# Patient Record
Sex: Female | Born: 1940 | Race: White | Hispanic: No | Marital: Single | Smoking: Never smoker
Health system: Southern US, Community
[De-identification: ages and names within clinical notes are randomized; demographics above are authoritative.]

## PROBLEM LIST (undated history)

## (undated) DIAGNOSIS — R001 Bradycardia, unspecified: Secondary | ICD-10-CM

## (undated) DIAGNOSIS — I10 Essential (primary) hypertension: Secondary | ICD-10-CM

## (undated) DIAGNOSIS — R011 Cardiac murmur, unspecified: Secondary | ICD-10-CM

## (undated) DIAGNOSIS — I739 Peripheral vascular disease, unspecified: Secondary | ICD-10-CM

## (undated) DIAGNOSIS — I2510 Atherosclerotic heart disease of native coronary artery without angina pectoris: Secondary | ICD-10-CM

## (undated) DIAGNOSIS — K649 Unspecified hemorrhoids: Secondary | ICD-10-CM

## (undated) DIAGNOSIS — C801 Malignant (primary) neoplasm, unspecified: Secondary | ICD-10-CM

## (undated) DIAGNOSIS — K5792 Diverticulitis of intestine, part unspecified, without perforation or abscess without bleeding: Secondary | ICD-10-CM

## (undated) DIAGNOSIS — K219 Gastro-esophageal reflux disease without esophagitis: Secondary | ICD-10-CM

## (undated) DIAGNOSIS — G459 Transient cerebral ischemic attack, unspecified: Secondary | ICD-10-CM

## (undated) DIAGNOSIS — E039 Hypothyroidism, unspecified: Secondary | ICD-10-CM

## (undated) DIAGNOSIS — M1990 Unspecified osteoarthritis, unspecified site: Secondary | ICD-10-CM

## (undated) HISTORY — PX: WRIST SURGERY: SHX841

## (undated) HISTORY — PX: COLON SURGERY: SHX602

## (undated) HISTORY — PX: ABDOMINAL HYSTERECTOMY: SHX81

## (undated) HISTORY — PX: REPLACEMENT TOTAL KNEE: SUR1224

## (undated) HISTORY — PX: BACK SURGERY: SHX140

## (undated) HISTORY — PX: EYE SURGERY: SHX253

## (undated) HISTORY — PX: CHOLECYSTECTOMY: SHX55

## (undated) HISTORY — PX: OTHER SURGICAL HISTORY: SHX169

## (undated) HISTORY — PX: RECTOCELE REPAIR: SHX761

---

## 2008-05-20 DIAGNOSIS — I10 Essential (primary) hypertension: Secondary | ICD-10-CM | POA: Insufficient documentation

## 2009-09-16 DIAGNOSIS — J329 Chronic sinusitis, unspecified: Secondary | ICD-10-CM | POA: Insufficient documentation

## 2014-02-15 DIAGNOSIS — K219 Gastro-esophageal reflux disease without esophagitis: Secondary | ICD-10-CM | POA: Insufficient documentation

## 2014-03-01 DIAGNOSIS — M205X2 Other deformities of toe(s) (acquired), left foot: Secondary | ICD-10-CM | POA: Insufficient documentation

## 2014-07-10 DIAGNOSIS — Z96652 Presence of left artificial knee joint: Secondary | ICD-10-CM | POA: Insufficient documentation

## 2014-09-05 DIAGNOSIS — G8929 Other chronic pain: Secondary | ICD-10-CM | POA: Insufficient documentation

## 2015-03-01 DIAGNOSIS — Z6825 Body mass index (BMI) 25.0-25.9, adult: Secondary | ICD-10-CM | POA: Insufficient documentation

## 2015-09-14 DIAGNOSIS — Z96651 Presence of right artificial knee joint: Secondary | ICD-10-CM | POA: Insufficient documentation

## 2016-05-07 DIAGNOSIS — K5732 Diverticulitis of large intestine without perforation or abscess without bleeding: Secondary | ICD-10-CM | POA: Insufficient documentation

## 2017-05-28 DIAGNOSIS — K5900 Constipation, unspecified: Secondary | ICD-10-CM | POA: Insufficient documentation

## 2017-07-05 DIAGNOSIS — R7309 Other abnormal glucose: Secondary | ICD-10-CM | POA: Insufficient documentation

## 2017-07-05 DIAGNOSIS — J019 Acute sinusitis, unspecified: Secondary | ICD-10-CM | POA: Insufficient documentation

## 2017-07-05 DIAGNOSIS — K921 Melena: Secondary | ICD-10-CM | POA: Insufficient documentation

## 2017-07-05 DIAGNOSIS — K5792 Diverticulitis of intestine, part unspecified, without perforation or abscess without bleeding: Secondary | ICD-10-CM | POA: Insufficient documentation

## 2017-07-05 DIAGNOSIS — B372 Candidiasis of skin and nail: Secondary | ICD-10-CM | POA: Insufficient documentation

## 2017-07-06 DIAGNOSIS — J209 Acute bronchitis, unspecified: Secondary | ICD-10-CM | POA: Insufficient documentation

## 2017-07-06 DIAGNOSIS — R140 Abdominal distension (gaseous): Secondary | ICD-10-CM | POA: Insufficient documentation

## 2017-10-08 DIAGNOSIS — L309 Dermatitis, unspecified: Secondary | ICD-10-CM | POA: Insufficient documentation

## 2017-12-15 DIAGNOSIS — K5909 Other constipation: Secondary | ICD-10-CM | POA: Insufficient documentation

## 2018-03-09 IMAGING — US VENOUS DOPPLER ULTRASOUND OF LEFT LOWER EXTREMITY
1 series · 13 of 24 positions shown · non-contrast
Comparison: None.

CLINICAL DATA: Left calf pain for the past 10 days. History of
melanoma. History of prior DVT. Evaluate for acute or chronic DVT.



[Series 1: venous doppler ultrasound of left lower extremity · 0.08mm/px · 13 of 40 slices shown]
[im 1/40]
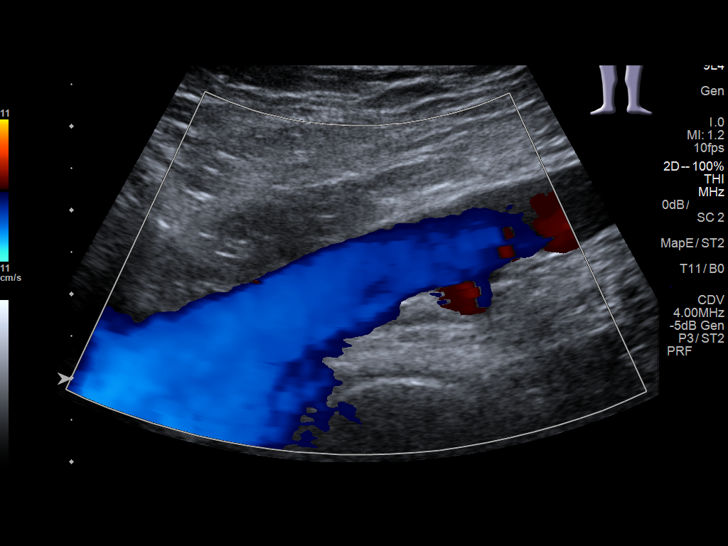
[im 4/40]
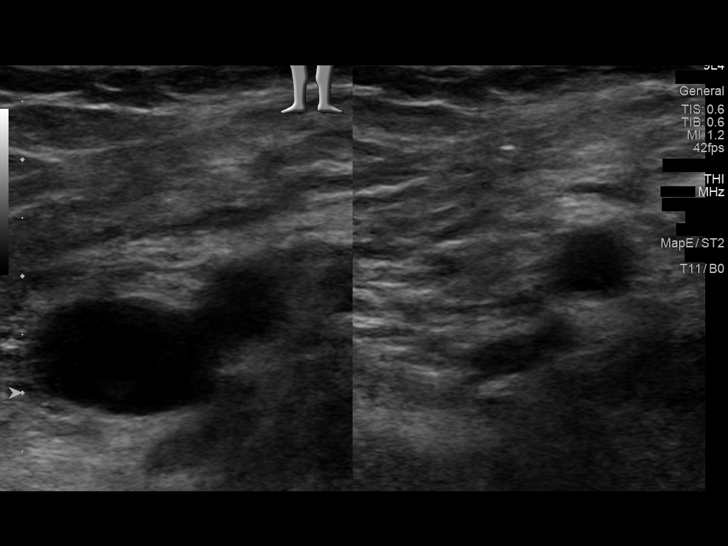
[im 7/40]
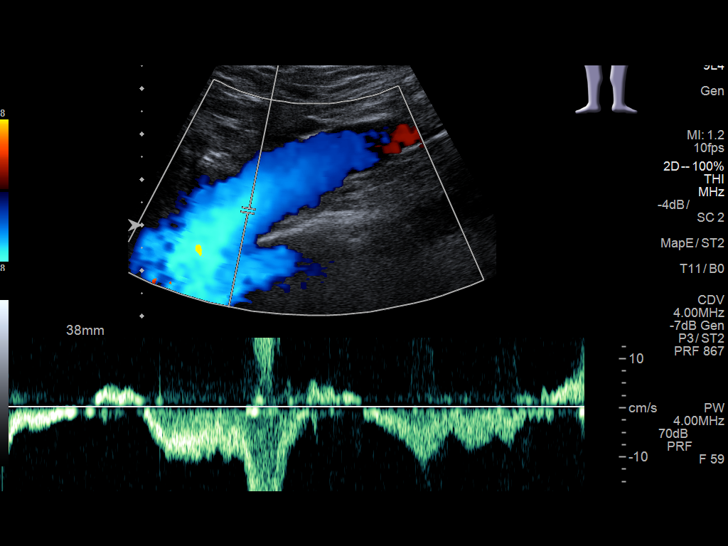
[im 11/40]
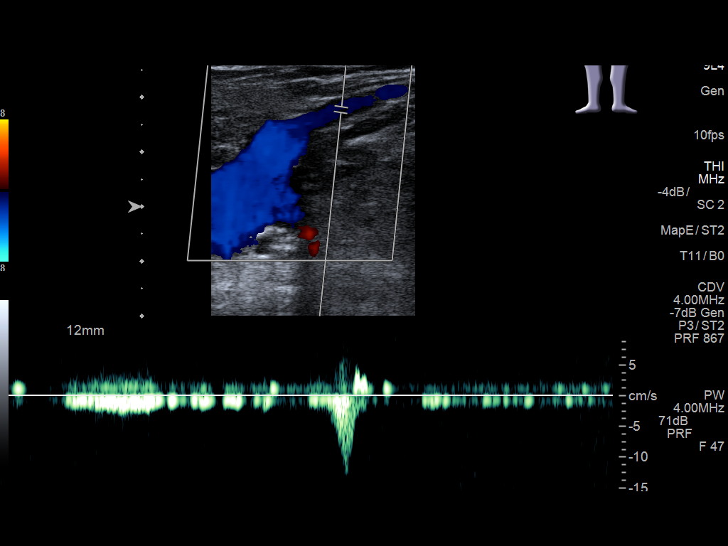
[im 14/40]
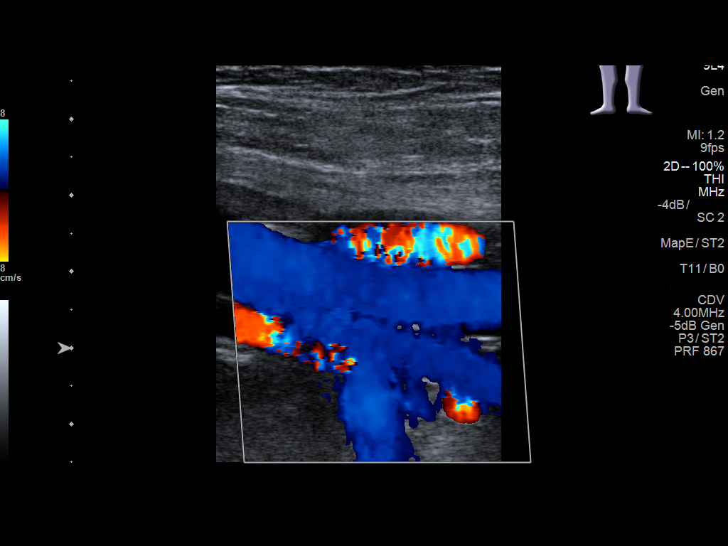
[im 17/40]
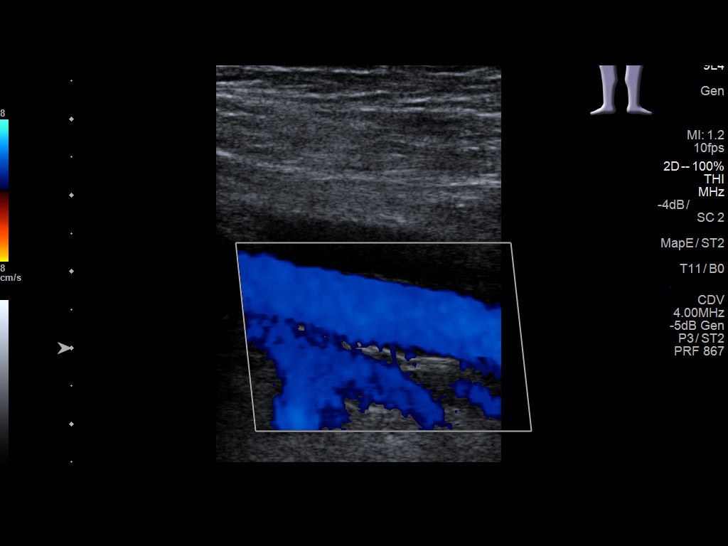
[im 21/40]
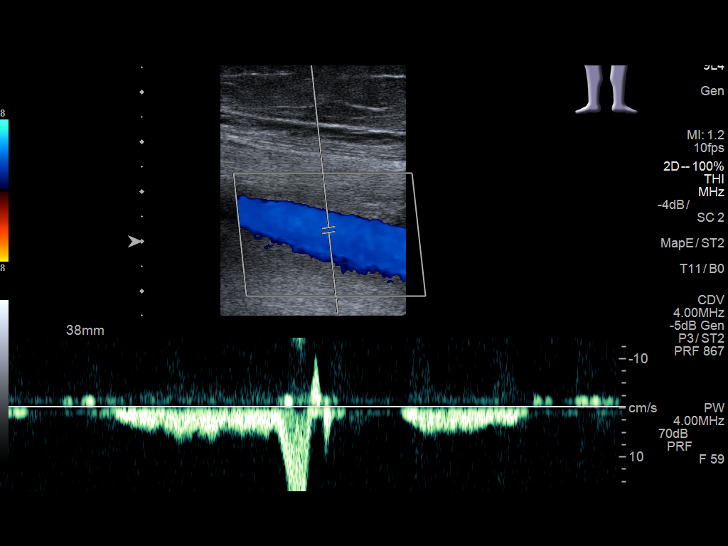
[im 23/40]
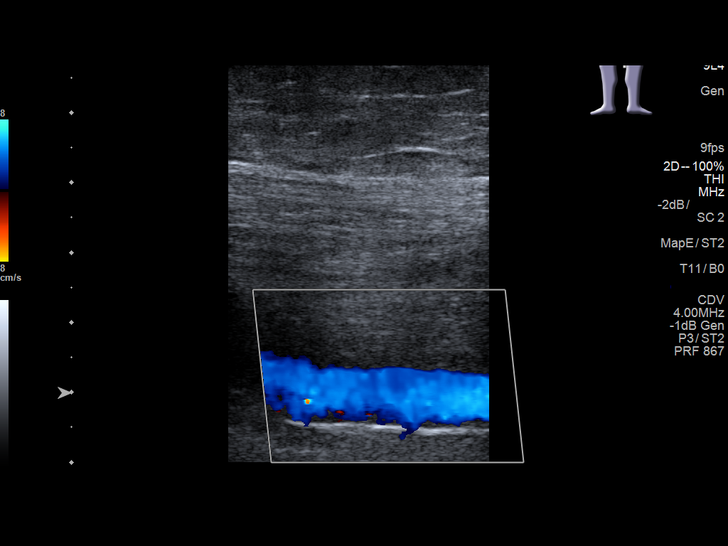
[im 26/40]
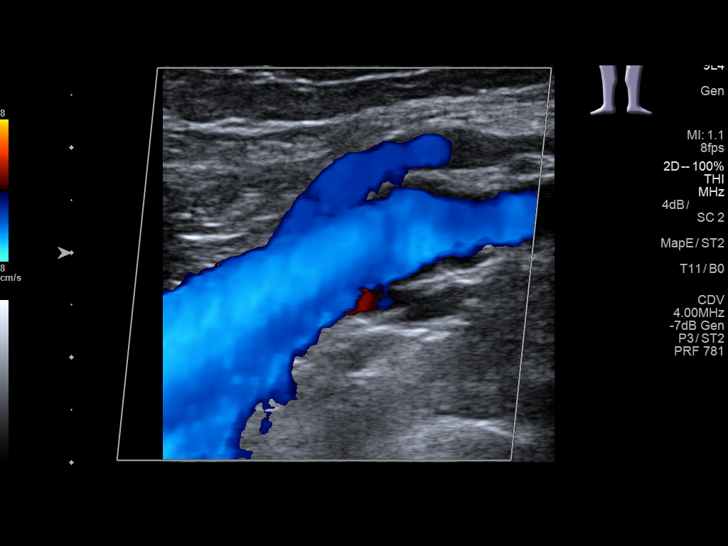
[im 29/40]
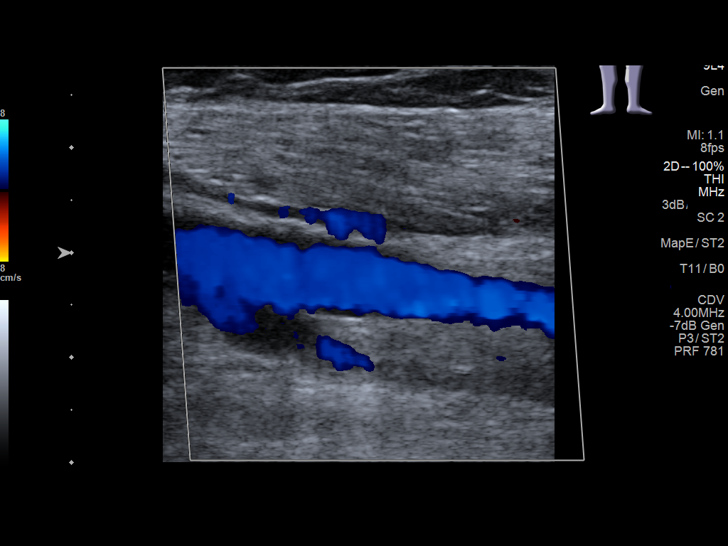
[im 33/40]
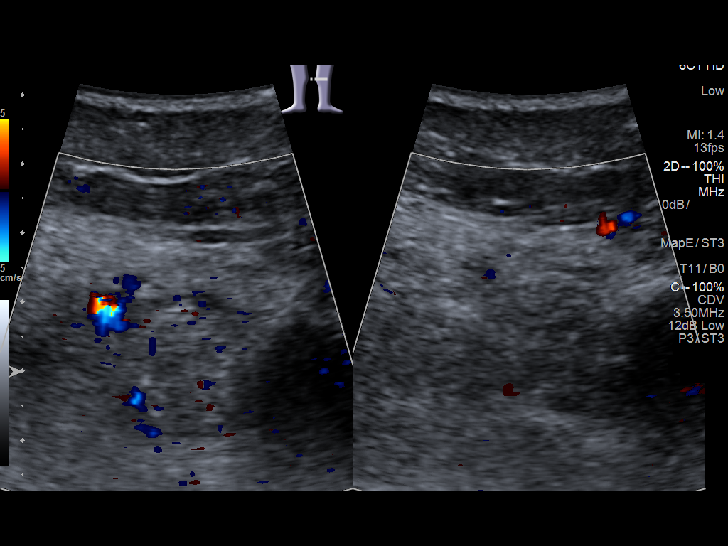
[im 36/40]
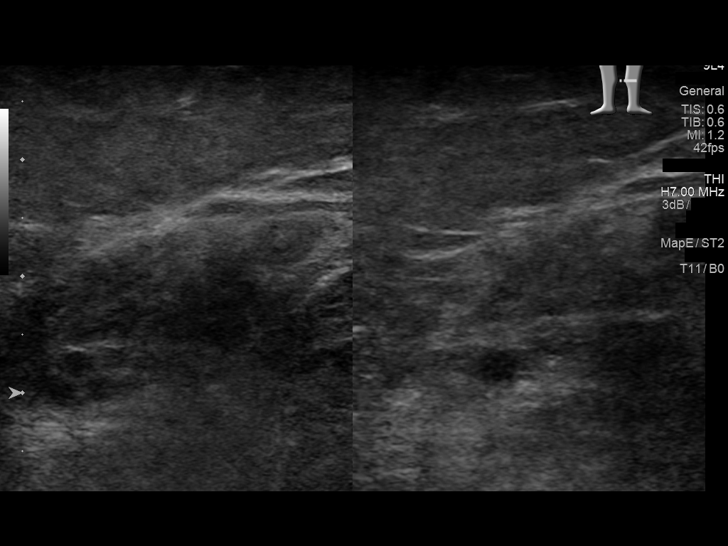
[im 40/40]
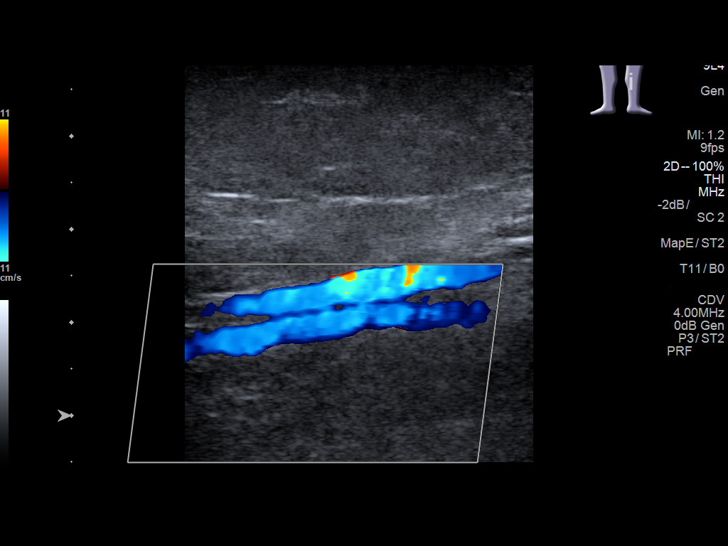

[13 of 24 positions shown; findings below may reference images not displayed]

FINDINGS: Contralateral Common Femoral Vein: Respiratory phasicity is normal
and symmetric with the symptomatic side. No evidence of thrombus.
Normal compressibility.

Common Femoral Vein: No evidence of thrombus. Normal
compressibility, respiratory phasicity and response to augmentation.

Saphenofemoral Junction: No evidence of thrombus. Normal
compressibility and flow on color Doppler imaging.

Profunda Femoral Vein: No evidence of thrombus. Normal
compressibility and flow on color Doppler imaging.

Femoral Vein: No evidence of thrombus. Normal compressibility,
respiratory phasicity and response to augmentation.

Popliteal Vein: No evidence of thrombus. Normal compressibility,
respiratory phasicity and response to augmentation.

Calf Veins: No evidence of thrombus. Normal compressibility and flow
on color Doppler imaging.

Superficial Great Saphenous Vein: No evidence of thrombus. Normal
compressibility.

Venous Reflux:  None.

Other Findings:  None.
IMPRESSION: No evidence of acute or chronic DVT within the left lower extremity.

## 2018-04-20 ENCOUNTER — Emergency Department
Admission: EM | Admit: 2018-04-20 | Discharge: 2018-04-21 | Disposition: A | Discharge status: Home / Self Care | Payer: Medicare Other | Attending: Emergency Medicine | Admitting: Emergency Medicine

## 2018-04-20 ENCOUNTER — Emergency Department: Payer: Medicare Other

## 2018-04-20 ENCOUNTER — Encounter: Payer: Self-pay | Admitting: Emergency Medicine

## 2018-04-20 ENCOUNTER — Other Ambulatory Visit: Payer: Self-pay

## 2018-04-20 DIAGNOSIS — R079 Chest pain, unspecified: Secondary | ICD-10-CM | POA: Insufficient documentation

## 2018-04-20 DIAGNOSIS — I10 Essential (primary) hypertension: Secondary | ICD-10-CM | POA: Insufficient documentation

## 2018-04-20 DIAGNOSIS — K5730 Diverticulosis of large intestine without perforation or abscess without bleeding: Secondary | ICD-10-CM | POA: Insufficient documentation

## 2018-04-20 DIAGNOSIS — R110 Nausea: Secondary | ICD-10-CM | POA: Insufficient documentation

## 2018-04-20 HISTORY — DX: Essential (primary) hypertension: I10

## 2018-04-20 HISTORY — DX: Malignant (primary) neoplasm, unspecified: C80.1

## 2018-04-20 HISTORY — DX: Unspecified osteoarthritis, unspecified site: M19.90

## 2018-04-20 HISTORY — DX: Diverticulitis of intestine, part unspecified, without perforation or abscess without bleeding: K57.92

## 2018-04-20 HISTORY — DX: Bradycardia, unspecified: R00.1

## 2018-04-20 LAB — COMPREHENSIVE METABOLIC PANEL
ALK PHOS: 57 U/L (ref 38–126)
ALT: 12 U/L (ref 0–44)
AST: 22 U/L (ref 15–41)
Albumin: 4.2 g/dL (ref 3.5–5.0)
Anion gap: 7 (ref 5–15)
BUN: 20 mg/dL (ref 8–23)
CO2: 27 mmol/L (ref 22–32)
Calcium: 10.6 mg/dL — ABNORMAL HIGH (ref 8.9–10.3)
Chloride: 106 mmol/L (ref 98–111)
Creatinine, Ser: 0.86 mg/dL (ref 0.44–1.00)
GFR calc non Af Amer: >60 mL/min (ref >60)
Glucose, Bld: 111 mg/dL — ABNORMAL HIGH (ref 70–99)
Potassium: 4.1 mmol/L (ref 3.5–5.1)
SODIUM: 140 mmol/L (ref 135–145)
Total Bilirubin: 0.5 mg/dL (ref 0.3–1.2)
Total Protein: 7.5 g/dL (ref 6.5–8.1)

## 2018-04-20 LAB — CBC WITH DIFFERENTIAL/PLATELET
Abs Immature Granulocytes: 0.01 10*3/uL (ref 0.00–0.07)
Basophils Absolute: 0 10*3/uL (ref 0.0–0.1)
Basophils Relative: 1 %
Eosinophils Absolute: 0.1 10*3/uL (ref 0.0–0.5)
Eosinophils Relative: 1 %
HCT: 41.9 % (ref 36.0–46.0)
Hemoglobin: 13.8 g/dL (ref 12.0–15.0)
Immature Granulocytes: 0 %
Lymphocytes Relative: 32 %
Lymphs Abs: 2 10*3/uL (ref 0.7–4.0)
MCH: 29.9 pg (ref 26.0–34.0)
MCHC: 32.9 g/dL (ref 30.0–36.0)
MCV: 90.7 fL (ref 80.0–100.0)
Monocytes Absolute: 0.6 10*3/uL (ref 0.1–1.0)
Monocytes Relative: 10 %
Neutro Abs: 3.5 10*3/uL (ref 1.7–7.7)
Neutrophils Relative %: 56 %
Platelets: 284 10*3/uL (ref 150–400)
RBC: 4.62 MIL/uL (ref 3.87–5.11)
RDW: 13.3 % (ref 11.5–15.5)
WBC: 6.2 10*3/uL (ref 4.0–10.5)
nRBC: 0 % (ref 0.0–0.2)

## 2018-04-20 LAB — TROPONIN I: Troponin I: <0.03 ng/mL (ref <0.03)

## 2018-04-20 LAB — LIPASE, BLOOD: Lipase: 68 U/L — ABNORMAL HIGH (ref 11–51)

## 2018-04-20 IMAGING — CR DG CHEST 2V
2 series · 2 of 2 positions shown · non-contrast
Comparison: None.

CLINICAL DATA: Mid upper chest pain radiating to the right.

EXAM:
CHEST - 2 VIEW

[chest pa]
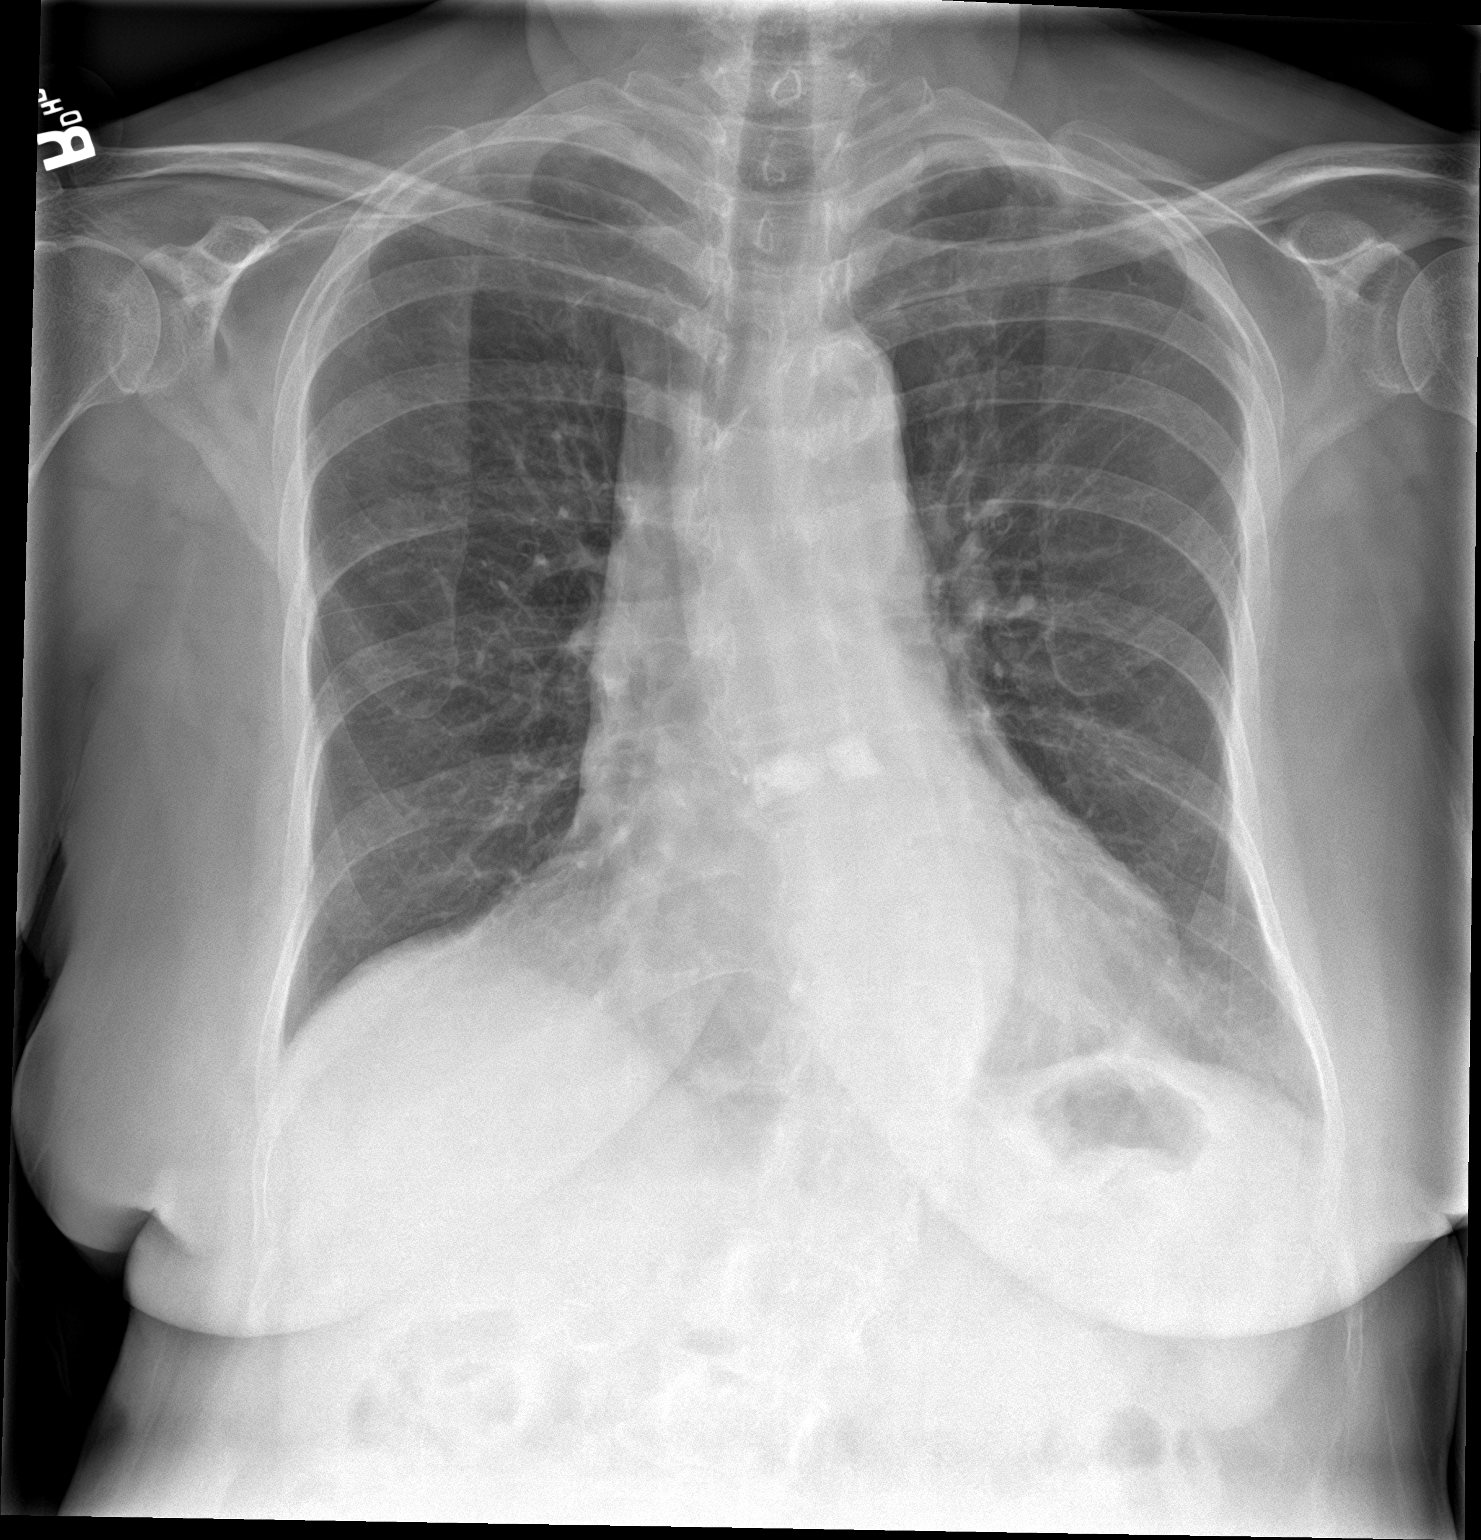

[chest lat]
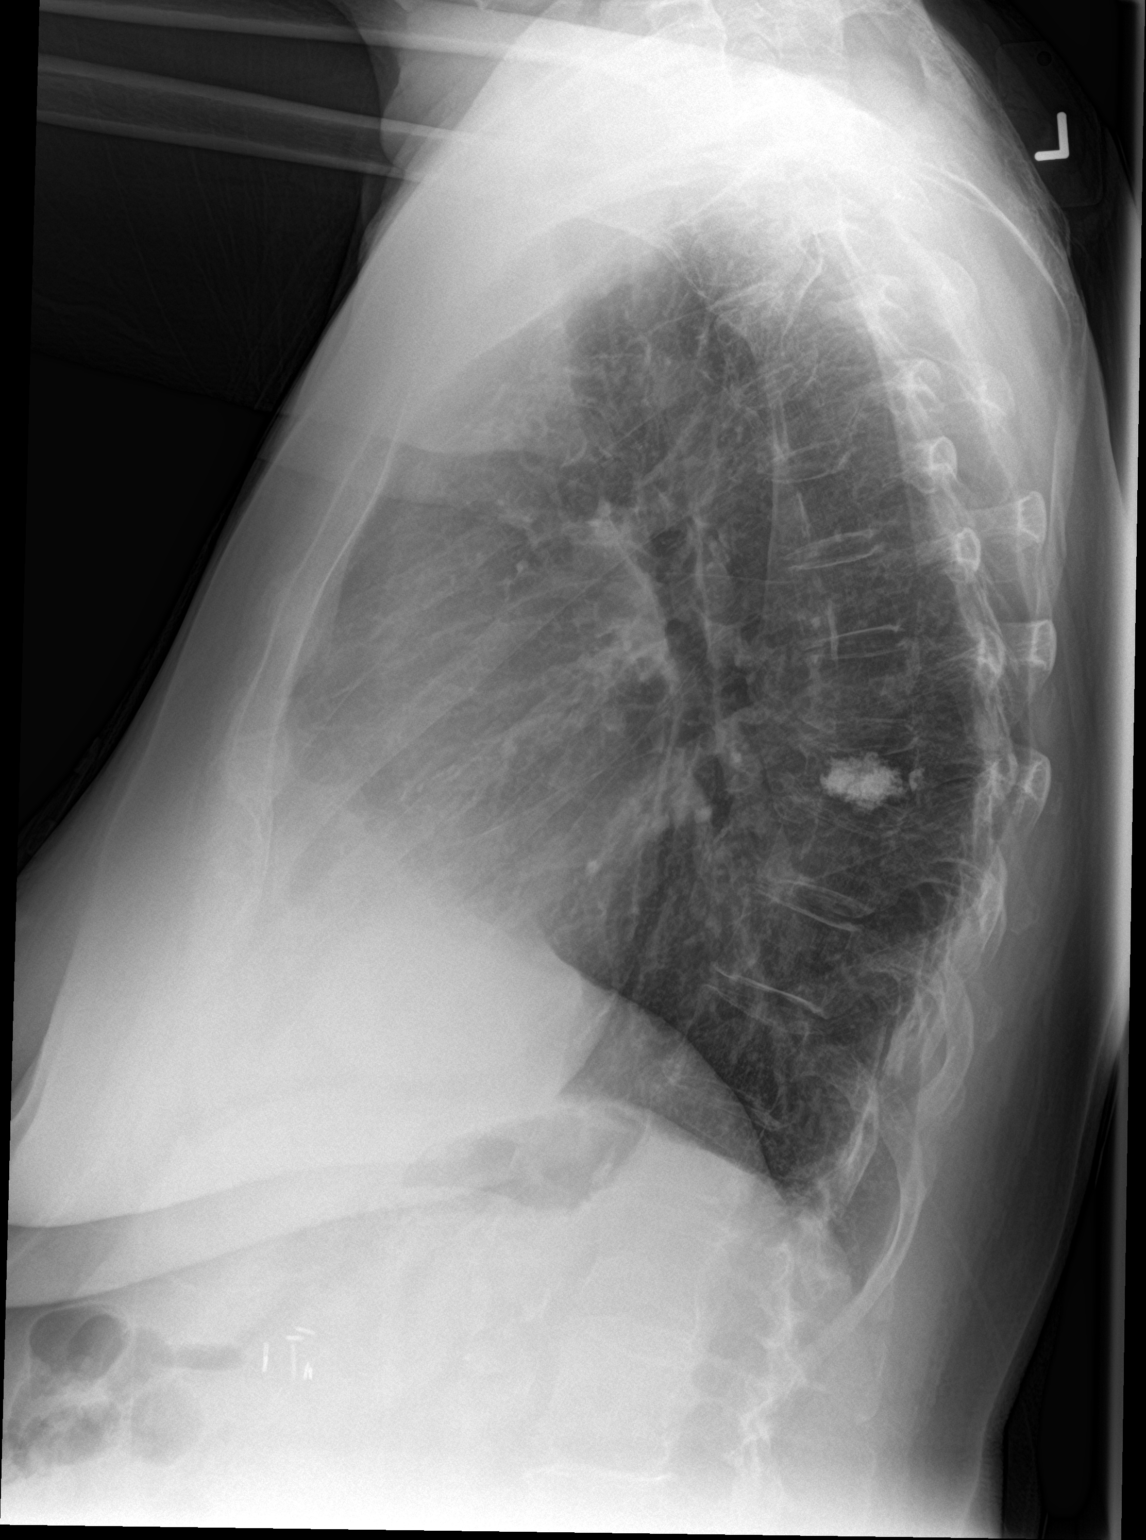

[2 of 2 positions shown; findings below may reference images not displayed]

FINDINGS: Top-normal heart size with tortuous atherosclerotic aorta.
Levoconvex curvature involving the lower thoracic spine with lower
thoracic single level kyphoplasty noted. Lungs are clear.
Cholecystectomy clips are present in the upper abdomen. There is no
free air beneath the diaphragm.
IMPRESSION: 1. No active cardiopulmonary disease. Atherosclerotic nonaneurysmal
thoracic aorta.
2. Levoconvex curvature of the lower thoracic spine with single
lower thoracic kyphoplasty.

## 2018-04-20 IMAGING — CT CT ABD-PELV W/ CM
3 of 11 series · 11 of 46 positions shown, 17 images · IV contrast (APPLIED)
Comparison: Same day CXR

CLINICAL DATA: Mid upper chest pain radiating to the right and into
the back accompanied by nausea and recent diarrhea.

EXAM:
CT ANGIOGRAPHY CHEST
CT ABDOMEN AND PELVIS WITH CONTRAST
TECHNIQUE: Multidetector CT imaging of the chest was performed using the
standard protocol during bolus administration of intravenous
contrast. Multiplanar CT image reconstructions and MIPs were
obtained to evaluate the vascular anatomy. Multidetector CT imaging
of the abdomen and pelvis was performed using the standard protocol
during bolus administration of intravenous contrast.
CONTRAST:  100mL OMNIPAQUE IOHEXOL 350 MG/ML SOLN

[Series 6: thins · axial · 0.66mm/px · z∈[-805,-625]mm · 6 of 290 slices shown]
[im 19/290  soft-tissue]
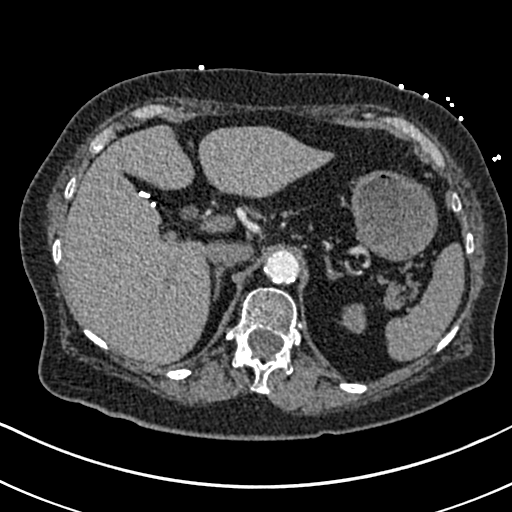
[im 55/290  soft-tissue]
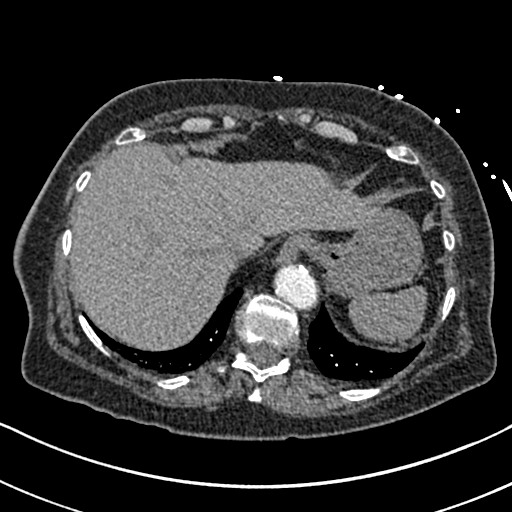
[im 91/290  soft-tissue]
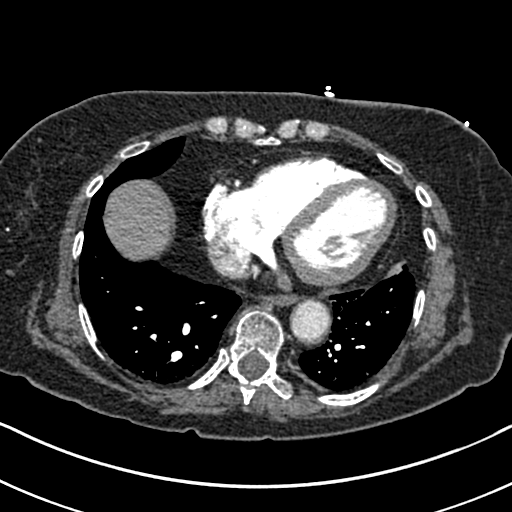
[im 127/290  soft-tissue]
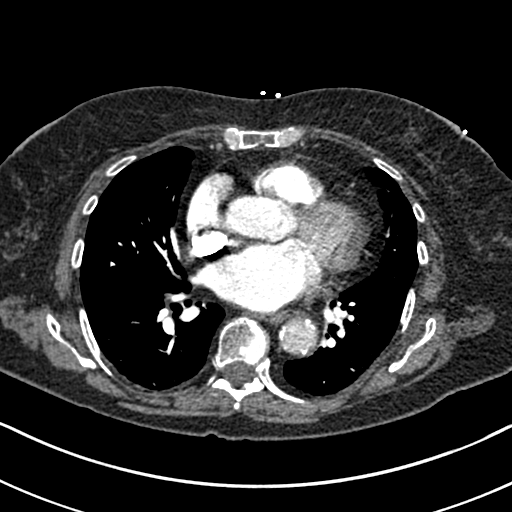
[im 163/290  soft-tissue]
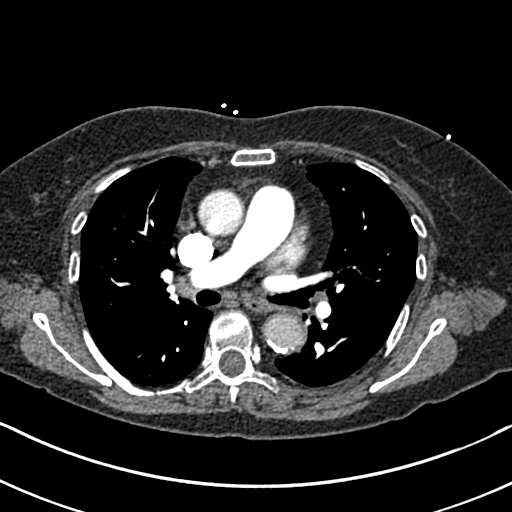
[im 199/290  soft-tissue]
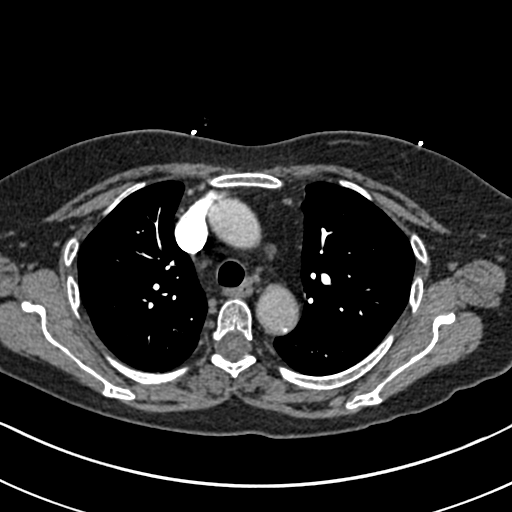

[Series 8: coronal mpr · coronal · 0.65mm/px · 1 of 110 slices shown, 2 images]
[im 55/110  soft-tissue]
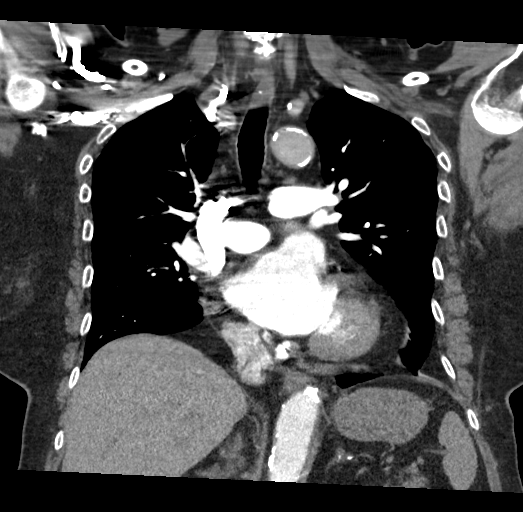
[im 55/110  bone]
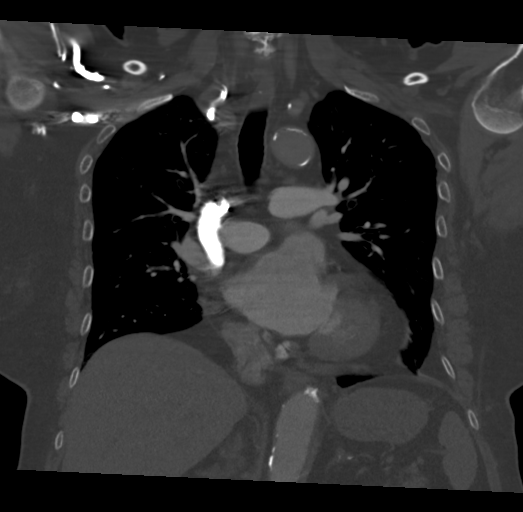

[Series 12: axial st · axial · 0.78mm/px · z∈[-1070,-790]mm · 4 of 94 slices shown, 9 images]
[im 19/94  soft-tissue]
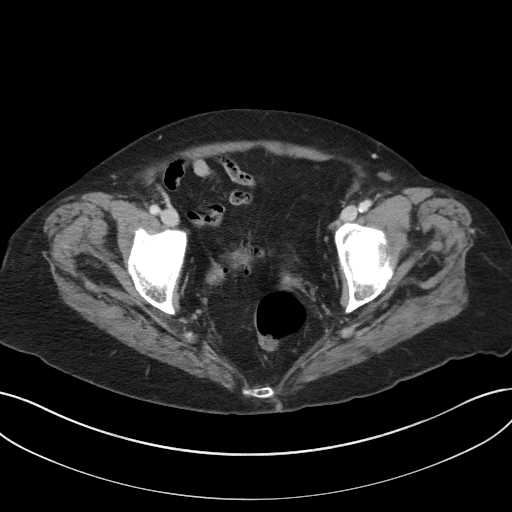
[im 19/94  lung]
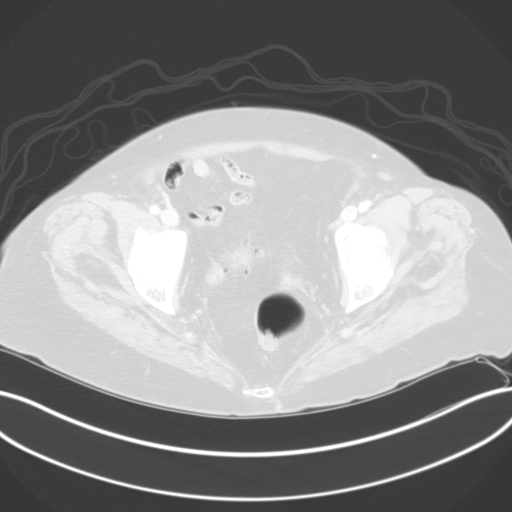
[im 19/94  bone]
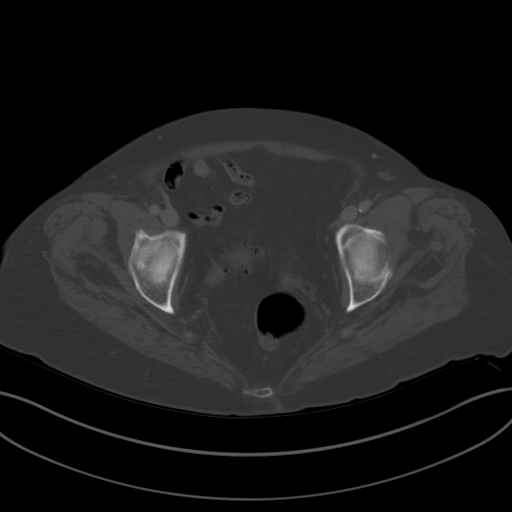
[im 38/94  soft-tissue]
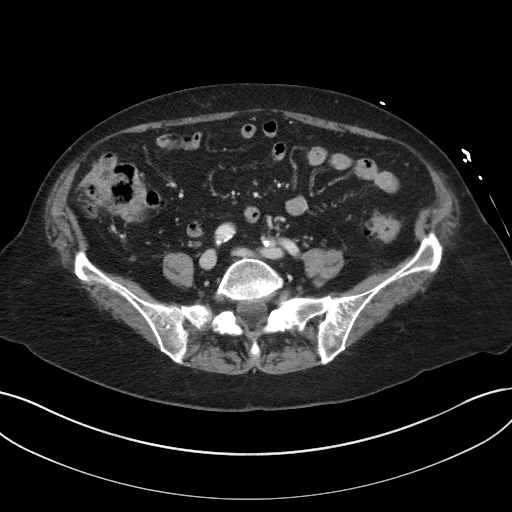
[im 38/94  lung]
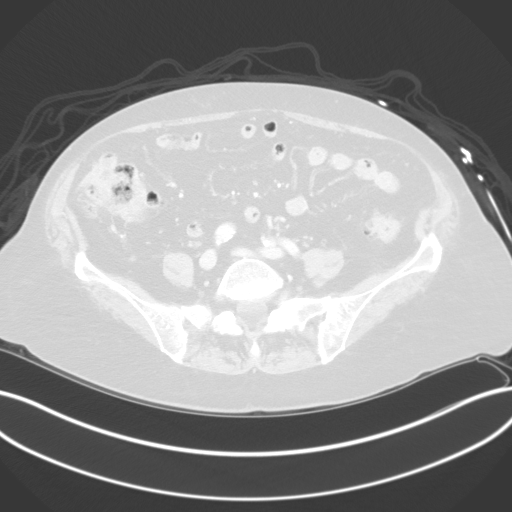
[im 56/94  soft-tissue]
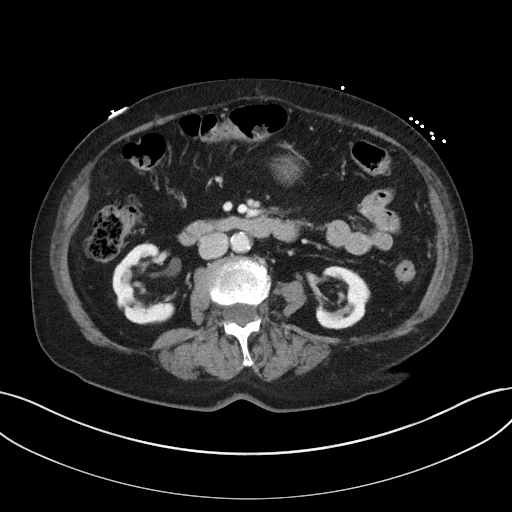
[im 56/94  lung]
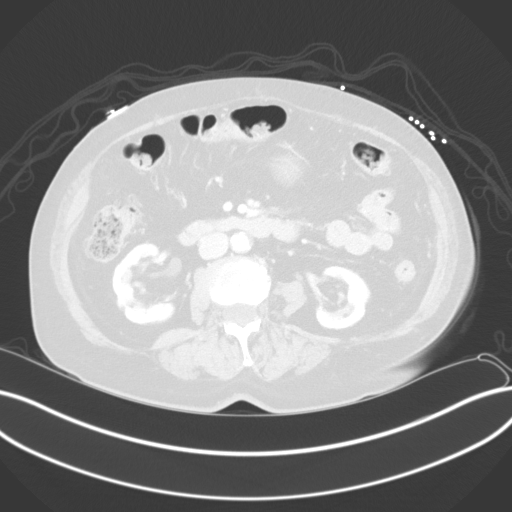
[im 75/94  soft-tissue]
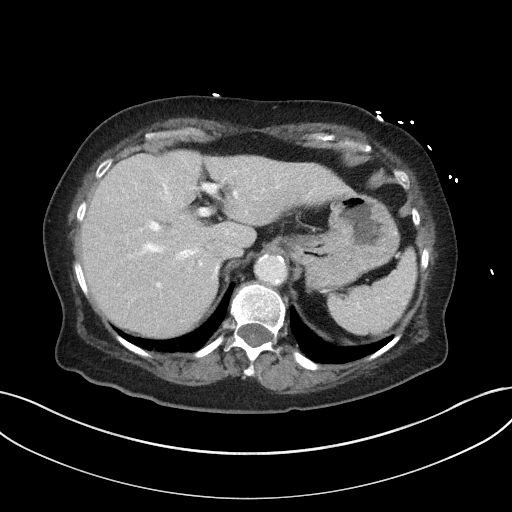
[im 75/94  lung]
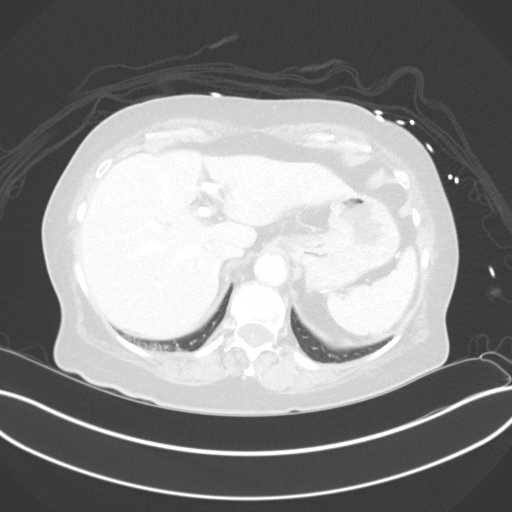

[11 of 46 positions shown; findings below may reference images not displayed]

FINDINGS: CTA CHEST FINDINGS

Cardiovascular: Cardiomegaly without pericardial effusion.
Atherosclerosis of the thoracic aorta without aneurysm or
dissection. Satisfactory opacification of the pulmonary arteries to
the segmental level without acute pulmonary embolus. Left main and
three-vessel coronary arteriosclerosis is noted. Atherosclerosis of
the great vessels.

Mediastinum/Nodes: No enlarged mediastinal, hilar, or axillary lymph
nodes. Thyroid gland, trachea, and esophagus demonstrate no
significant findings.

Lungs/Pleura: Dependent atelectasis at the lung bases. No acute
pulmonary consolidation, effusion or pneumothorax. Apical
pleuroparenchymal scarring bilaterally.

Musculoskeletal: T8 kyphoplasty. Osteopenic appearance of the
thoracic spine with mild levoconvex curvature of the lower thoracic
spine.

Review of the MIP images confirms the above findings.

CT ABDOMEN and PELVIS FINDINGS

Hepatobiliary: No focal liver abnormality is seen. Status post
cholecystectomy. No biliary dilatation.

Pancreas: Atrophic without ductal dilatation, mass or inflammation.

Spleen: Normal in size without focal abnormality.

Adrenals/Urinary Tract: Normal bilateral adrenal glands. Small cysts
of both kidneys without obstructive uropathy or suspicious enhancing
lesions. The urinary bladder is unremarkable for the degree of
distention.

Stomach/Bowel: Descending and sigmoid colonic diverticulosis without
acute diverticulitis. No bowel obstruction or inflammation.
Physiologic distention of the stomach with normal small bowel
rotation is noted. Normal appearing appendix is visualized.

Vascular/Lymphatic: Aortoiliac atherosclerosis. No aneurysm or
adenopathy.

Reproductive: Hysterectomy.  No adnexal mass.

Other: No free air nor free fluid.  No abdominal wall hernia.

Musculoskeletal: Dextroconvex curvature of the lumbar spine. Acute
nor aggressive osseous lesions. Osteoarthritic joint space narrowing
of both hips.

Review of the MIP images confirms the above findings.
IMPRESSION: Chest CT:

1. Aortic atherosclerosis without aneurysm or dissection.
2. No acute pulmonary embolus.
3. Cardiomegaly with coronary arteriosclerosis.
4. T8 kyphoplasty. Osteopenic appearance of the thoracic spine
without acute osseous abnormality. Levoscoliosis of the lower
thoracic spine.

CT AP:

1. Descending and sigmoid diverticulosis without acute
diverticulitis.
2. Aortoiliac atherosclerosis without aneurysm.
3. Small cysts of both kidneys.  No obstructive uropathy.

## 2018-04-20 MED ORDER — IOHEXOL 350 MG/ML SOLN
100.0000 mL | Freq: Once | INTRAVENOUS | Status: AC | PRN
  Administered 2018-04-20: 100 mL via INTRAVENOUS

## 2018-04-20 NOTE — ED Provider Notes (Signed)
[facility] Emergency Department Provider Note  ____________________________________________  Time seen: Approximately 9:37 PM  I have reviewed the triage vital signs and the nursing notes.   HISTORY  Chief Complaint Chest Pain   HPI Laura Cole is a 77 y.o. female with a history of hypertension, bradycardia, diverticulitis, and arthritis who presents for evaluation of chest pain.  Patient reports sudden onset of sharp pain located in the center of her chest and radiating to the back.  The pain was severe and lasted several minutes.  The pain then resolved but recurred 2 more times this evening which prompted her visit to the emergency room.  She reports feeling mild nausea with it but no vomiting, no diaphoresis, no shortness of breath or dizziness.  Patient reports having 2 episodes of diarrhea over the last 2 days but that has resolved.  No melena.  Patient has strong family history of heart disease but no personal history of such.  No history of smoking.  No drinking.  Patient has had several prior abdominal surgeries including cholecystectomy and a hysterectomy.  At this time she has no pain.  She has had prior DVTs in the past but is currently not on any anticoagulation.  She denies leg pain or swelling, hemoptysis, recent travel immobilization.   Past Medical History:  Diagnosis Date  . Bradycardia   . Cancer ([place])    skin cancer  . Diverticulitis   . Hypertension   . Osteoarthritis     There are no active problems to display for this patient.   Past Surgical History:  Procedure Laterality Date  . ABDOMINAL HYSTERECTOMY    . BACK SURGERY    . CHOLECYSTECTOMY    . RECTOCELE REPAIR    . REPLACEMENT TOTAL KNEE Bilateral   . WRIST SURGERY      Prior to Admission medications   Not on File    Allergies Dilaudid [hydromorphone hcl]; Phenergan [promethazine hcl]; and Propofol  No family history on file.  Social History Social History    Tobacco Use  . Smoking status: Never Smoker  . Smokeless tobacco: Never Used  Substance Use Topics  . Alcohol use: Not on file  . Drug use: Not on file    Review of Systems  Constitutional: Negative for fever. Eyes: Negative for visual changes. ENT: Negative for sore throat. Neck: No neck pain  Cardiovascular: + chest pain. Respiratory: Negative for shortness of breath. Gastrointestinal: Negative for abdominal pain, vomiting. + nausea, diarrhea Genitourinary: Negative for dysuria. Musculoskeletal: Negative for back pain. Skin: Negative for rash. Neurological: Negative for headaches, weakness or numbness. Psych: No SI or HI  ____________________________________________   PHYSICAL EXAM:  VITAL SIGNS: ED Triage Vitals  Enc Vitals Group     BP 04/20/18 2017 (!) 159/73     Pulse Rate 04/20/18 2017 (!) 59     Resp 04/20/18 2017 18     Temp --      Temp Source 04/20/18 2017 Oral     SpO2 04/20/18 2017 100 %     Weight 04/20/18 2017 168 lb (76.2 kg)     Height 04/20/18 2017 5\' 6"  (1.676 m)     Head Circumference --      Peak Flow --      Pain Score 04/20/18 2016 3     Pain Loc --      Pain Edu? --      Excl. in [place]? --     Constitutional: Alert and oriented.  Well appearing and in no apparent distress. HEENT:      Head: Normocephalic and atraumatic.         Eyes: Conjunctivae are normal. Sclera is non-icteric.       Mouth/Throat: Mucous membranes are moist.       Neck: Supple with no signs of meningismus. Cardiovascular: Regular rate and rhythm. No murmurs, gallops, or rubs. 2+ symmetrical distal pulses are present in all extremities. No JVD. Respiratory: Normal respiratory effort. Lungs are clear to auscultation bilaterally. No wheezes, crackles, or rhonchi.  Gastrointestinal: Soft, non tender, and non distended with positive bowel sounds. No rebound or guarding. Genitourinary: No CVA tenderness. Musculoskeletal: Nontender with normal range of motion in all  extremities. No edema, cyanosis, or erythema of extremities. Neurologic: Normal speech and language. Face is symmetric. Moving all extremities. No gross focal neurologic deficits are appreciated. Skin: Skin is warm, dry and intact. No rash noted. Psychiatric: Mood and affect are normal. Speech and behavior are normal.  ____________________________________________   LABS (all labs ordered are listed, but only abnormal results are displayed)  Labs Reviewed  COMPREHENSIVE METABOLIC PANEL - Abnormal; Notable for the following components:      Result Value   Glucose, Bld 111 (*)    Calcium 10.6 (*)    All other components within normal limits  LIPASE, BLOOD - Abnormal; Notable for the following components:   Lipase 68 (*)    All other components within normal limits  CBC WITH DIFFERENTIAL/PLATELET  TROPONIN I  TROPONIN I   ____________________________________________  EKG  ED ECG REPORT I, Rudene Re, the attending physician, personally viewed and interpreted this ECG.  Sinus bradycardia, rate of 56, normal intervals, normal axis, no ST elevations or depressions.  Unchanged from prior.     ____________________________________________  RADIOLOGY  I have personally reviewed the images performed during this visit and I agree with the Radiologist's read.   Interpretation by Radiologist:  Dg Chest 2 View  Result Date: 04/20/2018 CLINICAL DATA:  Mid upper chest pain radiating to the right. EXAM: CHEST - 2 VIEW COMPARISON:  None. FINDINGS: Top-normal heart size with tortuous atherosclerotic aorta. Levoconvex curvature involving the lower thoracic spine with lower thoracic single level kyphoplasty noted. Lungs are clear. Cholecystectomy clips are present in the upper abdomen. There is no free air beneath the diaphragm. IMPRESSION: 1. No active cardiopulmonary disease. Atherosclerotic nonaneurysmal thoracic aorta. 2. 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 Electronically Signed   By: Ashley Royalty M.D.   On: 04/20/2018 20:45   Ct Angio Chest Pe W And/or Wo Contrast  Result Date: 04/20/2018 CLINICAL DATA:  Mid upper chest pain radiating to the right and into the back accompanied by nausea and recent diarrhea. EXAM: CT ANGIOGRAPHY CHEST CT ABDOMEN AND PELVIS WITH CONTRAST TECHNIQUE: Multidetector CT imaging of the chest was performed using the standard protocol during bolus administration of intravenous contrast. Multiplanar CT image reconstructions and MIPs were obtained to evaluate the vascular anatomy. Multidetector CT imaging of the abdomen and pelvis was performed using the standard protocol during bolus administration of intravenous contrast. CONTRAST:  143mL OMNIPAQUE IOHEXOL 350 MG/ML SOLN COMPARISON:  Same day CXR FINDINGS: CTA CHEST FINDINGS Cardiovascular: Cardiomegaly without pericardial effusion. Atherosclerosis of the thoracic aorta without aneurysm or dissection. Satisfactory opacification of the pulmonary arteries to the segmental level without acute pulmonary embolus. Left main and three-vessel coronary arteriosclerosis is noted. Atherosclerosis of the great vessels. Mediastinum/Nodes: No enlarged mediastinal, hilar, or axillary  lymph nodes. Thyroid gland, trachea, and esophagus demonstrate no significant findings. Lungs/Pleura: Dependent atelectasis at the lung bases. No acute pulmonary consolidation, effusion or pneumothorax. Apical pleuroparenchymal scarring bilaterally. Musculoskeletal: T8 kyphoplasty. Osteopenic appearance of the thoracic spine with mild levoconvex curvature of the lower thoracic spine. Review of the MIP images confirms the above findings. CT ABDOMEN and PELVIS FINDINGS Hepatobiliary: No focal liver abnormality is seen. Status post cholecystectomy. No biliary dilatation. Pancreas: Atrophic without ductal dilatation, mass or inflammation. Spleen: Normal in size without focal abnormality.  Adrenals/Urinary Tract: Normal bilateral adrenal glands. Small cysts of both kidneys without obstructive uropathy or suspicious enhancing lesions. The urinary bladder is unremarkable for the degree of distention. Stomach/Bowel: Descending and sigmoid colonic diverticulosis without acute diverticulitis. No bowel obstruction or inflammation. Physiologic distention of the stomach with normal small bowel rotation is noted. Normal appearing appendix is visualized. Vascular/Lymphatic: Aortoiliac atherosclerosis. No aneurysm or adenopathy. Reproductive: Hysterectomy.  No adnexal mass. Other: No free air nor free fluid.  No abdominal wall hernia. Musculoskeletal: Dextroconvex curvature of the lumbar spine. Acute nor aggressive osseous lesions. Osteoarthritic joint space narrowing of both hips. Review of the MIP images confirms the above findings. IMPRESSION: Chest CT: 1. Aortic atherosclerosis without aneurysm or dissection. 2. No acute pulmonary embolus. 3. Cardiomegaly with coronary arteriosclerosis. 4. T8 kyphoplasty. Osteopenic appearance of the thoracic spine without acute osseous abnormality. Levoscoliosis of the lower thoracic spine. CT AP: 1. Descending and sigmoid diverticulosis without acute diverticulitis. 2. Aortoiliac atherosclerosis without aneurysm. 3. Small cysts of both kidneys.  No obstructive uropathy. Electronically Signed   By: Ashley Royalty M.D.   On: 04/20/2018 23:11   Ct Abdomen Pelvis W Contrast  Result Date: 04/20/2018 CLINICAL DATA:  Mid upper chest pain radiating to the right and into the back accompanied by nausea and recent diarrhea. EXAM: CT ANGIOGRAPHY CHEST CT ABDOMEN AND PELVIS WITH CONTRAST TECHNIQUE: Multidetector CT imaging of the chest was performed using the standard protocol during bolus administration of intravenous contrast. Multiplanar CT image reconstructions and MIPs were obtained to evaluate the vascular anatomy. Multidetector CT imaging of the abdomen and pelvis was performed  using the standard protocol during bolus administration of intravenous contrast. CONTRAST:  18mL OMNIPAQUE IOHEXOL 350 MG/ML SOLN COMPARISON:  Same day CXR FINDINGS: CTA CHEST FINDINGS Cardiovascular: Cardiomegaly without pericardial effusion. Atherosclerosis of the thoracic aorta without aneurysm or dissection. Satisfactory opacification of the pulmonary arteries to the segmental level without acute pulmonary embolus. Left main and three-vessel coronary arteriosclerosis is noted. Atherosclerosis of the great vessels. Mediastinum/Nodes: No enlarged mediastinal, hilar, or axillary lymph nodes. Thyroid gland, trachea, and esophagus demonstrate no significant findings. Lungs/Pleura: Dependent atelectasis at the lung bases. No acute pulmonary consolidation, effusion or pneumothorax. Apical pleuroparenchymal scarring bilaterally. Musculoskeletal: T8 kyphoplasty. Osteopenic appearance of the thoracic spine with mild levoconvex curvature of the lower thoracic spine. Review of the MIP images confirms the above findings. CT ABDOMEN and PELVIS FINDINGS Hepatobiliary: No focal liver abnormality is seen. Status post cholecystectomy. No biliary dilatation. Pancreas: Atrophic without ductal dilatation, mass or inflammation. Spleen: Normal in size without focal abnormality. Adrenals/Urinary Tract: Normal bilateral adrenal glands. Small cysts of both kidneys without obstructive uropathy or suspicious enhancing lesions. The urinary bladder is unremarkable for the degree of distention. Stomach/Bowel: Descending and sigmoid colonic diverticulosis without acute diverticulitis. No bowel obstruction or inflammation. Physiologic distention of the stomach with normal small bowel rotation is noted. Normal appearing appendix is visualized. Vascular/Lymphatic: Aortoiliac atherosclerosis. No aneurysm or adenopathy. Reproductive: Hysterectomy.  No adnexal  mass. Other: No free air nor free fluid.  No abdominal wall hernia. Musculoskeletal:  Dextroconvex curvature of the lumbar spine. Acute nor aggressive osseous lesions. Osteoarthritic joint space narrowing of both hips. Review of the MIP images confirms the above findings. IMPRESSION: Chest CT: 1. Aortic atherosclerosis without aneurysm or dissection. 2. No acute pulmonary embolus. 3. Cardiomegaly with coronary arteriosclerosis. 4. T8 kyphoplasty. Osteopenic appearance of the thoracic spine without acute osseous abnormality. Levoscoliosis of the lower thoracic spine. CT AP: 1. Descending and sigmoid diverticulosis without acute diverticulitis. 2. Aortoiliac atherosclerosis without aneurysm. 3. Small cysts of both kidneys.  No obstructive uropathy. Electronically Signed   By: Ashley Royalty M.D.   On: 04/20/2018 23:11     ____________________________________________   PROCEDURES  Procedure(s) performed: None Procedures Critical Care performed:  None ____________________________________________   INITIAL IMPRESSION / ASSESSMENT AND PLAN / ED COURSE   77 y.o. female with a history of hypertension, bradycardia, diverticulitis, and arthritis who presents for evaluation of sudden onset severe sharp central chest pain radiating to the back associated with nausea intermittent since 3PM.  Currently no pain.  Patient is neurologically intact, normal vital signs, strong pulses in all 4 extremities, no palpable pulsatile mass, abdomen is soft with no tenderness.  EKG showing no ischemic changes.  Differential diagnosis is broad and includes GERD versus pancreatitis versus retained stone in the common bile duct versus SBO versus ACS versus PE versus dissection.  Patient is neurologically intact with normal blood pressure making dissection less likely.  Labs show normal white count, normal hemoglobin, normal CMP.  Calcium is elevated at 10.6.  Lipase elevated at 68.  We will send patient for CT chest abdomen pelvis to evaluate for malignancy versus PE versus pancreatitis versus SBO.  Will get second  troponin.  Clinical Course as of Apr 20 2353  Tue Apr 20, 2018  2353 CT showing no acute findings. 2nd troponin is pending.  Patient remains with no further episodes of chest pain.  Plan to discharge home if second troponin is negative.  Care transferred to Dr. Mable Paris.   [CV]    Clinical Course User Index [CV] Alfred Levins [place], MD     As part of my medical decision making, I reviewed the following data within the [facility] notes reviewed and incorporated, Labs reviewed , EKG interpreted , Old EKG reviewed, Old chart reviewed, Radiograph reviewed , Notes from prior ED visits and [place] Controlled Substance Database    Pertinent labs & imaging results that were available during my care of the patient were reviewed by me and considered in my medical decision making (see chart for details).    ____________________________________________   FINAL CLINICAL IMPRESSION(S) / ED DIAGNOSES  Final diagnoses:  Chest pain, unspecified type      NEW MEDICATIONS STARTED DURING THIS VISIT:  ED Discharge Orders    None       Note:  This document was prepared using Dragon voice recognition software and may include unintentional dictation errors.    Alfred Levins, [place], MD 04/21/18 0000

## 2018-04-20 NOTE — Discharge Instructions (Addendum)
You were seen for chest pain. Your workup today was reassuring. As I explained to you that does not mean that you do not have heart disease. You may need further evaluation to ensure you do not have a serious heart problem. Therefore it is imperative that you follow up with your doctor in 1-2 days for further evaluation.   When should you call for help?  Call 911 if: You passed out (lost consciousness) or if you feel dizzy. You have difficulty breathing. You have symptoms of a heart attack. These may include: Chest pain or pressure, or a strange feeling in your chest. Indigestion. Sweating. Shortness of breath. Nausea or vomiting. Pain, pressure, or a strange feeling in your back, neck, jaw, or upper belly or in one or both shoulders or arms. Lightheadedness or sudden weakness. A fast or irregular heartbeat. After you call 911, the operator may tell you to chew 1 adult-strength or 2 to 4 low-dose aspirin. Wait for an ambulance. Do not try to drive yourself.   Call your doctor today if: You have any trouble breathing. Your chest pain gets worse. You are dizzy or lightheaded, or you feel like you may faint. You are not getting better as expected. You are having new or different chest pain  How can you care for yourself at home? Rest until you feel better. Take your medicine exactly as prescribed. Call your doctor if you think you are having a problem with your medicine. Do not drive after taking a prescription pain medicine.  Results for orders placed or performed during the hospital encounter of 04/20/18  CBC with Differential  Result Value Ref Range   WBC 6.2 4.0 - 10.5 K/uL   RBC 4.62 3.87 - 5.11 MIL/uL   Hemoglobin 13.8 12.0 - 15.0 g/dL   HCT 41.9 36.0 - 46.0 %   MCV 90.7 80.0 - 100.0 fL   MCH 29.9 26.0 - 34.0 pg   MCHC 32.9 30.0 - 36.0 g/dL   RDW 13.3 11.5 - 15.5 %   Platelets 284 150 - 400 K/uL   nRBC 0.0 0.0 - 0.2 %   Neutrophils Relative % 56 %   Neutro Abs 3.5 1.7  - 7.7 K/uL   Lymphocytes Relative 32 %   Lymphs Abs 2.0 0.7 - 4.0 K/uL   Monocytes Relative 10 %   Monocytes Absolute 0.6 0.1 - 1.0 K/uL   Eosinophils Relative 1 %   Eosinophils Absolute 0.1 0.0 - 0.5 K/uL   Basophils Relative 1 %   Basophils Absolute 0.0 0.0 - 0.1 K/uL   Immature Granulocytes 0 %   Abs Immature Granulocytes 0.01 0.00 - 0.07 K/uL  Comprehensive metabolic panel  Result Value Ref Range   Sodium 140 135 - 145 mmol/L   Potassium 4.1 3.5 - 5.1 mmol/L   Chloride 106 98 - 111 mmol/L   CO2 27 22 - 32 mmol/L   Glucose, Bld 111 (H) 70 - 99 mg/dL   BUN 20 8 - 23 mg/dL   Creatinine, Ser 0.86 0.44 - 1.00 mg/dL   Calcium 10.6 (H) 8.9 - 10.3 mg/dL   Total Protein 7.5 6.5 - 8.1 g/dL   Albumin 4.2 3.5 - 5.0 g/dL   AST 22 15 - 41 U/L   ALT 12 0 - 44 U/L   Alkaline Phosphatase 57 38 - 126 U/L   Total Bilirubin 0.5 0.3 - 1.2 mg/dL   GFR calc non Af Amer >60 >60 mL/min   GFR calc Af Amer >  60 >60 mL/min   Anion gap 7 5 - 15  Lipase, blood  Result Value Ref Range   Lipase 68 (H) 11 - 51 U/L  Troponin I - ONCE - STAT  Result Value Ref Range   Troponin I <0.03 <0.03 ng/mL  Troponin I - Once-Timed  Result Value Ref Range   Troponin I <0.03 <0.03 ng/mL   Dg Chest 2 View  Result Date: 04/20/2018 CLINICAL DATA:  Mid upper chest pain radiating to the right. EXAM: CHEST - 2 VIEW COMPARISON:  None. FINDINGS: Top-normal heart size with tortuous atherosclerotic aorta. Levoconvex curvature involving the lower thoracic spine with lower thoracic single level kyphoplasty noted. Lungs are clear. Cholecystectomy clips are present in the upper abdomen. There is no free air beneath the diaphragm. IMPRESSION: 1. No active cardiopulmonary disease. Atherosclerotic nonaneurysmal thoracic aorta. 2. 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 Electronically Signed   By: Ashley Royalty M.D.   On: 04/20/2018 20:45   Ct Angio Chest Pe W And/or Wo Contrast  Result  Date: 04/20/2018 CLINICAL DATA:  Mid upper chest pain radiating to the right and into the back accompanied by nausea and recent diarrhea. EXAM: CT ANGIOGRAPHY CHEST CT ABDOMEN AND PELVIS WITH CONTRAST TECHNIQUE: Multidetector CT imaging of the chest was performed using the standard protocol during bolus administration of intravenous contrast. Multiplanar CT image reconstructions and MIPs were obtained to evaluate the vascular anatomy. Multidetector CT imaging of the abdomen and pelvis was performed using the standard protocol during bolus administration of intravenous contrast. CONTRAST:  161mL OMNIPAQUE IOHEXOL 350 MG/ML SOLN COMPARISON:  Same day CXR FINDINGS: CTA CHEST FINDINGS Cardiovascular: Cardiomegaly without pericardial effusion. Atherosclerosis of the thoracic aorta without aneurysm or dissection. Satisfactory opacification of the pulmonary arteries to the segmental level without acute pulmonary embolus. Left main and three-vessel coronary arteriosclerosis is noted. Atherosclerosis of the great vessels. Mediastinum/Nodes: No enlarged mediastinal, hilar, or axillary lymph nodes. Thyroid gland, trachea, and esophagus demonstrate no significant findings. Lungs/Pleura: Dependent atelectasis at the lung bases. No acute pulmonary consolidation, effusion or pneumothorax. Apical pleuroparenchymal scarring bilaterally. Musculoskeletal: T8 kyphoplasty. Osteopenic appearance of the thoracic spine with mild levoconvex curvature of the lower thoracic spine. Review of the MIP images confirms the above findings. CT ABDOMEN and PELVIS FINDINGS Hepatobiliary: No focal liver abnormality is seen. Status post cholecystectomy. No biliary dilatation. Pancreas: Atrophic without ductal dilatation, mass or inflammation. Spleen: Normal in size without focal abnormality. Adrenals/Urinary Tract: Normal bilateral adrenal glands. Small cysts of both kidneys without obstructive uropathy or suspicious enhancing lesions. The urinary  bladder is unremarkable for the degree of distention. Stomach/Bowel: Descending and sigmoid colonic diverticulosis without acute diverticulitis. No bowel obstruction or inflammation. Physiologic distention of the stomach with normal small bowel rotation is noted. Normal appearing appendix is visualized. Vascular/Lymphatic: Aortoiliac atherosclerosis. No aneurysm or adenopathy. Reproductive: Hysterectomy.  No adnexal mass. Other: No free air nor free fluid.  No abdominal wall hernia. Musculoskeletal: Dextroconvex curvature of the lumbar spine. Acute nor aggressive osseous lesions. Osteoarthritic joint space narrowing of both hips. Review of the MIP images confirms the above findings. IMPRESSION: Chest CT: 1. Aortic atherosclerosis without aneurysm or dissection. 2. No acute pulmonary embolus. 3. Cardiomegaly with coronary arteriosclerosis. 4. T8 kyphoplasty. Osteopenic appearance of the thoracic spine without acute osseous abnormality. Levoscoliosis of the lower thoracic spine. CT AP: 1. Descending and sigmoid diverticulosis without acute diverticulitis. 2. Aortoiliac atherosclerosis without aneurysm. 3. Small cysts of both kidneys.  No obstructive uropathy.  Electronically Signed   By: Ashley Royalty M.D.   On: 04/20/2018 23:11   Ct Abdomen Pelvis W Contrast  Result Date: 04/20/2018 CLINICAL DATA:  Mid upper chest pain radiating to the right and into the back accompanied by nausea and recent diarrhea. EXAM: CT ANGIOGRAPHY CHEST CT ABDOMEN AND PELVIS WITH CONTRAST TECHNIQUE: Multidetector CT imaging of the chest was performed using the standard protocol during bolus administration of intravenous contrast. Multiplanar CT image reconstructions and MIPs were obtained to evaluate the vascular anatomy. Multidetector CT imaging of the abdomen and pelvis was performed using the standard protocol during bolus administration of intravenous contrast. CONTRAST:  144mL OMNIPAQUE IOHEXOL 350 MG/ML SOLN COMPARISON:  Same day CXR  FINDINGS: CTA CHEST FINDINGS Cardiovascular: Cardiomegaly without pericardial effusion. Atherosclerosis of the thoracic aorta without aneurysm or dissection. Satisfactory opacification of the pulmonary arteries to the segmental level without acute pulmonary embolus. Left main and three-vessel coronary arteriosclerosis is noted. Atherosclerosis of the great vessels. Mediastinum/Nodes: No enlarged mediastinal, hilar, or axillary lymph nodes. Thyroid gland, trachea, and esophagus demonstrate no significant findings. Lungs/Pleura: Dependent atelectasis at the lung bases. No acute pulmonary consolidation, effusion or pneumothorax. Apical pleuroparenchymal scarring bilaterally. Musculoskeletal: T8 kyphoplasty. Osteopenic appearance of the thoracic spine with mild levoconvex curvature of the lower thoracic spine. Review of the MIP images confirms the above findings. CT ABDOMEN and PELVIS FINDINGS Hepatobiliary: No focal liver abnormality is seen. Status post cholecystectomy. No biliary dilatation. Pancreas: Atrophic without ductal dilatation, mass or inflammation. Spleen: Normal in size without focal abnormality. Adrenals/Urinary Tract: Normal bilateral adrenal glands. Small cysts of both kidneys without obstructive uropathy or suspicious enhancing lesions. The urinary bladder is unremarkable for the degree of distention. Stomach/Bowel: Descending and sigmoid colonic diverticulosis without acute diverticulitis. No bowel obstruction or inflammation. Physiologic distention of the stomach with normal small bowel rotation is noted. Normal appearing appendix is visualized. Vascular/Lymphatic: Aortoiliac atherosclerosis. No aneurysm or adenopathy. Reproductive: Hysterectomy.  No adnexal mass. Other: No free air nor free fluid.  No abdominal wall hernia. Musculoskeletal: Dextroconvex curvature of the lumbar spine. Acute nor aggressive osseous lesions. Osteoarthritic joint space narrowing of both hips. Review of the MIP images  confirms the above findings. IMPRESSION: Chest CT: 1. Aortic atherosclerosis without aneurysm or dissection. 2. No acute pulmonary embolus. 3. Cardiomegaly with coronary arteriosclerosis. 4. T8 kyphoplasty. Osteopenic appearance of the thoracic spine without acute osseous abnormality. Levoscoliosis of the lower thoracic spine. CT AP: 1. Descending and sigmoid diverticulosis without acute diverticulitis. 2. Aortoiliac atherosclerosis without aneurysm. 3. Small cysts of both kidneys.  No obstructive uropathy. Electronically Signed   By: Ashley Royalty M.D.   On: 04/20/2018 23:11

## 2018-04-20 NOTE — ED Notes (Signed)
Patient transported to CT 

## 2018-04-20 NOTE — ED Triage Notes (Signed)
Pt to triage via w/c with no distress noted; reports mid upper CP radiating over to right side and into back accomp by nausea and recent diarrhea

## 2018-04-21 LAB — TROPONIN I

## 2018-06-16 ENCOUNTER — Other Ambulatory Visit: Payer: Self-pay | Admitting: Internal Medicine

## 2018-06-16 DIAGNOSIS — Z1231 Encounter for screening mammogram for malignant neoplasm of breast: Secondary | ICD-10-CM

## 2018-07-12 DIAGNOSIS — I25118 Atherosclerotic heart disease of native coronary artery with other forms of angina pectoris: Secondary | ICD-10-CM | POA: Insufficient documentation

## 2018-07-13 ENCOUNTER — Ambulatory Visit: Payer: Self-pay | Admitting: Urology

## 2018-07-21 ENCOUNTER — Ambulatory Visit
Admission: RE | Admit: 2018-07-21 | Discharge: 2018-07-21 | Disposition: A | Discharge status: Home / Self Care | Payer: Medicare Other | Source: Ambulatory Visit | Attending: Internal Medicine | Admitting: Internal Medicine

## 2018-07-21 DIAGNOSIS — Z1231 Encounter for screening mammogram for malignant neoplasm of breast: Secondary | ICD-10-CM | POA: Insufficient documentation

## 2018-07-21 IMAGING — MG DIGITAL SCREENING BILATERAL MAMMOGRAM WITH TOMO AND CAD
8 series · 8 of 24 positions shown · non-contrast
Comparison: Previous exam(s).

CLINICAL DATA: Screening.

EXAM:
DIGITAL SCREENING BILATERAL MAMMOGRAM WITH TOMO AND CAD

[L MLO synth-2D]
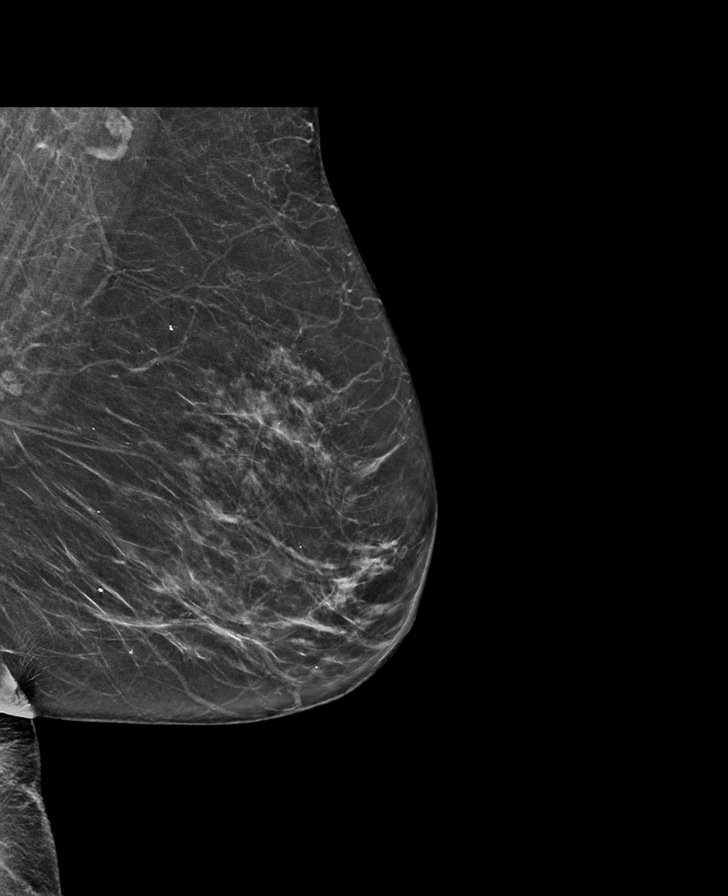

[R MLO synth-2D]
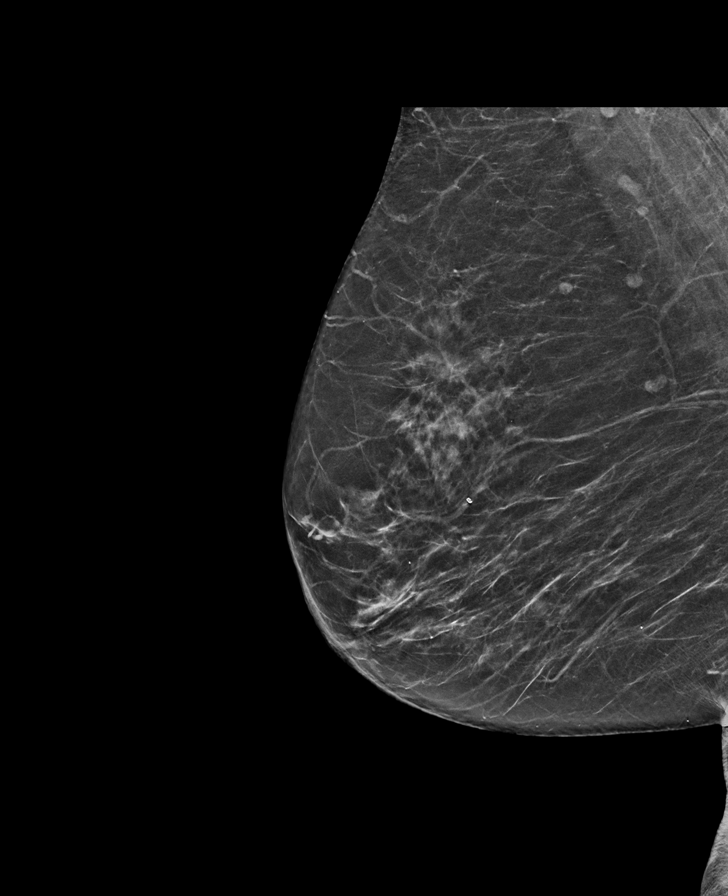

[L CC synth-2D]
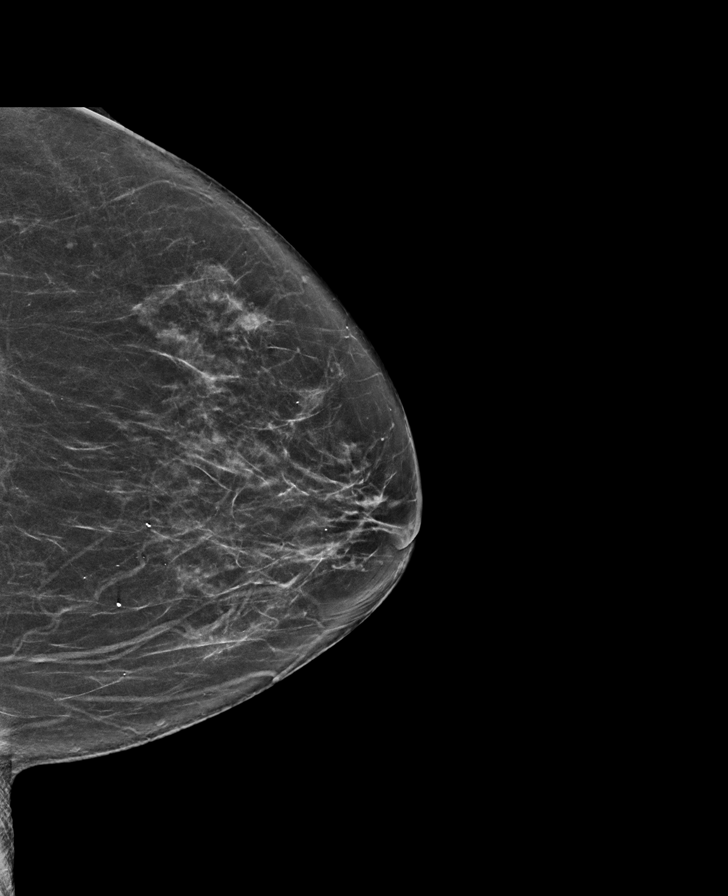

[R CC synth-2D]
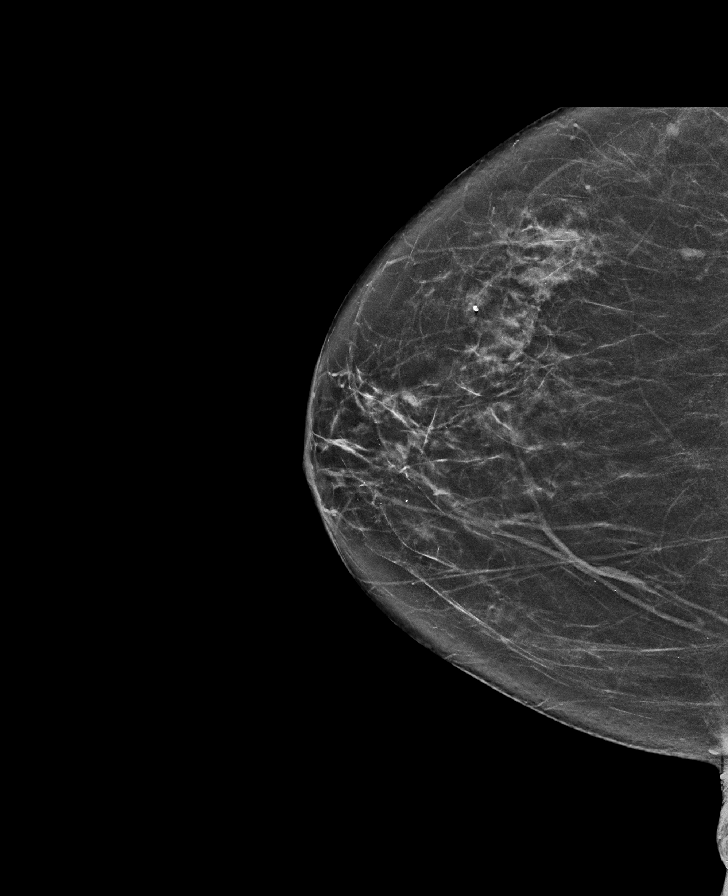

[L CC tomo · tomo slice 33/64.0]
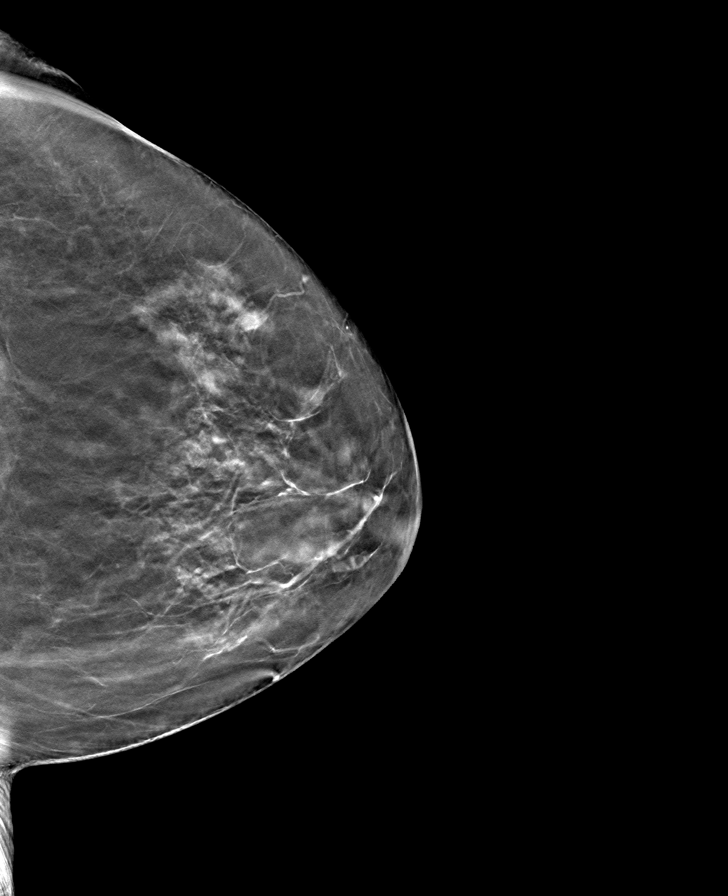

[R MLO tomo · tomo slice 33/64.0]
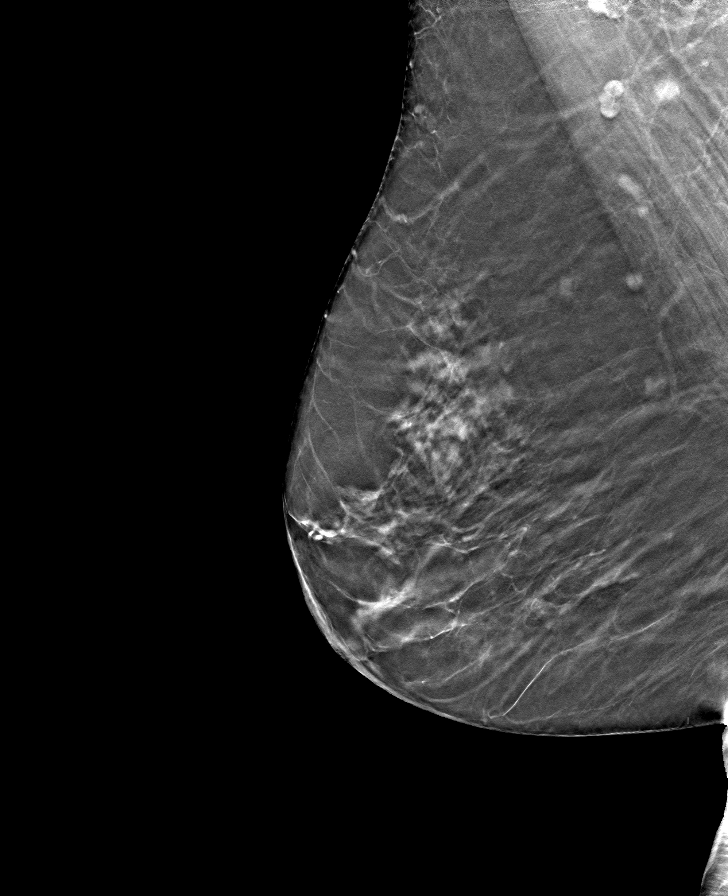

[R CC tomo · tomo slice 32/63.0]
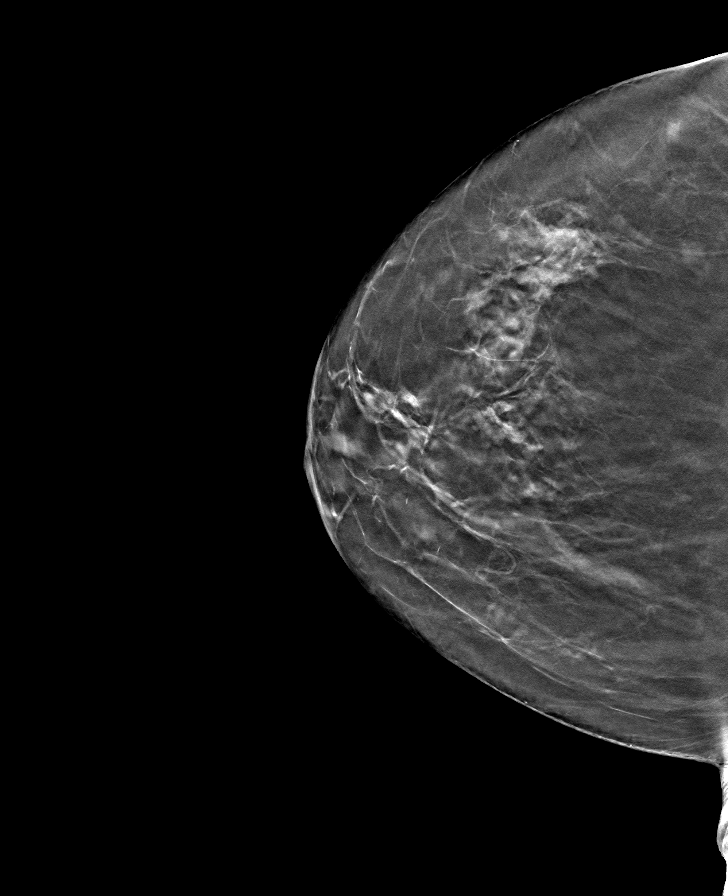

[L MLO tomo · tomo slice 31/62.0]
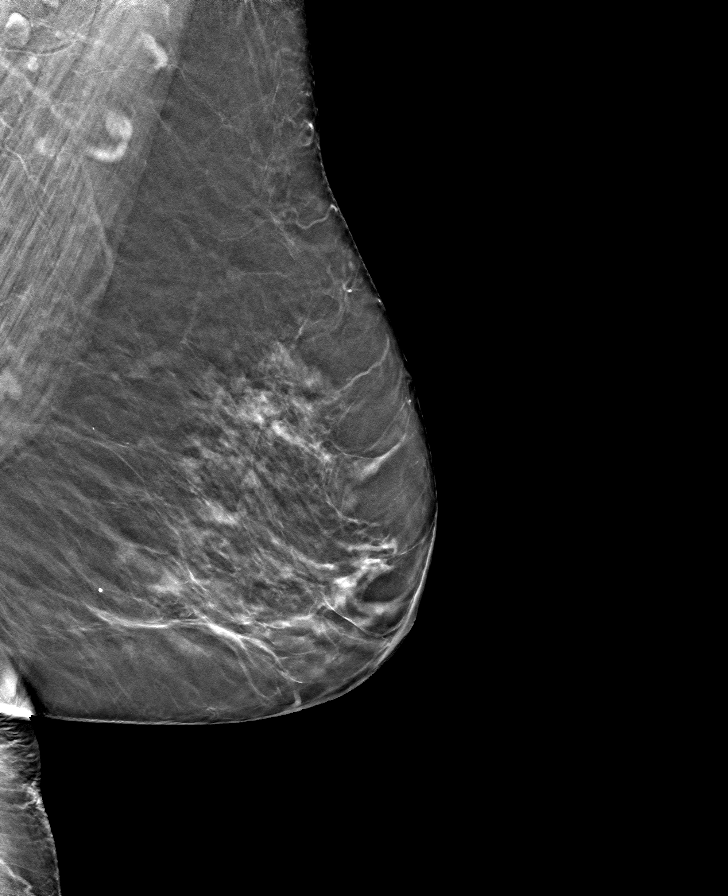

[8 of 24 positions shown; findings below may reference images not displayed]

ACR Breast Density Category b: There are scattered areas of
fibroglandular density.
FINDINGS: There are no findings suspicious for malignancy. Images were
processed with CAD.
IMPRESSION: No mammographic evidence of malignancy. A result letter of this
screening mammogram will be mailed directly to the patient.

RECOMMENDATION:
Screening mammogram in one year. (Code:[TQ])

BI-RADS CATEGORY  1: Negative.

## 2018-08-11 ENCOUNTER — Ambulatory Visit: Payer: Self-pay | Admitting: Urology

## 2018-10-01 ENCOUNTER — Ambulatory Visit
Admission: RE | Admit: 2018-10-01 | Discharge: 2018-10-01 | Disposition: A | Discharge status: Home / Self Care | Payer: Medicare Other | Source: Ambulatory Visit | Attending: Internal Medicine | Admitting: Internal Medicine

## 2018-10-01 ENCOUNTER — Other Ambulatory Visit: Payer: Self-pay

## 2018-10-01 ENCOUNTER — Other Ambulatory Visit: Payer: Self-pay | Admitting: Internal Medicine

## 2018-10-01 DIAGNOSIS — M79662 Pain in left lower leg: Secondary | ICD-10-CM | POA: Diagnosis not present

## 2018-10-14 ENCOUNTER — Ambulatory Visit: Payer: Self-pay | Admitting: Urology

## 2018-11-30 ENCOUNTER — Ambulatory Visit: Payer: Medicare Other | Admitting: Urology

## 2019-01-11 ENCOUNTER — Encounter: Payer: Self-pay | Admitting: Urology

## 2019-01-11 ENCOUNTER — Ambulatory Visit (INDEPENDENT_AMBULATORY_CARE_PROVIDER_SITE_OTHER): Payer: Medicare Other | Admitting: Urology

## 2019-01-11 ENCOUNTER — Other Ambulatory Visit: Payer: Self-pay

## 2019-01-11 VITALS — BP 152/71 | HR 67 | Ht 66.0 in | Wt 172.4 lb

## 2019-01-11 DIAGNOSIS — N281 Cyst of kidney, acquired: Secondary | ICD-10-CM

## 2019-01-11 DIAGNOSIS — Z87440 Personal history of urinary (tract) infections: Secondary | ICD-10-CM

## 2019-01-11 DIAGNOSIS — R3914 Feeling of incomplete bladder emptying: Secondary | ICD-10-CM

## 2019-01-11 DIAGNOSIS — R351 Nocturia: Secondary | ICD-10-CM

## 2019-01-11 DIAGNOSIS — R339 Retention of urine, unspecified: Secondary | ICD-10-CM

## 2019-01-11 LAB — MICROSCOPIC EXAMINATION
Bacteria, UA: NONE SEEN
RBC, Urine: NONE SEEN /hpf (ref 0–2)

## 2019-01-11 LAB — URINALYSIS, COMPLETE
Bilirubin, UA: NEGATIVE
Glucose, UA: NEGATIVE
Ketones, UA: NEGATIVE
Nitrite, UA: NEGATIVE
Protein,UA: NEGATIVE
RBC, UA: NEGATIVE
Specific Gravity, UA: 1.025 (ref 1.005–1.030)
Urobilinogen, Ur: 0.2 mg/dL (ref 0.2–1.0)
pH, UA: 5.5 (ref 5.0–7.5)

## 2019-01-11 LAB — BLADDER SCAN AMB NON-IMAGING

## 2019-01-11 NOTE — Progress Notes (Signed)
01/11/2019 8:40 AM   Laura Cole [birth year] [id number]  Referring provider: Baxter Hire, MD [place],  [place] [address]  Chief Complaint  Patient presents with  . Establish Care  . Nocturia    HPI: 78 year old female referred for further evaluation of recurrent urinary tract infections.  Referral was placed by her new primary care physician back in January 2020 but is been delayed due to patient illness and COVID outbreak.  She reports that she is a remote history of frequent recurrent urinary tract infections.  She previously lived and received care in [facility].  Since January, she has no documented urinary tract infections.  She has been doing well.  She does report that over the past 2 days, she had a twinge in her urethra but not severe.  She does report that she is been told that she does not empty her bladder completely.  Occasionally, she has to strain to void, will press on her lower abdomen and lean forward to completely empty.  She also believes that her bladder stop but is not interested in surgical intervention for this.  PVR today is minimal.       PMH: Past Medical History:  Diagnosis Date  . Bradycardia   . Cancer (Gross)    skin cancer  . Diverticulitis   . Hypertension   . Osteoarthritis     Surgical History: Past Surgical History:  Procedure Laterality Date  . ABDOMINAL HYSTERECTOMY    . BACK SURGERY    . CHOLECYSTECTOMY    . RECTOCELE REPAIR    . REPLACEMENT TOTAL KNEE Bilateral   . WRIST SURGERY      Home Medications:  Allergies as of 01/11/2019      Reactions   Mushroom Extract Complex Nausea Only, Nausea And Vomiting   Other reaction(s): Vomiting   Promethazine    Other reaction(s): Unknown   Alprazolam    Other reaction(s): Other (See Comments)   Cefdinir Nausea Only   Ciprofloxacin    Other reaction(s): Other (See Comments)   Dilaudid [hydromorphone Hcl]    Phenergan [promethazine Hcl]     Pravastatin    Other reaction(s): Muscle Pain   Propofol       Medication List       Accurate as of January 11, 2019 11:59 PM. If you have any questions, ask your nurse or doctor.        amLODipine 5 MG tablet Commonly known as: NORVASC   aspirin 81 MG chewable tablet Chew by mouth.   clobetasol 0.05 % external solution Commonly known as: TEMOVATE Mix 18mL into 1# jar of CeraVe and apply daily as directed   metoprolol tartrate 25 MG tablet Commonly known as: LOPRESSOR Take by mouth.   Vitamin D-1000 Max St 25 MCG (1000 UT) tablet Generic drug: Cholecalciferol Take by mouth.       Allergies:  Allergies  Allergen Reactions  . Mushroom Extract Complex Nausea Only and Nausea And Vomiting    Other reaction(s): Vomiting  . Promethazine     Other reaction(s): Unknown  . Alprazolam     Other reaction(s): Other (See Comments)  . Cefdinir Nausea Only  . Ciprofloxacin     Other reaction(s): Other (See Comments)  . Dilaudid [Hydromorphone Hcl]   . Phenergan [Promethazine Hcl]   . Pravastatin     Other reaction(s): Muscle Pain  . Propofol     Family History: Family History  Problem Relation Age of Onset  .  Breast cancer Sister     Social History:  reports that she has never smoked. She has never used smokeless tobacco. She reports previous alcohol use. She reports that she does not use drugs.  ROS: UROLOGY Frequent Urination?: Yes Hard to postpone urination?: Yes Burning/pain with urination?: No Get up at night to urinate?: Yes Leakage of urine?: Yes Urine stream starts and stops?: No Trouble starting stream?: No Do you have to strain to urinate?: Yes Blood in urine?: No Urinary tract infection?: Yes Sexually transmitted disease?: No Injury to kidneys or bladder?: No Painful intercourse?: No Weak stream?: No Currently pregnant?: No Vaginal bleeding?: No Last menstrual period?: Postmenopausal  Gastrointestinal Nausea?: No Vomiting?:  No Indigestion/heartburn?: Yes Diarrhea?: No Constipation?: Yes  Constitutional Fever: No Night sweats?: No Weight loss?: No Fatigue?: Yes  Skin Skin rash/lesions?: No Itching?: Yes  Eyes Blurred vision?: Yes Double vision?: No  Ears/Nose/Throat Sore throat?: No Sinus problems?: Yes  Hematologic/Lymphatic Swollen glands?: No Easy bruising?: Yes  Cardiovascular Leg swelling?: Yes Chest pain?: No  Respiratory Cough?: Yes Shortness of breath?: No  Endocrine Excessive thirst?: No  Musculoskeletal Back pain?: Yes Joint pain?: No  Neurological Headaches?: No Dizziness?: No  Psychologic Depression?: No Anxiety?: No  Physical Exam: BP (!) 152/71 (BP Location: Left Arm, Patient Position: Sitting, Cuff Size: Normal)   Pulse 67   Ht 5\' 6"  (1.676 m)   Wt 172 lb 6.4 oz (78.2 kg)   BMI 27.83 kg/m   Constitutional:  Alert and oriented, No acute distress. HEENT: [place] AT, moist mucus membranes.  Trachea midline, no masses. Cardiovascular: No clubbing, cyanosis, or edema. Respiratory: Normal respiratory effort, no increased work of breathing. Skin: No rashes, bruises or suspicious lesions. Neurologic: Grossly intact, no focal deficits, moving all 4 extremities. Psychiatric: Normal mood and affect.  Laboratory Data: Lab Results  Component Value Date   WBC 6.2 04/20/2018   HGB 13.8 04/20/2018   HCT 41.9 04/20/2018   MCV 90.7 04/20/2018   PLT 284 04/20/2018    Lab Results  Component Value Date   CREATININE 0.86 04/20/2018     Pertinent Imaging: Results for orders placed or performed in visit on 01/11/19  CULTURE, URINE COMPREHENSIVE   Specimen: Urine   UR  Result Value Ref Range   Urine Culture, Comprehensive Final report (A)    Organism ID, Bacteria Escherichia coli (A)    ANTIMICROBIAL SUSCEPTIBILITY Comment   Microscopic Examination   URINE  Result Value Ref Range   WBC, UA 6-10 (A) 0 - 5 /hpf   RBC None seen 0 - 2 /hpf   Epithelial Cells  (non renal) 0-10 0 - 10 /hpf   Bacteria, UA None seen None seen/Few  Urinalysis, Complete  Result Value Ref Range   Specific Gravity, UA 1.025 1.005 - 1.030   pH, UA 5.5 5.0 - 7.5   Color, UA Yellow Yellow   Appearance Ur Hazy (A) Clear   Leukocytes,UA Trace (A) Negative   Protein,UA Negative Negative/Trace   Glucose, UA Negative Negative   Ketones, UA Negative Negative   RBC, UA Negative Negative   Bilirubin, UA Negative Negative   Urobilinogen, Ur 0.2 0.2 - 1.0 mg/dL   Nitrite, UA Negative Negative   Microscopic Examination See below:   Bladder Scan (Post Void Residual) in office  Result Value Ref Range   Scan Result 5mL     Assessment & Plan:    1. Nocturia Behavioral modification discussed as primary intervention  Return if fails to improve -  Urinalysis, Complete - Bladder Scan (Post Void Residual) in office - CULTURE, URINE COMPREHENSIVE   2. History of recurrent UTI (urinary tract infection) No recent infections, will hold off on further intervention for the time being, return if she develops recurrent issues  3. Feeling of incomplete bladder emptying Adequate emptying today, patient reassured   Hollice Espy, MD  [facility] [address], [place], [place] [address] [phone]

## 2019-01-13 LAB — CULTURE, URINE COMPREHENSIVE

## 2019-01-14 ENCOUNTER — Telehealth: Payer: Self-pay | Admitting: *Deleted

## 2019-01-14 MED ORDER — NITROFURANTOIN MONOHYD MACRO 100 MG PO CAPS: 100.0000 mg | ORAL_CAPSULE | Freq: Two times a day (BID) | ORAL | 0 refills | Status: AC

## 2019-01-14 NOTE — Telephone Encounter (Signed)
Notified patient as instructed, patient pleased. Discussed follow-up appointments, patient agrees Rx sent .

## 2019-01-19 ENCOUNTER — Telehealth: Payer: Self-pay | Admitting: Urology

## 2019-01-19 NOTE — Telephone Encounter (Signed)
Agree.  Not the med.    See pcp for bleeding.  Sounds like possible hemorrhoid but pcp needs to know.  May need colonoscopy.  Finish abx.    Hollice Espy

## 2019-01-19 NOTE — Telephone Encounter (Signed)
Pt called and states that she has been on macrobid and is now bleeding from her rectum. Please advise.

## 2019-01-19 NOTE — Telephone Encounter (Signed)
Pt calls and states that she is very concerned that the macrobid she's taking is causing rectal bleeding. Pt began Macrobid on 01/11/2019. She denies constipation or hard stools, denies any fever or chills. She states that she has 4 more days of the medication left and does not want to complete the course. Bleeding was bright and only seen on the paper. One episode. I advised pt that it is unlikely that the bleeding is related to to medication as she has been taking it for a week. She states she would like the opinion of the physician. Please advise. Thanks

## 2019-01-20 NOTE — Telephone Encounter (Signed)
Called pt informed her of the information below per Dr. Erlene Quan, pt gave verbal understanding.

## 2019-06-14 ENCOUNTER — Other Ambulatory Visit: Payer: Self-pay | Admitting: Internal Medicine

## 2019-06-14 ENCOUNTER — Other Ambulatory Visit: Payer: Self-pay

## 2019-06-14 ENCOUNTER — Ambulatory Visit
Admission: RE | Admit: 2019-06-14 | Discharge: 2019-06-14 | Disposition: A | Discharge status: Home / Self Care | Payer: Medicare Other | Source: Ambulatory Visit | Attending: Internal Medicine | Admitting: Internal Medicine

## 2019-06-14 DIAGNOSIS — R600 Localized edema: Secondary | ICD-10-CM

## 2019-06-14 IMAGING — US US EXTREM LOW VENOUS*L*
1 series · 14 of 24 positions shown · non-contrast
Comparison: [DATE]

CLINICAL DATA: Edema, pain x5 days

EXAM:
LEFT LOWER EXTREMITY VENOUS DOPPLER ULTRASOUND
TECHNIQUE: Gray-scale sonography with compression, as well as color and duplex
ultrasound, were performed to evaluate the deep venous system(s)
from the level of the common femoral vein through the popliteal and
proximal calf veins.

[Series 1: us extrem low venous*left* · 0.07mm/px · 14 of 34 slices shown]
[im 1/34]
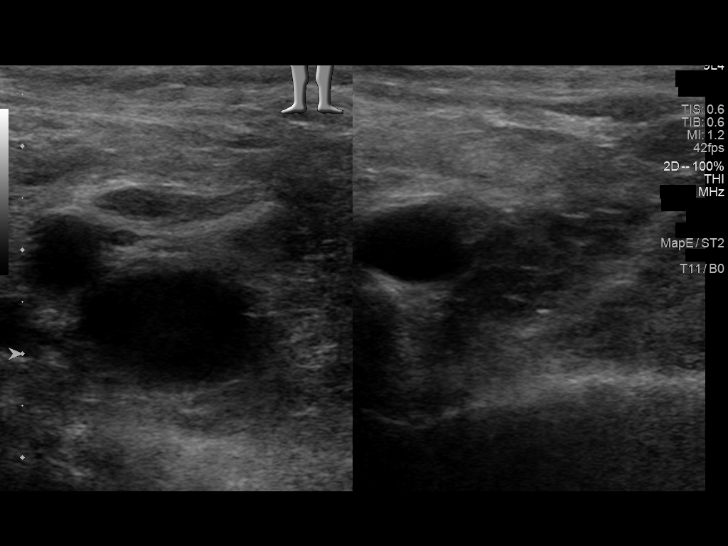
[im 3/34]
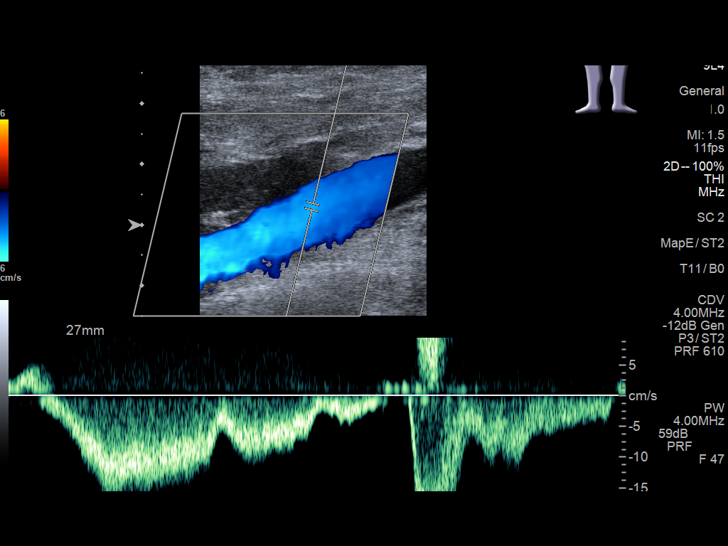
[im 6/34]
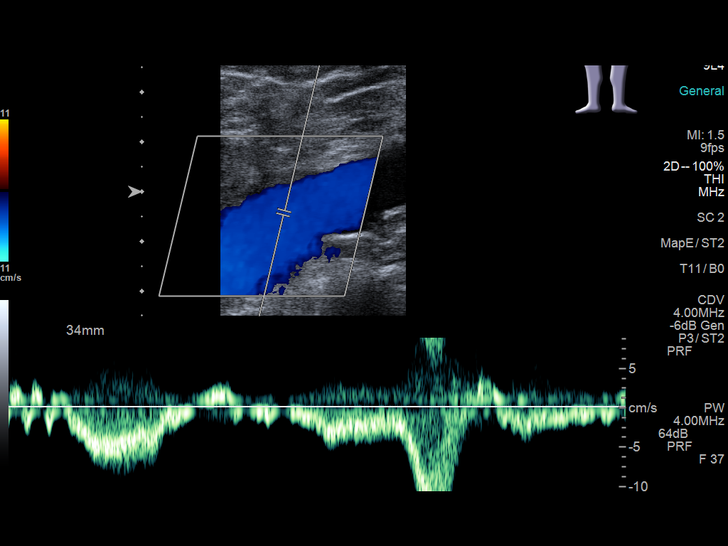
[im 9/34]
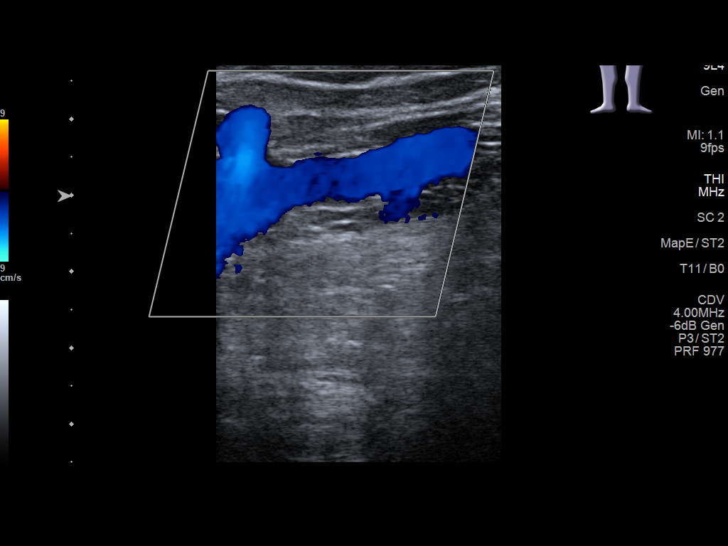
[im 11/34]
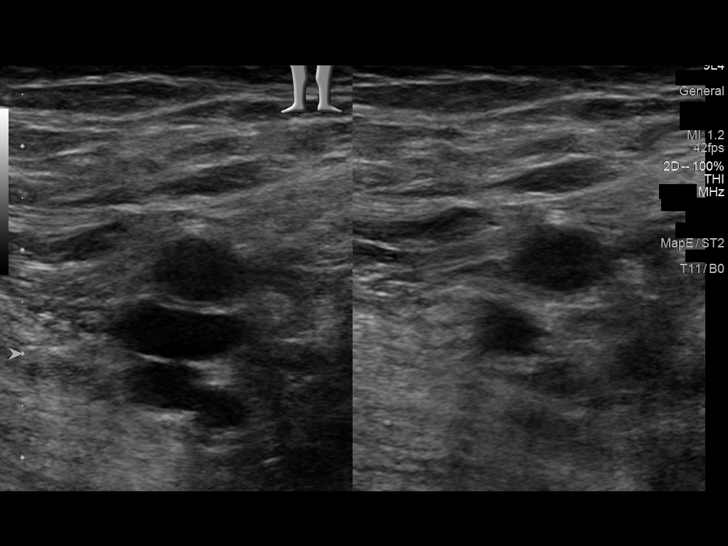
[im 13/34]
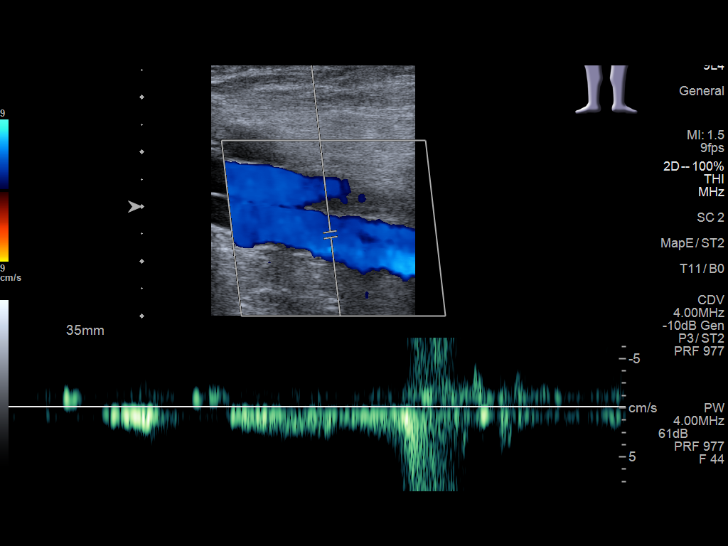
[im 16/34]
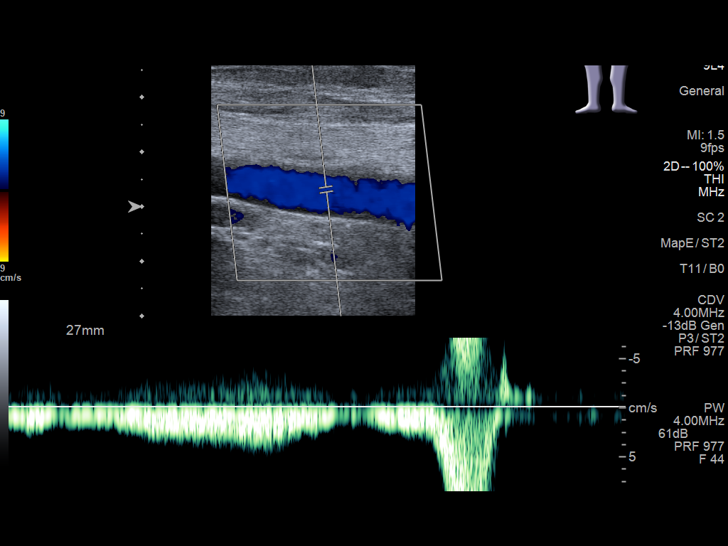
[im 18/34]
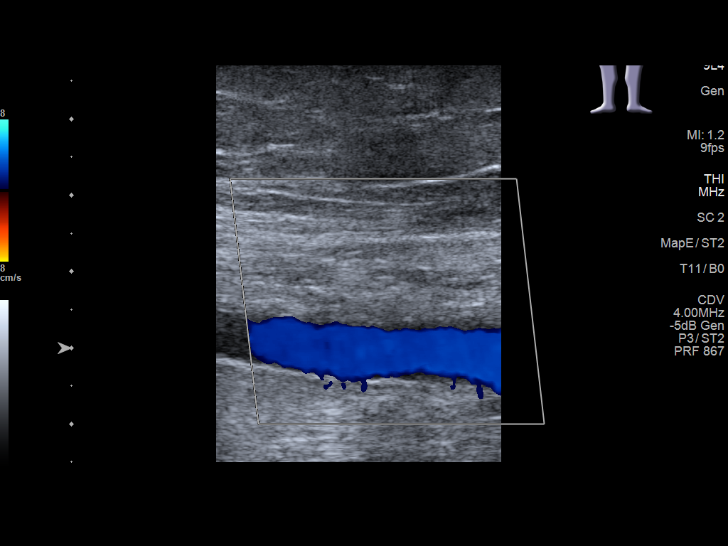
[im 21/34]
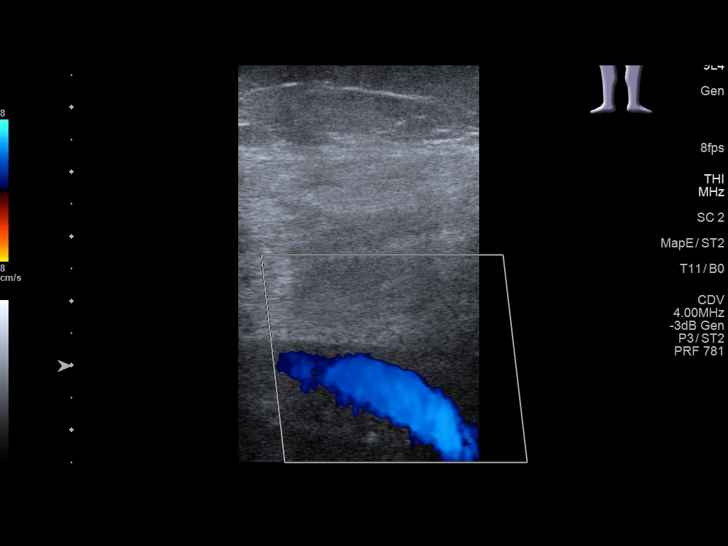
[im 23/34]
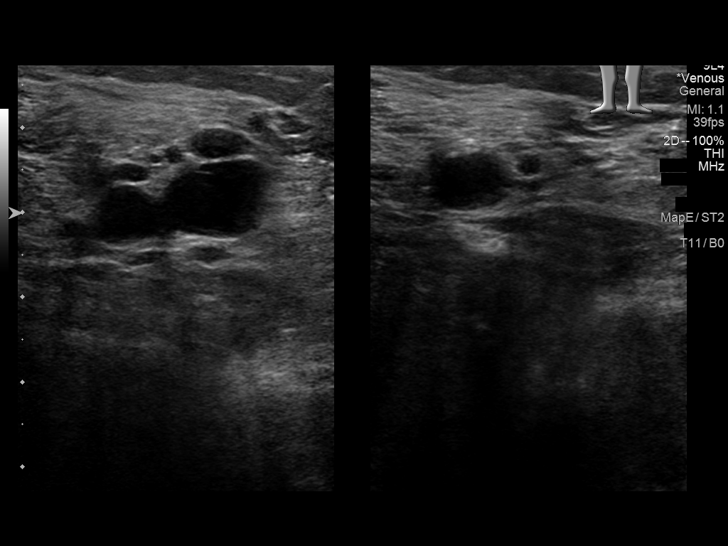
[im 26/34]
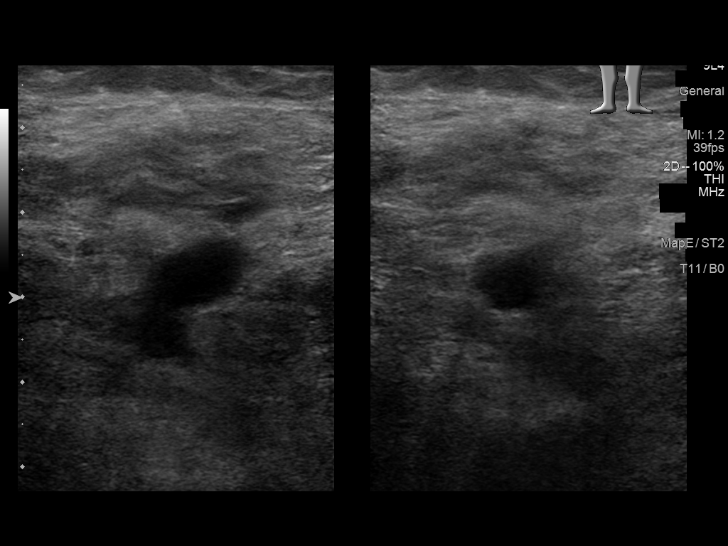
[im 28/34]
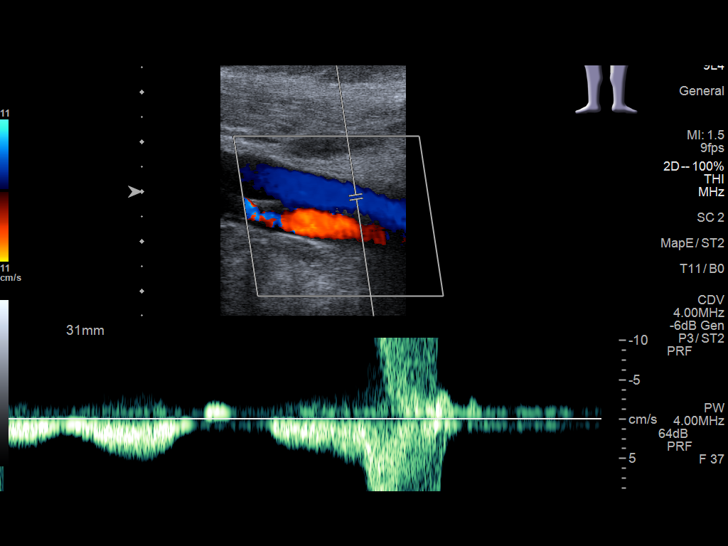
[im 31/34]
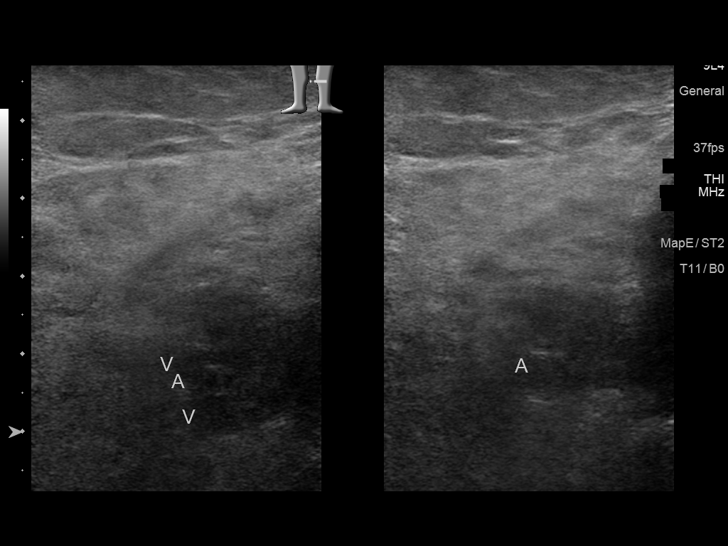
[im 34/34]
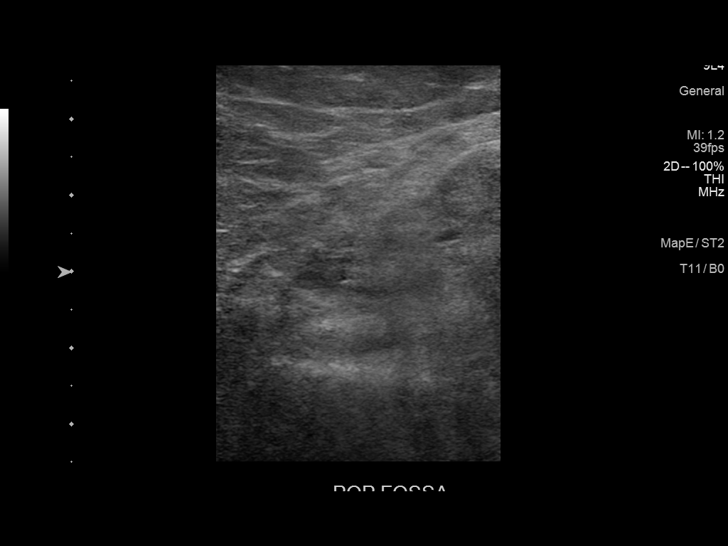

[14 of 24 positions shown; findings below may reference images not displayed]

FINDINGS: VENOUS

Normal compressibility of the common femoral, superficial femoral,
and popliteal veins, as well as the visualized calf veins.
Visualized portions of profunda femoral vein and great saphenous
vein unremarkable. No filling defects to suggest DVT on grayscale or
color Doppler imaging. Doppler waveforms show normal direction of
venous flow, normal respiratory phasicity and response to
augmentation.

Limited views of the contralateral common femoral vein are
unremarkable.

OTHER

None.

Limitations: none
IMPRESSION: No femoropopliteal DVT nor evidence of DVT within the visualized
calf veins.
If clinical symptoms are inconsistent or if there are persistent or
worsening symptoms, further imaging (possibly involving the iliac
veins) may be warranted.

## 2019-07-27 ENCOUNTER — Other Ambulatory Visit: Payer: Self-pay | Admitting: Internal Medicine

## 2019-07-27 DIAGNOSIS — Z1231 Encounter for screening mammogram for malignant neoplasm of breast: Secondary | ICD-10-CM

## 2019-08-29 ENCOUNTER — Ambulatory Visit
Admission: RE | Admit: 2019-08-29 | Discharge: 2019-08-29 | Disposition: A | Discharge status: Home / Self Care | Payer: Medicare Other | Source: Ambulatory Visit | Attending: Internal Medicine | Admitting: Internal Medicine

## 2019-08-29 DIAGNOSIS — Z1231 Encounter for screening mammogram for malignant neoplasm of breast: Secondary | ICD-10-CM

## 2019-08-29 IMAGING — MG DIGITAL SCREENING BILAT W/ TOMO W/ CAD
8 series · 8 of 24 positions shown · non-contrast
Comparison: Previous exam(s).

CLINICAL DATA: Screening.

EXAM:
DIGITAL SCREENING BILATERAL MAMMOGRAM WITH TOMO AND CAD

[L MLO synth-2D]
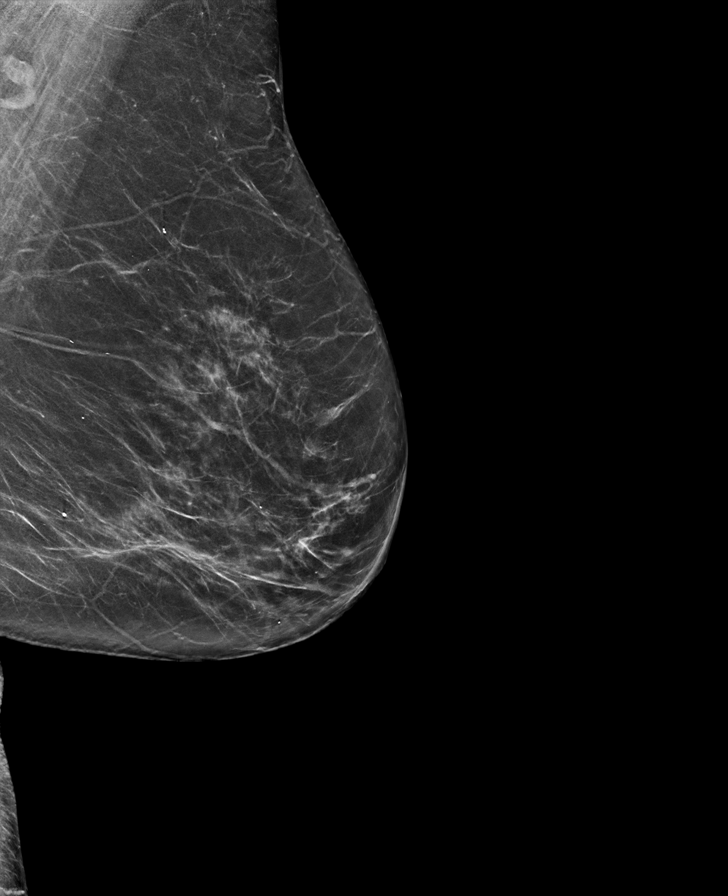

[R MLO synth-2D]
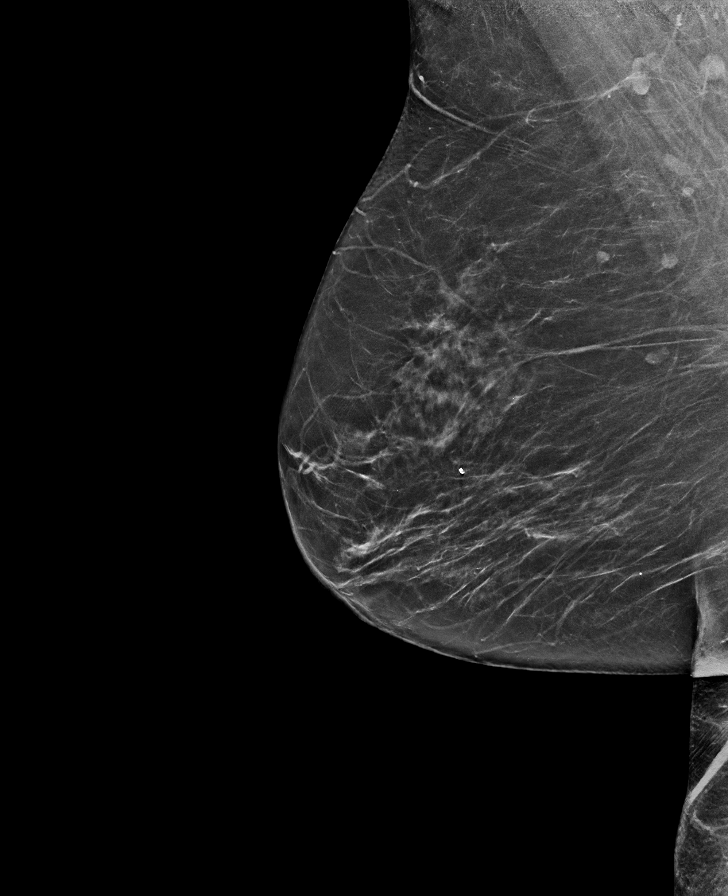

[R CC synth-2D]
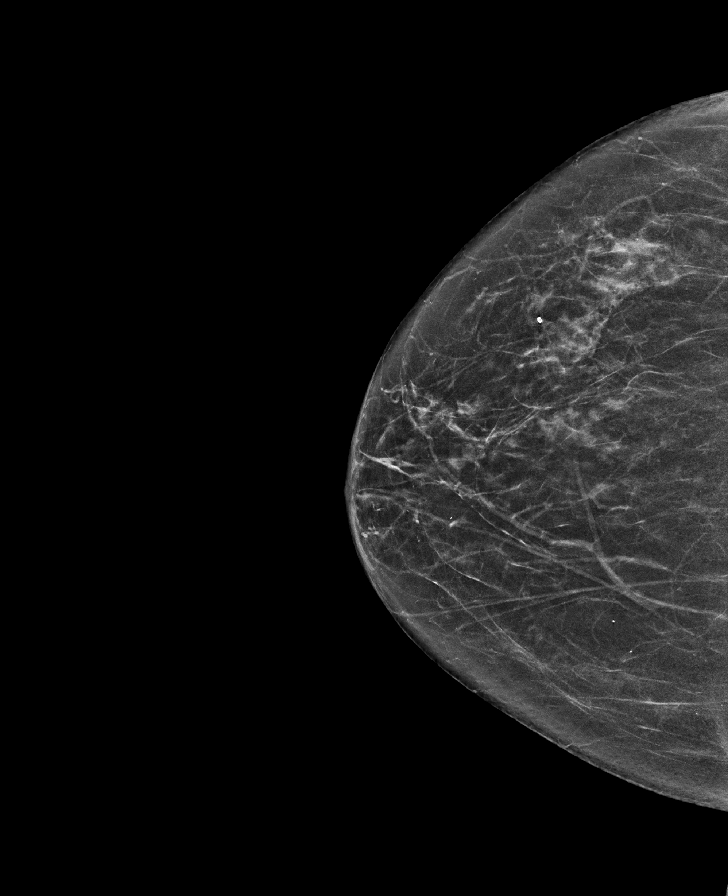

[L CC synth-2D]
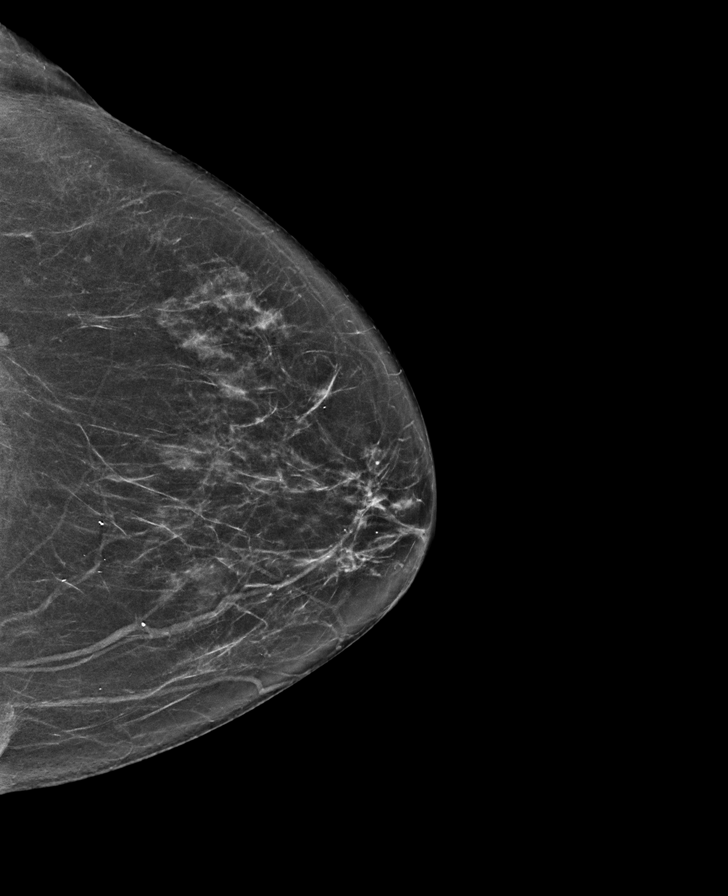

[L MLO tomo · tomo slice 35/69.0]
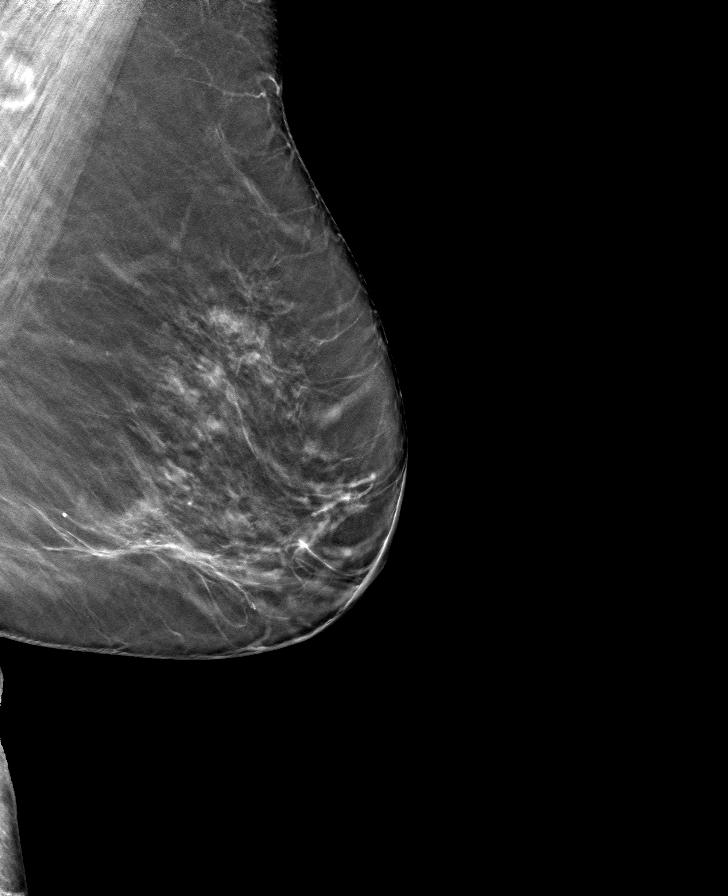

[L CC tomo · tomo slice 32/63.0]
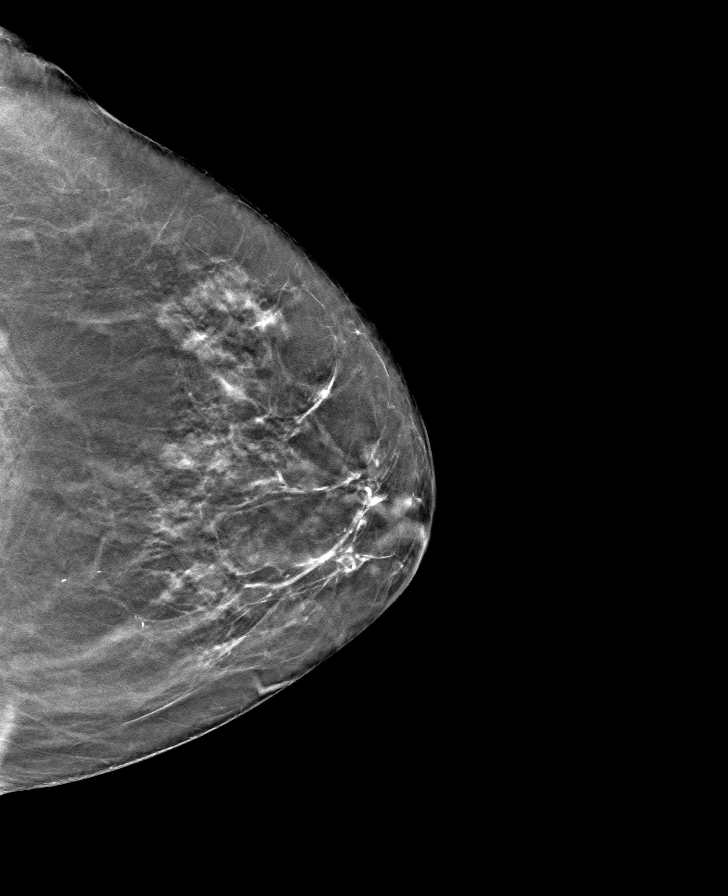

[R MLO tomo · tomo slice 35/69.0]
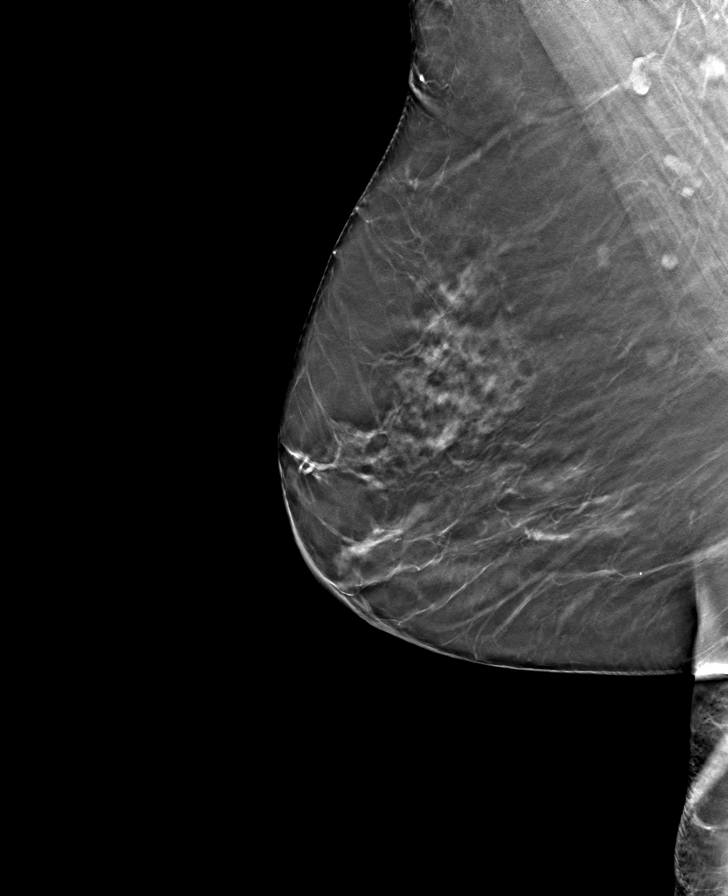

[R CC tomo · tomo slice 31/60.0]
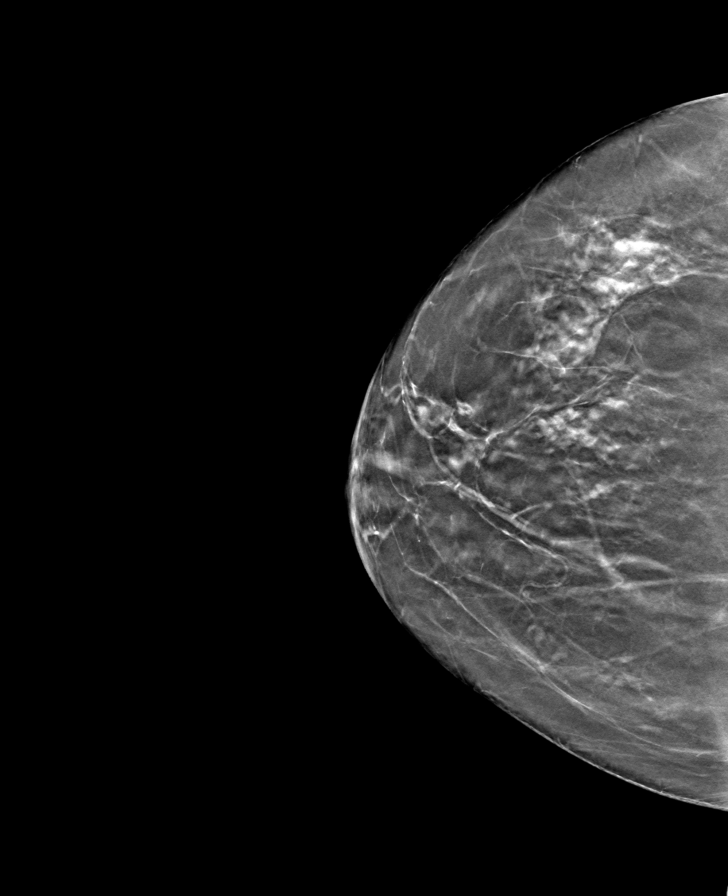

[8 of 24 positions shown; findings below may reference images not displayed]

ACR Breast Density Category b: There are scattered areas of
fibroglandular density.
FINDINGS: There are no findings suspicious for malignancy. Images were
processed with CAD.
IMPRESSION: No mammographic evidence of malignancy. A result letter of this
screening mammogram will be mailed directly to the patient.

RECOMMENDATION:
Screening mammogram in one year. (Code:[TQ])

BI-RADS CATEGORY  1: Negative.

## 2019-11-23 ENCOUNTER — Ambulatory Visit
Admission: RE | Admit: 2019-11-23 | Discharge: 2019-11-23 | Disposition: A | Discharge status: Home / Self Care | Payer: Medicare Other | Source: Ambulatory Visit | Attending: Family Medicine | Admitting: Family Medicine

## 2019-11-23 ENCOUNTER — Other Ambulatory Visit: Payer: Self-pay | Admitting: Family Medicine

## 2019-11-23 ENCOUNTER — Other Ambulatory Visit: Payer: Self-pay

## 2019-11-23 DIAGNOSIS — R140 Abdominal distension (gaseous): Secondary | ICD-10-CM

## 2019-11-23 DIAGNOSIS — R112 Nausea with vomiting, unspecified: Secondary | ICD-10-CM | POA: Diagnosis present

## 2019-11-23 DIAGNOSIS — K5732 Diverticulitis of large intestine without perforation or abscess without bleeding: Secondary | ICD-10-CM

## 2019-11-23 LAB — POCT I-STAT CREATININE: Creatinine, Ser: 0.8 mg/dL (ref 0.44–1.00)

## 2019-11-23 IMAGING — CT CT ABD-PELV W/ CM
2 of 5 series · 16 of 46 positions shown, 18 images · IV contrast (APPLIED)
Comparison: CT [DATE]

CLINICAL DATA: Right upper and left lower quadrant pain with
bloating and distension

EXAM:
CT ABDOMEN AND PELVIS WITH CONTRAST
TECHNIQUE: Multidetector CT imaging of the abdomen and pelvis was performed
using the standard protocol following bolus administration of
intravenous contrast.
CONTRAST:  100mL OMNIPAQUE IOHEXOL 300 MG/ML  SOLN

[Series 2: routine abd/pel with · axial · 0.82mm/px · z∈[-462,-38]mm · 13 of 96 slices shown, 15 images]
[im 6/96  soft-tissue]
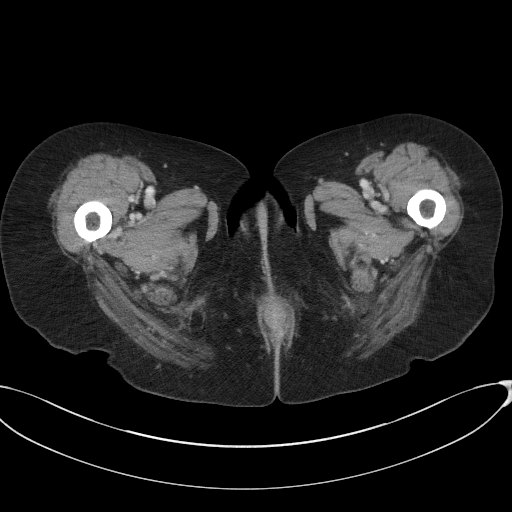
[im 6/96  bone]
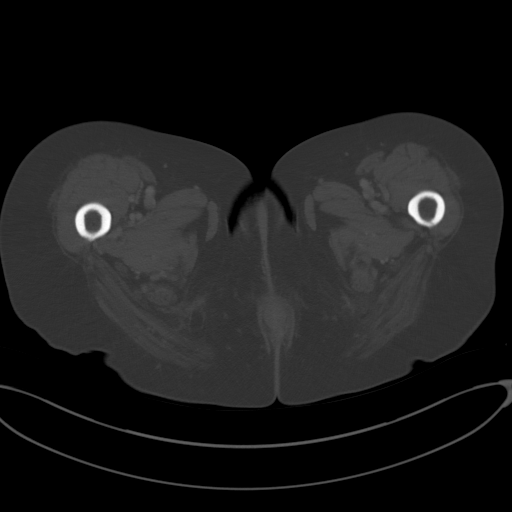
[im 16/96  soft-tissue]
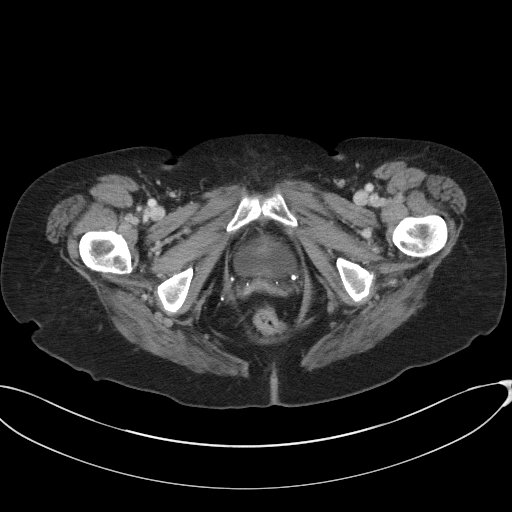
[im 21/96  soft-tissue]
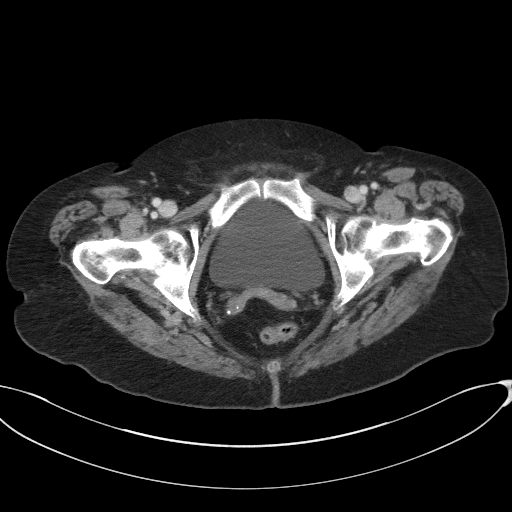
[im 26/96  soft-tissue]
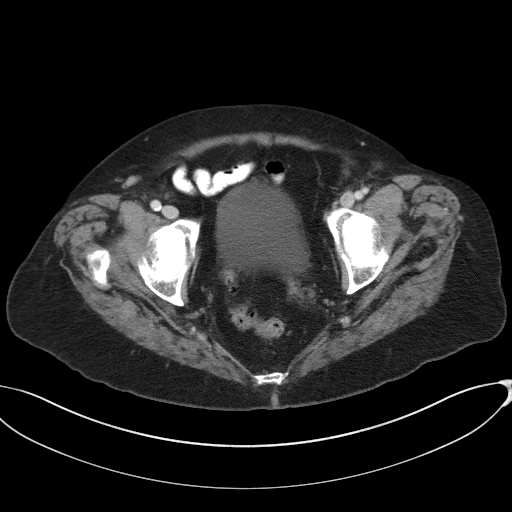
[im 36/96  soft-tissue]
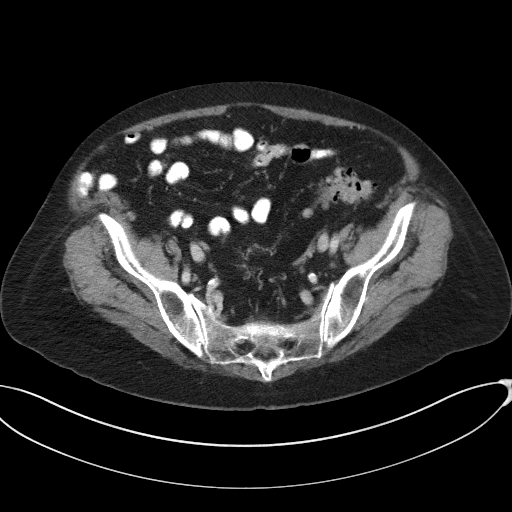
[im 41/96  soft-tissue]
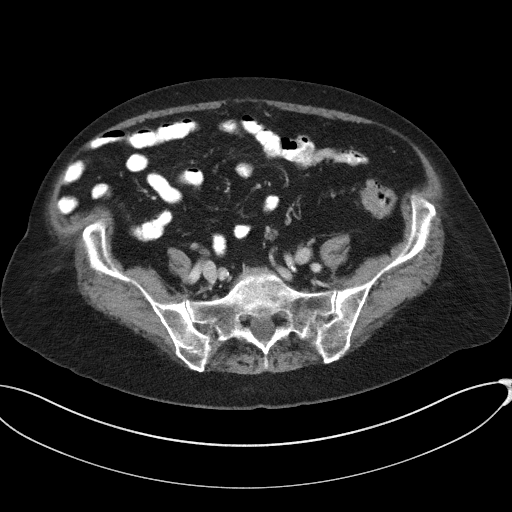
[im 51/96  soft-tissue]
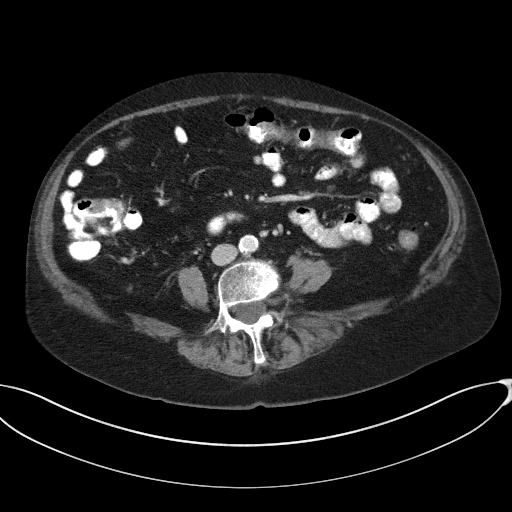
[im 56/96  soft-tissue]
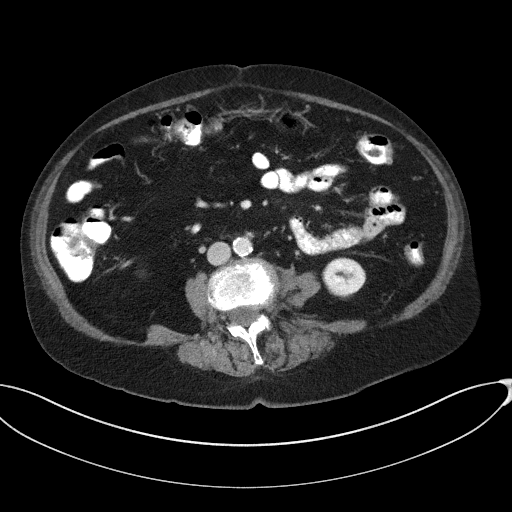
[im 61/96  soft-tissue]
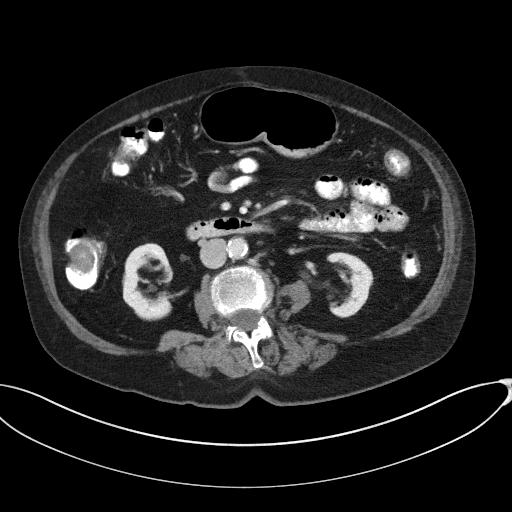
[im 61/96  bone]
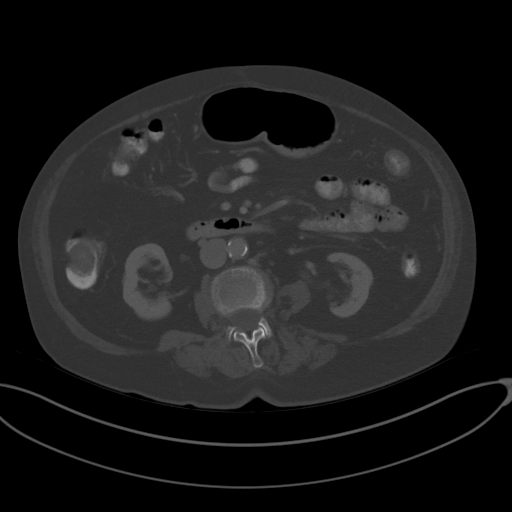
[im 71/96  soft-tissue]
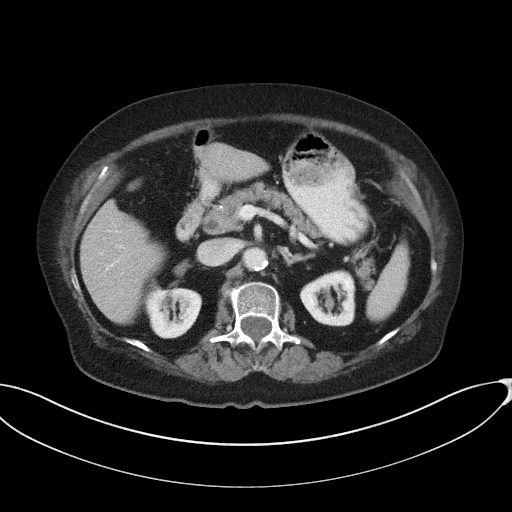
[im 76/96  soft-tissue]
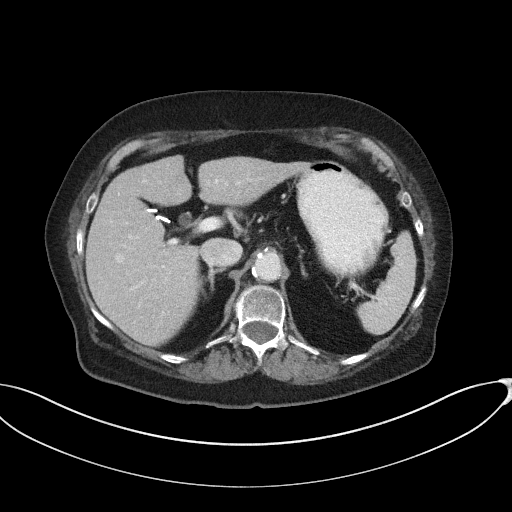
[im 81/96  soft-tissue]
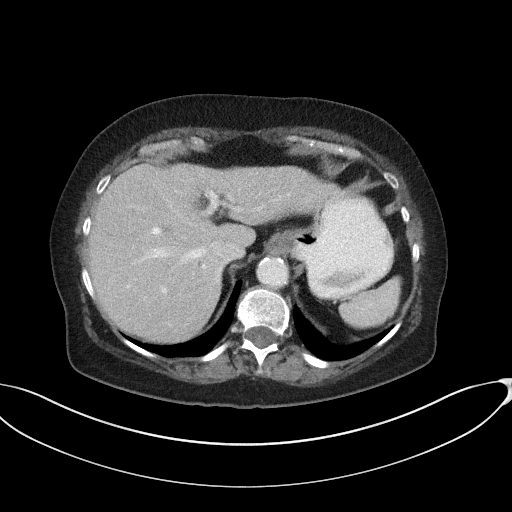
[im 91/96  soft-tissue]
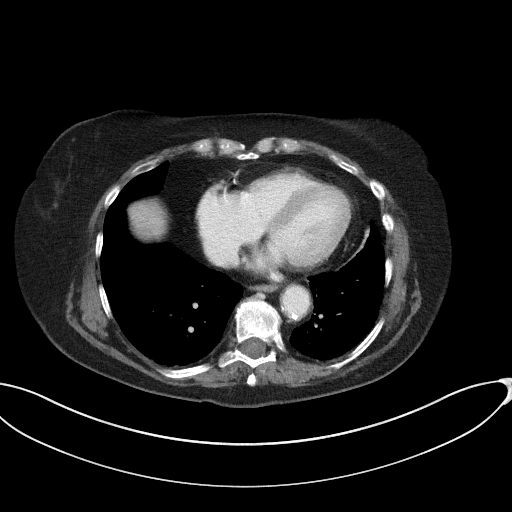

[Series 5: coronal st · coronal · 0.89mm/px · 3 of 92 slices shown]
[im 31/92  soft-tissue]
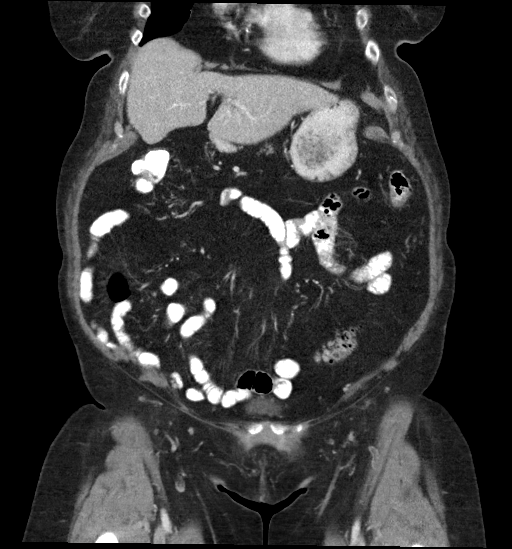
[im 41/92  soft-tissue]
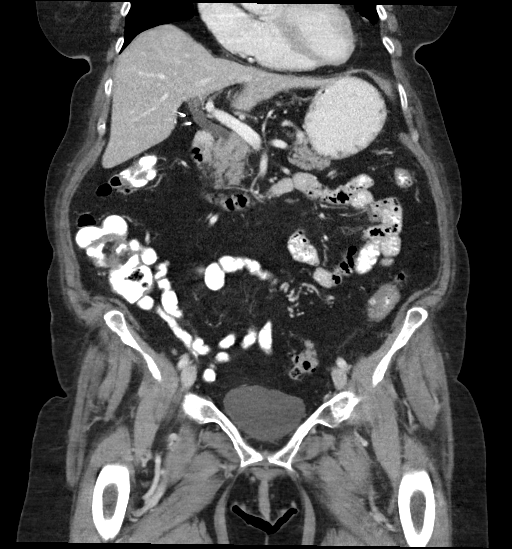
[im 51/92  soft-tissue]
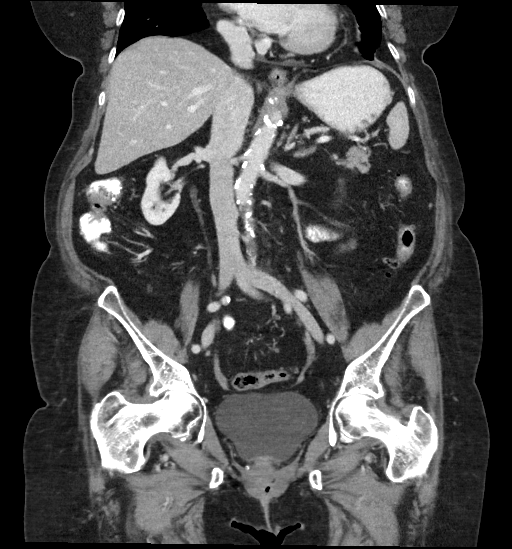

[16 of 46 positions shown; findings below may reference images not displayed]

FINDINGS: Lower chest: Lung bases demonstrate no acute consolidation or
pleural effusion. Mild cardiomegaly. Coronary vascular
calcification.

Hepatobiliary: Status post cholecystectomy. Mild extrahepatic
biliary dilatation without change likely due to post cholecystectomy
status.

Pancreas: Unremarkable. No pancreatic ductal dilatation or
surrounding inflammatory changes.

Spleen: Normal in size without focal abnormality.

Adrenals/Urinary Tract: Adrenal glands are normal. Kidneys show no
hydronephrosis. Cyst within the mid left kidney. Additional
subcentimeter hypodensities too small to further characterize. The
bladder is.

Stomach/Bowel: The stomach is nonenlarged. No dilated small bowel.
Diverticular disease of the left colon without acute inflammatory
change. Negative appendix. Focal area of thickening and narrowing
involving the ascending colon, series 2, image number 37, series 5,
image number 49. Similar soft tissue fullness of the left anal
rectal region.

Vascular/Lymphatic: Moderate aortic atherosclerosis. No aneurysm. No
suspicious nodes.

Reproductive: Status post hysterectomy. No adnexal masses.

Other: Negative for free air or free fluid

Musculoskeletal: No acute or significant osseous findings.
IMPRESSION: 1. No CT evidence for acute intra-abdominal or pelvic abnormality.
2. Diverticular disease of the left colon without acute inflammatory
change.
3. Focal area of thickening and narrowed appearance at the ascending
colon, focal mass lesion cannot be excluded. Suggest correlation
with colonoscopy when clinically feasible.
4. Similar mild soft tissue fullness of the left anal rectal region
which may be correlated with direct inspection.

Aortic Atherosclerosis ([32]-[32]).

## 2019-11-23 MED ORDER — IOHEXOL 300 MG/ML  SOLN
100.0000 mL | Freq: Once | INTRAMUSCULAR | Status: AC | PRN
  Administered 2019-11-23: 100 mL via INTRAVENOUS

## 2019-12-01 ENCOUNTER — Other Ambulatory Visit: Payer: Self-pay

## 2019-12-01 ENCOUNTER — Other Ambulatory Visit
Admission: RE | Admit: 2019-12-01 | Discharge: 2019-12-01 | Disposition: A | Discharge status: Home / Self Care | Payer: Medicare Other | Source: Ambulatory Visit | Attending: Gastroenterology | Admitting: Gastroenterology

## 2019-12-01 DIAGNOSIS — Z20822 Contact with and (suspected) exposure to covid-19: Secondary | ICD-10-CM | POA: Diagnosis present

## 2019-12-01 LAB — SARS CORONAVIRUS 2 (TAT 6-24 HRS): SARS Coronavirus 2: NEGATIVE

## 2019-12-02 ENCOUNTER — Encounter: Payer: Self-pay | Admitting: *Deleted

## 2019-12-05 ENCOUNTER — Ambulatory Visit: Payer: Medicare Other | Admitting: Certified Registered"

## 2019-12-05 ENCOUNTER — Ambulatory Visit
Admission: RE | Admit: 2019-12-05 | Discharge: 2019-12-05 | Disposition: A | Discharge status: Home / Self Care | Payer: Medicare Other | Attending: Gastroenterology | Admitting: Gastroenterology

## 2019-12-05 ENCOUNTER — Encounter
Admission: RE | Disposition: A | Discharge status: Home / Self Care | Payer: Self-pay | Source: Home / Self Care | Attending: Gastroenterology

## 2019-12-05 ENCOUNTER — Other Ambulatory Visit: Payer: Self-pay

## 2019-12-05 ENCOUNTER — Encounter: Payer: Self-pay | Admitting: *Deleted

## 2019-12-05 DIAGNOSIS — I2510 Atherosclerotic heart disease of native coronary artery without angina pectoris: Secondary | ICD-10-CM | POA: Diagnosis not present

## 2019-12-05 DIAGNOSIS — R1013 Epigastric pain: Secondary | ICD-10-CM | POA: Diagnosis not present

## 2019-12-05 DIAGNOSIS — K5730 Diverticulosis of large intestine without perforation or abscess without bleeding: Secondary | ICD-10-CM | POA: Diagnosis not present

## 2019-12-05 DIAGNOSIS — M1990 Unspecified osteoarthritis, unspecified site: Secondary | ICD-10-CM | POA: Diagnosis not present

## 2019-12-05 DIAGNOSIS — K648 Other hemorrhoids: Secondary | ICD-10-CM | POA: Insufficient documentation

## 2019-12-05 DIAGNOSIS — Z85828 Personal history of other malignant neoplasm of skin: Secondary | ICD-10-CM | POA: Diagnosis not present

## 2019-12-05 DIAGNOSIS — Z8371 Family history of colonic polyps: Secondary | ICD-10-CM | POA: Diagnosis not present

## 2019-12-05 DIAGNOSIS — I739 Peripheral vascular disease, unspecified: Secondary | ICD-10-CM | POA: Insufficient documentation

## 2019-12-05 DIAGNOSIS — R197 Diarrhea, unspecified: Secondary | ICD-10-CM | POA: Insufficient documentation

## 2019-12-05 DIAGNOSIS — Z7982 Long term (current) use of aspirin: Secondary | ICD-10-CM | POA: Insufficient documentation

## 2019-12-05 DIAGNOSIS — K6389 Other specified diseases of intestine: Secondary | ICD-10-CM | POA: Insufficient documentation

## 2019-12-05 DIAGNOSIS — Q402 Other specified congenital malformations of stomach: Secondary | ICD-10-CM | POA: Insufficient documentation

## 2019-12-05 DIAGNOSIS — E039 Hypothyroidism, unspecified: Secondary | ICD-10-CM | POA: Diagnosis not present

## 2019-12-05 DIAGNOSIS — K529 Noninfective gastroenteritis and colitis, unspecified: Secondary | ICD-10-CM | POA: Diagnosis not present

## 2019-12-05 DIAGNOSIS — D120 Benign neoplasm of cecum: Secondary | ICD-10-CM | POA: Insufficient documentation

## 2019-12-05 DIAGNOSIS — I10 Essential (primary) hypertension: Secondary | ICD-10-CM | POA: Insufficient documentation

## 2019-12-05 DIAGNOSIS — K219 Gastro-esophageal reflux disease without esophagitis: Secondary | ICD-10-CM | POA: Diagnosis not present

## 2019-12-05 DIAGNOSIS — K317 Polyp of stomach and duodenum: Secondary | ICD-10-CM | POA: Insufficient documentation

## 2019-12-05 DIAGNOSIS — Z8673 Personal history of transient ischemic attack (TIA), and cerebral infarction without residual deficits: Secondary | ICD-10-CM | POA: Insufficient documentation

## 2019-12-05 DIAGNOSIS — Z79899 Other long term (current) drug therapy: Secondary | ICD-10-CM | POA: Diagnosis not present

## 2019-12-05 DIAGNOSIS — K2950 Unspecified chronic gastritis without bleeding: Secondary | ICD-10-CM | POA: Insufficient documentation

## 2019-12-05 DIAGNOSIS — D124 Benign neoplasm of descending colon: Secondary | ICD-10-CM | POA: Insufficient documentation

## 2019-12-05 DIAGNOSIS — D122 Benign neoplasm of ascending colon: Secondary | ICD-10-CM | POA: Diagnosis not present

## 2019-12-05 DIAGNOSIS — R933 Abnormal findings on diagnostic imaging of other parts of digestive tract: Secondary | ICD-10-CM | POA: Insufficient documentation

## 2019-12-05 DIAGNOSIS — Z79890 Hormone replacement therapy (postmenopausal): Secondary | ICD-10-CM | POA: Insufficient documentation

## 2019-12-05 HISTORY — PX: ESOPHAGOGASTRODUODENOSCOPY (EGD) WITH PROPOFOL: SHX5813

## 2019-12-05 HISTORY — DX: Cardiac murmur, unspecified: R01.1

## 2019-12-05 HISTORY — DX: Gastro-esophageal reflux disease without esophagitis: K21.9

## 2019-12-05 HISTORY — DX: Hypothyroidism, unspecified: E03.9

## 2019-12-05 HISTORY — DX: Peripheral vascular disease, unspecified: I73.9

## 2019-12-05 HISTORY — DX: Transient cerebral ischemic attack, unspecified: G45.9

## 2019-12-05 HISTORY — PX: COLONOSCOPY WITH PROPOFOL: SHX5780

## 2019-12-05 HISTORY — DX: Atherosclerotic heart disease of native coronary artery without angina pectoris: I25.10

## 2019-12-05 SURGERY — COLONOSCOPY WITH PROPOFOL
Anesthesia: General

## 2019-12-05 MED ORDER — GLYCOPYRROLATE 0.2 MG/ML IJ SOLN
INTRAMUSCULAR | Status: DC | PRN
Start: 2019-12-05 — End: 2019-12-05
  Administered 2019-12-05: .2 mg via INTRAVENOUS

## 2019-12-05 MED ORDER — PROPOFOL 500 MG/50ML IV EMUL
INTRAVENOUS | Status: DC | PRN
  Administered 2019-12-05: 145 ug/kg/min via INTRAVENOUS

## 2019-12-05 MED ORDER — PROPOFOL 10 MG/ML IV BOLUS
INTRAVENOUS | Status: DC | PRN
  Administered 2019-12-05: 10 mg via INTRAVENOUS
  Administered 2019-12-05: 50 mg via INTRAVENOUS

## 2019-12-05 MED ORDER — PHENYLEPHRINE HCL (PRESSORS) 10 MG/ML IV SOLN
INTRAVENOUS | Status: DC | PRN
  Administered 2019-12-05 (×2): 100 ug via INTRAVENOUS

## 2019-12-05 MED ORDER — LIDOCAINE HCL (CARDIAC) PF 100 MG/5ML IV SOSY
PREFILLED_SYRINGE | INTRAVENOUS | Status: DC | PRN
  Administered 2019-12-05: 100 mg via INTRAVENOUS

## 2019-12-05 MED ORDER — SODIUM CHLORIDE 0.9 % IV SOLN: INTRAVENOUS | Status: DC

## 2019-12-05 NOTE — Interval H&P Note (Signed)
History and Physical Interval Note:  12/05/2019 9:48 AM  Laura Cole  has presented today for surgery, with the diagnosis of GERD ABNORMAL CT.  The various methods of treatment have been discussed with the patient and family. After consideration of risks, benefits and other options for treatment, the patient has consented to  Procedure(s): COLONOSCOPY WITH PROPOFOL (N/A) ESOPHAGOGASTRODUODENOSCOPY (EGD) WITH PROPOFOL (N/A) as a surgical intervention.  The patient's history has been reviewed, patient examined, no change in status, stable for surgery.  I have reviewed the patient's chart and labs.  Questions were answered to the patient's satisfaction.     Lesly Rubenstein  Ok to proceed with EGD/Colonoscopy

## 2019-12-05 NOTE — Anesthesia Preprocedure Evaluation (Signed)
Anesthesia Evaluation  Patient identified by MRN, date of birth, ID band Patient awake    Reviewed: Allergy & Precautions, H&P , NPO status , Patient's Chart, lab work & pertinent test results  History of Anesthesia Complications (+) Emergence Delirium and history of anesthetic complications  Airway Mallampati: III  TM Distance: <3 FB Neck ROM: limited    Dental  (+) Chipped   Pulmonary neg pulmonary ROS, neg shortness of breath,    Pulmonary exam normal        Cardiovascular Exercise Tolerance: Good hypertension, (-) angina+ CAD and + Peripheral Vascular Disease  (-) DOE Normal cardiovascular exam+ Valvular Problems/Murmurs      Neuro/Psych TIAnegative psych ROS   GI/Hepatic Neg liver ROS, GERD  Medicated and Controlled,  Endo/Other  Hypothyroidism   Renal/GU negative Renal ROS  negative genitourinary   Musculoskeletal  (+) Arthritis ,   Abdominal   Peds  Hematology negative hematology ROS (+)   Anesthesia Other Findings Past Medical History: No date: Bradycardia No date: Cancer ([place])     Comment:  skin cancer No date: Coronary artery disease No date: Diverticulitis No date: GERD (gastroesophageal reflux disease) No date: Heart murmur No date: Hypertension No date: Hypothyroidism No date: Osteoarthritis No date: Peripheral vascular disease ([facility]) No date: TIA (transient ischemic attack)  Past Surgical History: No date: ABDOMINAL HYSTERECTOMY No date: BACK SURGERY No date: CHOLECYSTECTOMY No date: COLON SURGERY No date: EYE SURGERY No date: RECTOCELE REPAIR No date: RECTOCELE REPAIR No date: REPLACEMENT TOTAL KNEE; Bilateral No date: [id number]; Bilateral No date: WRIST SURGERY     Reproductive/Obstetrics negative OB ROS                             Anesthesia Physical Anesthesia Plan  ASA: III  Anesthesia Plan: General   Post-op Pain Management:    Induction:  Intravenous  PONV Risk Score and Plan: Propofol infusion and TIVA  Airway Management Planned: Natural Airway and Nasal Cannula  Additional Equipment:   Intra-op Plan:   Post-operative Plan:   Informed Consent: I have reviewed the patients History and Physical, chart, labs and discussed the procedure including the risks, benefits and alternatives for the proposed anesthesia with the patient or authorized representative who has indicated his/her understanding and acceptance.     Dental Advisory Given  Plan Discussed with: Anesthesiologist, CRNA and Surgeon  Anesthesia Plan Comments: (Patient consented for risks of anesthesia including but not limited to:  - adverse reactions to medications - risk of intubation if required - damage to eyes, teeth, lips or other oral mucosa - nerve damage due to positioning  - sore throat or hoarseness - Damage to heart, brain, nerves, lungs, other parts of body or loss of life  Patient voiced understanding.)        Anesthesia Quick Evaluation

## 2019-12-05 NOTE — Anesthesia Postprocedure Evaluation (Signed)
Anesthesia Post Note  Patient: Laura Cole  Procedure(s) Performed: COLONOSCOPY WITH PROPOFOL (N/A ) ESOPHAGOGASTRODUODENOSCOPY (EGD) WITH PROPOFOL (N/A )  Patient location during evaluation: Endoscopy Anesthesia Type: General Level of consciousness: awake and alert Pain management: pain level controlled Vital Signs Assessment: post-procedure vital signs reviewed and stable Respiratory status: spontaneous breathing, nonlabored ventilation, respiratory function stable and patient connected to nasal cannula oxygen Cardiovascular status: blood pressure returned to baseline and stable Postop Assessment: no apparent nausea or vomiting Anesthetic complications: no   No complications documented.   Last Vitals:  Vitals:   12/05/19 1138 12/05/19 1148  BP: 140/61 110/71  Pulse: 63 74  Resp: (!) 25 14  Temp:    SpO2: 100% 100%    Last Pain:  Vitals:   12/05/19 1148  TempSrc:   PainSc: 5                  Precious Haws Athalene Kolle

## 2019-12-05 NOTE — Anesthesia Procedure Notes (Signed)
Procedure Name: General with mask airway Performed by: Fletcher-Harrison, Trishia Cuthrell, CRNA Pre-anesthesia Checklist: Patient identified, Emergency Drugs available, Suction available and Patient being monitored Patient Re-evaluated:Patient Re-evaluated prior to induction Oxygen Delivery Method: Simple face mask Induction Type: IV induction Placement Confirmation: positive ETCO2 and CO2 detector Dental Injury: Teeth and Oropharynx as per pre-operative assessment        

## 2019-12-05 NOTE — Transfer of Care (Signed)
Immediate Anesthesia Transfer of Care Note  Patient: Maleni Seyer  Procedure(s) Performed: COLONOSCOPY WITH PROPOFOL (N/A ) ESOPHAGOGASTRODUODENOSCOPY (EGD) WITH PROPOFOL (N/A )  Patient Location: Endoscopy Unit  Anesthesia Type:General  Level of Consciousness: drowsy and patient cooperative  Airway & Oxygen Therapy: Patient Spontanous Breathing and Patient connected to face mask oxygen  Post-op Assessment: Report given to RN and Post -op Vital signs reviewed and stable  Post vital signs: Reviewed and stable  Last Vitals:  Vitals Value Taken Time  BP    Temp    Pulse    Resp    SpO2      Last Pain:  Vitals:   12/05/19 0929  TempSrc: Temporal  PainSc: 0-No pain         Complications: No complications documented.

## 2019-12-05 NOTE — OR Nursing (Signed)
Pt reports chest pain is subsiding with burping, pt is sitting on commode to void. Spoke with Dr. Amie Critchley, he is ok with pt being dc'd if chest is resolving with burping.

## 2019-12-05 NOTE — OR Nursing (Signed)
Dr. Amie Critchley into see pt, VSS, ECG NRS, pt continues to C/O right chest pain, she has been burping frequently.  Dr. Amie Critchley reassured pt he felt this was gass from the procedure as well.

## 2019-12-05 NOTE — OR Nursing (Signed)
Patient c/o dull chest pain, located mid right.  Dr. Haig Prophet in with pt, he reassured pt he thought it was gas pain from procedure.  Will contact Dr. Amie Critchley.

## 2019-12-05 NOTE — Op Note (Signed)
[facility] Gastroenterology Patient Name: Laura Cole Procedure Date: 12/05/2019 9:32 AM MRN: [id number] Account #: [id number] Date of Birth: [birth year] Admit Type: Outpatient Age: 79 Room: [facility] ENDO ROOM 2 Gender: Female Note Status: Finalized Procedure:             Upper GI endoscopy Indications:           Dyspepsia, Heartburn Providers:             Andrey Farmer MD, MD Medicines:             Monitored Anesthesia Care Complications:         No immediate complications. Estimated blood loss:                         Minimal. Procedure:             Pre-Anesthesia Assessment:                        - Prior to the procedure, a History and Physical was                         performed, and patient medications and allergies were                         reviewed. The patient is competent. The risks and                         benefits of the procedure and the sedation options and                         risks were discussed with the patient. All questions                         were answered and informed consent was obtained.                         Patient identification and proposed procedure were                         verified by the physician, the nurse, the anesthetist                         and the technician in the endoscopy suite. Mental                         Status Examination: alert and oriented. Airway                         Examination: normal oropharyngeal airway and neck                         mobility. Respiratory Examination: clear to                         auscultation. CV Examination: normal. Prophylactic                         Antibiotics: The patient does not require prophylactic  antibiotics. Prior Anticoagulants: The patient has                         taken no previous anticoagulant or antiplatelet                         agents. ASA Grade Assessment: II - A patient with mild                         systemic  disease. After reviewing the risks and                         benefits, the patient was deemed in satisfactory                         condition to undergo the procedure. The anesthesia                         plan was to use monitored anesthesia care (MAC).                         Immediately prior to administration of medications,                         the patient was re-assessed for adequacy to receive                         sedatives. The heart rate, respiratory rate, oxygen                         saturations, blood pressure, adequacy of pulmonary                         ventilation, and response to care were monitored                         throughout the procedure. The physical status of the                         patient was re-assessed after the procedure.                        After obtaining informed consent, the endoscope was                         passed under direct vision. Throughout the procedure,                         the patient's blood pressure, pulse, and oxygen                         saturations were monitored continuously. The Endoscope                         was introduced through the mouth, and advanced to the                         second part of duodenum. The upper GI endoscopy was  accomplished without difficulty. The patient tolerated                         the procedure well. Findings:      The examined esophagus was normal. Biopsies were obtained from the       proximal and distal esophagus with cold forceps for histology of       suspected eosinophilic esophagitis.      Patchy mildly erythematous mucosa without bleeding was found in the       gastric antrum. Biopsies were taken with a cold forceps for Helicobacter       pylori testing. Estimated blood loss was minimal.      A single 4 mm sessile polyp with no bleeding and no stigmata of recent       bleeding was found in the gastric body. The polyp was removed with a        jumbo cold forceps. Resection and retrieval were complete. Estimated       blood loss was minimal.      Two 1 mm sessile polyps were found in the duodenal bulb. The polyp was       removed with a jumbo cold forceps. Resection and retrieval were       complete. Estimated blood loss was minimal.      The exam of the duodenum was otherwise normal. Impression:            - Normal esophagus. Biopsied.                        - Erythematous mucosa in the antrum. Biopsied.                        - A single gastric polyp. Resected and retrieved.                        - Two duodenal polyps. Resected and retrieved. Recommendation:        - NPO.                        - Continue present medications.                        - Await pathology results.                        - Perform a colonoscopy today. Procedure Code(s):     --- Professional ---                        [phone], Esophagogastroduodenoscopy, flexible,                         transoral; with biopsy, single or multiple Diagnosis Code(s):     --- Professional ---                        K31.89, Other diseases of stomach and duodenum                        K31.7, Polyp of stomach and duodenum                        R10.13, Epigastric  pain                        R12, Heartburn CPT copyright 2019 [employer]. All rights reserved. The codes documented in this report are preliminary and upon coder review may  be revised to meet current compliance requirements. Andrey Farmer, MD Andrey Farmer MD, MD 12/05/2019 10:50:45 AM Number of Addenda: 0 Note Initiated On: 12/05/2019 9:32 AM Estimated Blood Loss:  Estimated blood loss was minimal.      [facility]

## 2019-12-05 NOTE — H&P (Signed)
Outpatient short stay form Pre-procedure 12/05/2019 9:38 AM Laura Miyamoto MD, MPH  Primary Physician: Dr. Edwina Barth  Reason for visit:  Diarrhea and abdominal pain/abnormal imaging  History of present illness:  79 y/o lady with new onset diarrhea with negative stool studies and abnormal ct showing colinic thickening. Also with dyspepsia. Here for EGD/Colonoscopy. History of hysterectomy. No blood thinners.    Current Facility-Administered Medications:    0.9 %  sodium chloride infusion, , Intravenous, Continuous, [place], Benay Pike, MD  Medications Prior to Admission  Medication Sig Dispense Refill Last Dose   amLODipine (NORVASC) 5 MG tablet    12/05/2019 at Unknown time   famotidine (PEPCID) 20 MG tablet Take 20 mg by mouth 2 (two) times daily.   12/04/2019 at Unknown time   levothyroxine (SYNTHROID) 50 MCG tablet Take 50 mcg by mouth daily before breakfast.      metoprolol tartrate (LOPRESSOR) 25 MG tablet Take by mouth.   12/05/2019 at Unknown time   aspirin 81 MG chewable tablet Chew by mouth. (Patient not taking: Reported on 12/05/2019)   Not Taking at Unknown time   Cholecalciferol (VITAMIN D-1000 MAX ST) 25 MCG (1000 UT) tablet Take by mouth. (Patient not taking: Reported on 12/05/2019)   Not Taking at Unknown time   clobetasol (TEMOVATE) 0.05 % external solution Mix 71mL into 1# jar of CeraVe and apply daily as directed        Allergies  Allergen Reactions   Mushroom Extract Complex Nausea Only and Nausea And Vomiting    Other reaction(s): Vomiting   Promethazine     Other reaction(s): Unknown   Alprazolam     Other reaction(s): Other (See Comments)   Cefdinir Nausea Only   Ciprofloxacin     Other reaction(s): Other (See Comments)   Dilaudid [Hydromorphone Hcl]    Hydromorphone    Phenergan [Promethazine Hcl]    Pravastatin     Other reaction(s): Muscle Pain     Past Medical History:  Diagnosis Date   Bradycardia    Cancer ([facility]CC)    skin cancer    Coronary artery disease    Diverticulitis    GERD (gastroesophageal reflux disease)    Heart murmur    Hypertension    Hypothyroidism    Osteoarthritis    Peripheral vascular disease (HCC)    TIA (transient ischemic attack)     Review of systems:  Otherwise negative.    Physical Exam  Gen: Alert, oriented. Appears stated age.  HEENT: PERRLA. Lungs: No respiratory distress Abd: soft, benign, no masses Ext: No edema.    Planned procedures: Proceed with EGD/colonoscopy. The patient understands the nature of the planned procedure, indications, risks, alternatives and potential complications including but not limited to bleeding, infection, perforation, damage to internal organs and possible oversedation/side effects from anesthesia. The patient agrees and gives consent to proceed.  Please refer to procedure notes for findings, recommendations and patient disposition/instructions.     Laura Miyamoto MD, MPH Gastroenterology 12/05/2019  9:38 AM

## 2019-12-05 NOTE — Op Note (Signed)
[facility] Gastroenterology Patient Name: Laura Cole Procedure Date: 12/05/2019 9:32 AM MRN: [id number] Account #: [id number] Date of Birth: 1941/05/02 Admit Type: Outpatient Age: 79 Room: [facility] ENDO ROOM 2 Gender: Female Note Status: Finalized Procedure:             Colonoscopy Indications:           Family history of advanced adenomas of the colon in                         multiple first-degree relatives, Chronic diarrhea,                         Abnormal CT of the GI tract Providers:             Andrey Farmer MD, MD Medicines:             Monitored Anesthesia Care Complications:         No immediate complications. Estimated blood loss:                         Minimal. Procedure:             Pre-Anesthesia Assessment:                        - Prior to the procedure, a History and Physical was                         performed, and patient medications and allergies were                         reviewed. The patient is competent. The risks and                         benefits of the procedure and the sedation options and                         risks were discussed with the patient. All questions                         were answered and informed consent was obtained.                         Patient identification and proposed procedure were                         verified by the physician, the nurse, the anesthetist                         and the technician in the endoscopy suite. Mental                         Status Examination: alert and oriented. Airway                         Examination: normal oropharyngeal airway and neck                         mobility. Respiratory Examination: clear to  auscultation. CV Examination: normal. Prophylactic                         Antibiotics: The patient does not require prophylactic                         antibiotics. Prior Anticoagulants: The patient has                         taken no  previous anticoagulant or antiplatelet                         agents. ASA Grade Assessment: II - A patient with mild                         systemic disease. After reviewing the risks and                         benefits, the patient was deemed in satisfactory                         condition to undergo the procedure. The anesthesia                         plan was to use monitored anesthesia care (MAC).                         Immediately prior to administration of medications,                         the patient was re-assessed for adequacy to receive                         sedatives. The heart rate, respiratory rate, oxygen                         saturations, blood pressure, adequacy of pulmonary                         ventilation, and response to care were monitored                         throughout the procedure. The physical status of the                         patient was re-assessed after the procedure.                        After obtaining informed consent, the colonoscope was                         passed under direct vision. Throughout the procedure,                         the patient's blood pressure, pulse, and oxygen                         saturations were monitored continuously. The  Colonoscope was introduced through the anus and                         advanced to the 10 cm into the ileum. The colonoscopy                         was technically difficult and complex due to                         restricted mobility of the colon. Successful                         completion of the procedure was aided by withdrawing                         the scope and replacing with the pediatric endoscope.                         The patient tolerated the procedure well. The quality                         of the bowel preparation was good. Findings:      The perianal and digital rectal examinations were normal.      The terminal ileum appeared  normal.      A 1 mm polyp was found in the cecum. The polyp was sessile. The polyp       was removed with a jumbo cold forceps. Resection and retrieval were       complete. Estimated blood loss was minimal.      A 9 mm polyp was found in the ascending colon. The polyp was sessile.       The polyp was removed with a cold snare. Resection and retrieval were       complete. Estimated blood loss was minimal.      An area of mildly congested mucosa was found in the ascending colon.       Biopsies were taken with a cold forceps for histology. Estimated blood       loss was minimal.      A 3 mm polyp was found in the descending colon. The polyp was sessile.       The polyp was removed with a jumbo cold forceps. Resection and retrieval       were complete. Estimated blood loss was minimal.      Multiple small-mouthed diverticula were found in the sigmoid colon.      Internal hemorrhoids were found during retroflexion. The hemorrhoids       were small.      The exam was otherwise normal throughout the examined colon.      Biopsies for histology were taken with a cold forceps from the entire       colon for evaluation of microscopic colitis. Estimated blood loss was       minimal. Impression:            - The examined portion of the ileum was normal.                        - One 1 mm polyp in the cecum, removed with a jumbo  cold forceps. Resected and retrieved.                        - One 9 mm polyp in the ascending colon, removed with                         a cold snare. Resected and retrieved.                        - Congested mucosa in the ascending colon. Biopsied.                        - One 3 mm polyp in the descending colon, removed with                         a jumbo cold forceps. Resected and retrieved.                        - Diverticulosis in the sigmoid colon.                        - Internal hemorrhoids.                        - Biopsies were taken with  a cold forceps from the                         entire colon for evaluation of microscopic colitis. Recommendation:        - Discharge patient to home.                        - Resume previous diet.                        - Continue present medications.                        - Await pathology results.                        - Repeat colonoscopy in 3 years for surveillance.                        - Return to referring physician as previously                         scheduled. Procedure Code(s):     --- Professional ---                        [phone], Colonoscopy, flexible; with removal of                         tumor(s), polyp(s), or other lesion(s) by snare                         technique                        50539, 59, Colonoscopy, flexible; with biopsy, single  or multiple Diagnosis Code(s):     --- Professional ---                        K64.8, Other hemorrhoids                        K63.5, Polyp of colon                        K63.89, Other specified diseases of intestine                        Z83.71, Family history of colonic polyps                        K52.9, Noninfective gastroenteritis and colitis,                         unspecified                        K57.30, Diverticulosis of large intestine without                         perforation or abscess without bleeding                        R93.3, Abnormal findings on diagnostic imaging of                         other parts of digestive tract CPT copyright 2019 [employer]. All rights reserved. The codes documented in this report are preliminary and upon coder review may  be revised to meet current compliance requirements. Andrey Farmer, MD Andrey Farmer MD, MD 12/05/2019 10:59:45 AM Number of Addenda: 0 Note Initiated On: 12/05/2019 9:32 AM Scope Withdrawal Time: 0 hours 25 minutes 35 seconds  Total Procedure Duration: 0 hours 30 minutes 20 seconds  Estimated Blood  Loss:  Estimated blood loss was minimal.      [facility]

## 2019-12-06 ENCOUNTER — Encounter: Payer: Self-pay | Admitting: Gastroenterology

## 2019-12-06 LAB — SURGICAL PATHOLOGY

## 2020-04-06 ENCOUNTER — Other Ambulatory Visit: Payer: Self-pay | Admitting: Physician Assistant

## 2020-04-06 ENCOUNTER — Ambulatory Visit: Admission: RE | Admit: 2020-04-06 | Payer: Medicare Other | Source: Ambulatory Visit

## 2020-04-06 ENCOUNTER — Other Ambulatory Visit: Payer: Self-pay

## 2020-04-06 ENCOUNTER — Ambulatory Visit
Admission: RE | Admit: 2020-04-06 | Discharge: 2020-04-06 | Disposition: A | Discharge status: Home / Self Care | Payer: Medicare Other | Source: Ambulatory Visit | Attending: Physician Assistant | Admitting: Physician Assistant

## 2020-04-06 DIAGNOSIS — R600 Localized edema: Secondary | ICD-10-CM

## 2020-04-06 IMAGING — US US EXTREM LOW VENOUS*L*
1 series · 13 of 24 positions shown · non-contrast
Comparison: Left lower extremity venous Doppler
ultrasound-[DATE] (negative for acute or chronic DVT).

CLINICAL DATA: History of previous left lower extremity DVT, now
with left lower extremity pain and edema. Evaluate for acute or
chronic DVT.



[Series 1: us venous img lower uni left (dvt) · portal-venous · 13 of 33 slices shown]
[im 1/33]
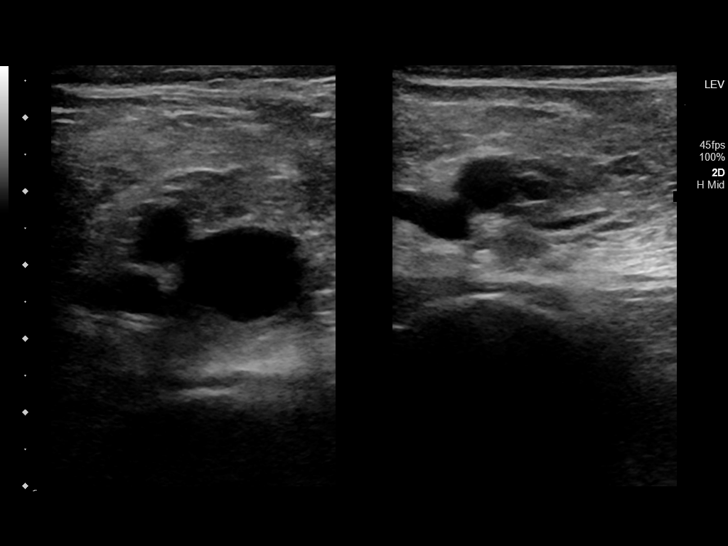
[im 3/33]
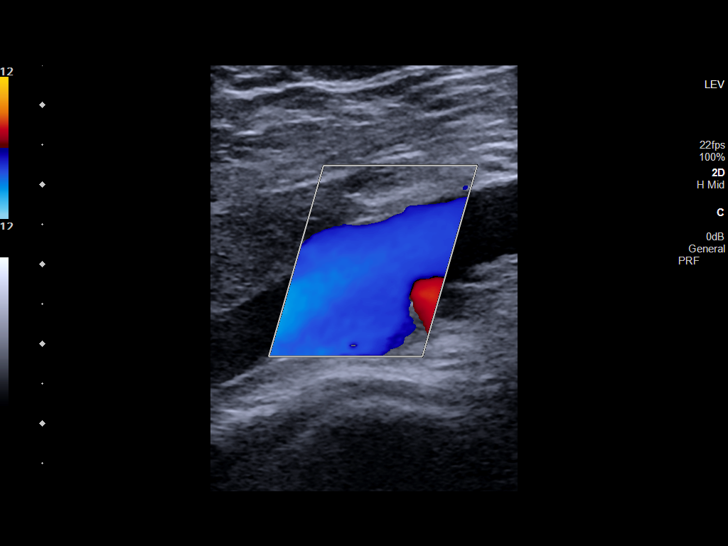
[im 6/33]
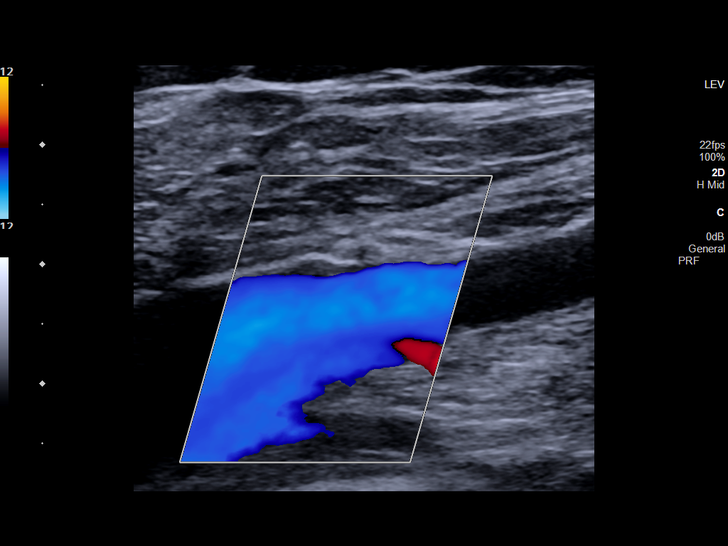
[im 9/33]
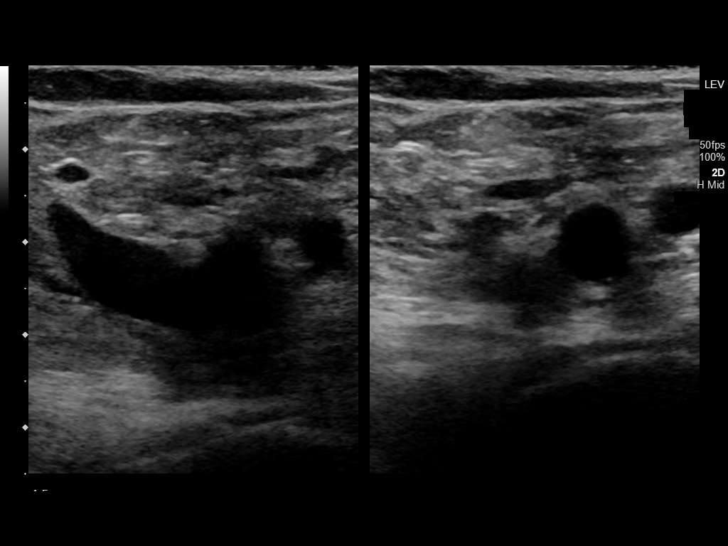
[im 12/33]
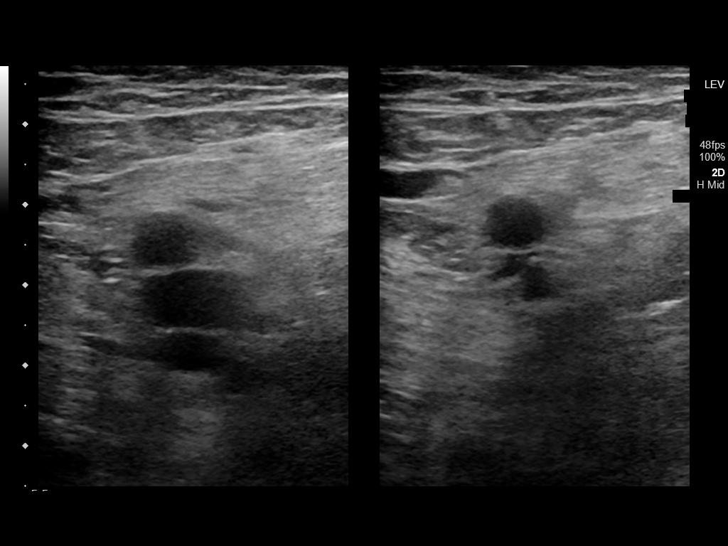
[im 14/33]
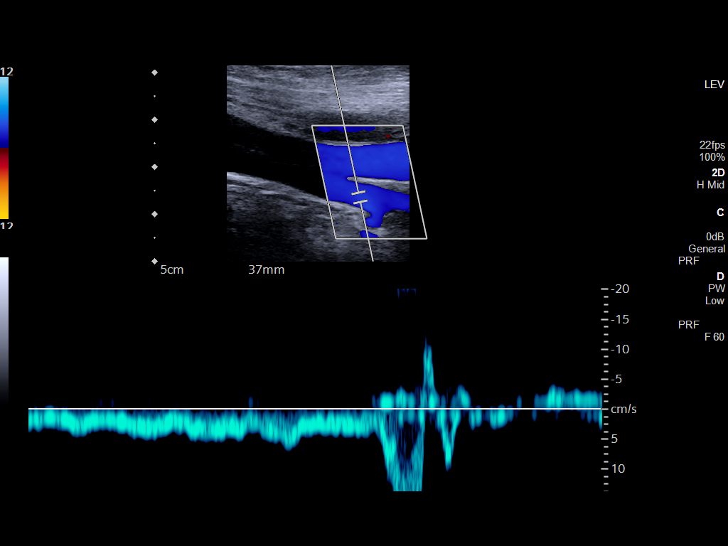
[im 17/33]
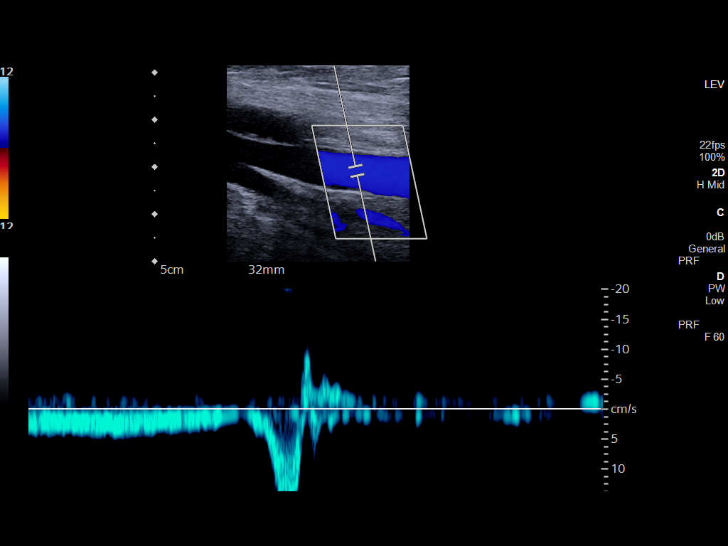
[im 19/33]
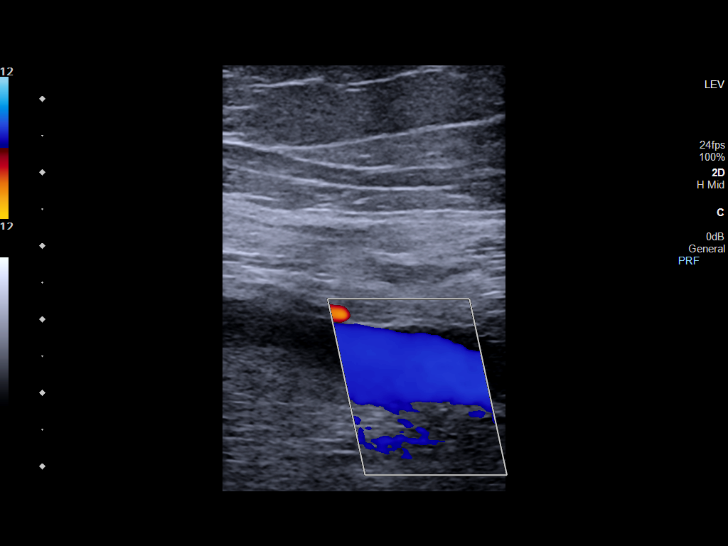
[im 21/33]
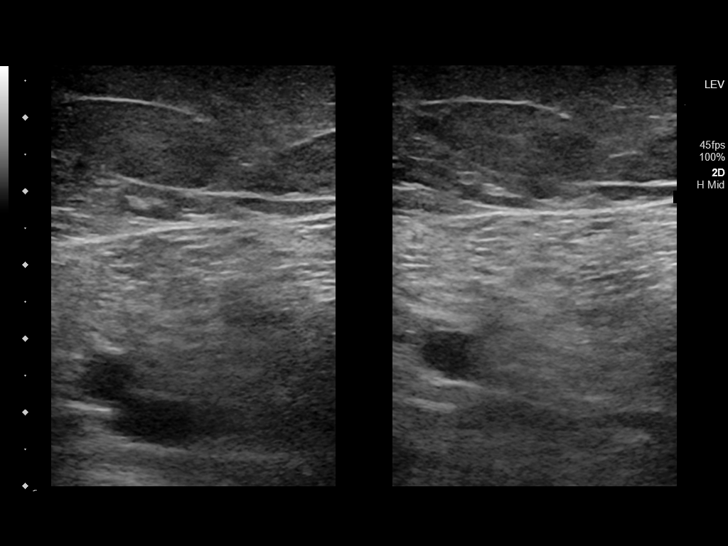
[im 24/33]
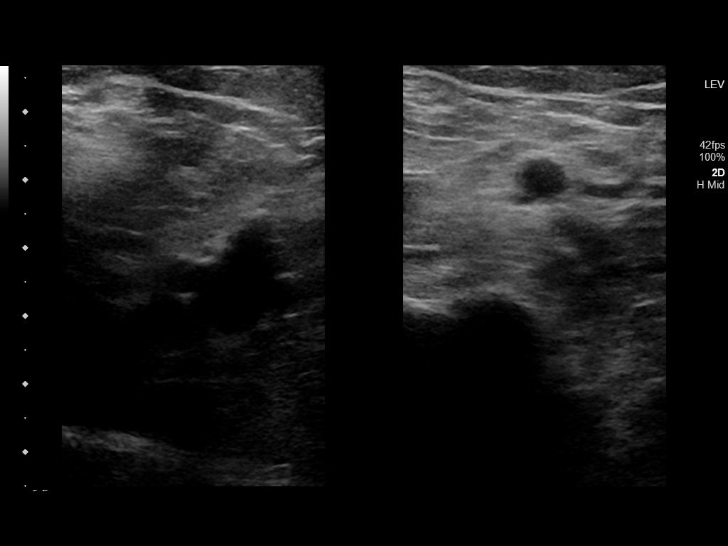
[im 27/33]
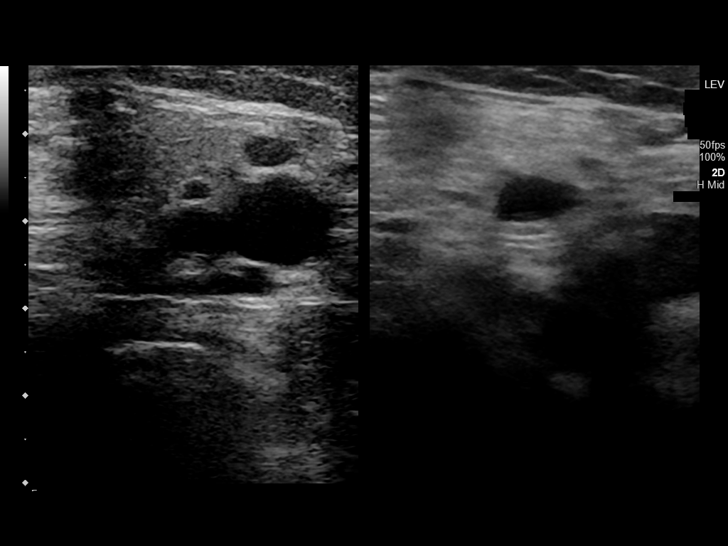
[im 30/33]
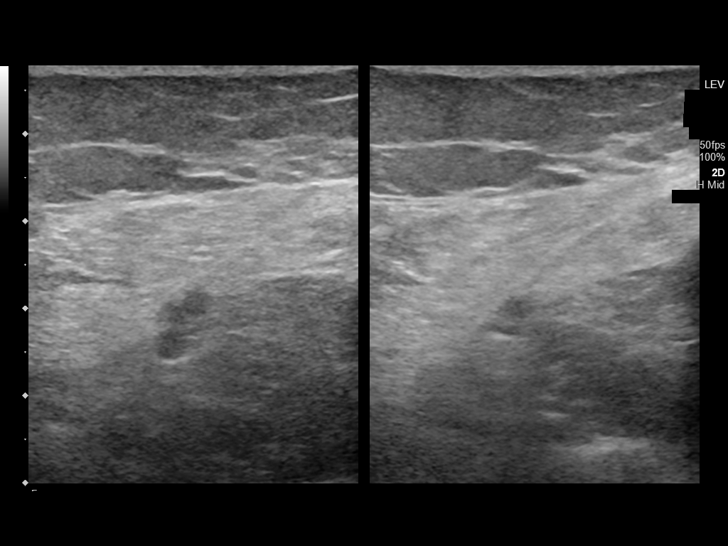
[im 33/33]
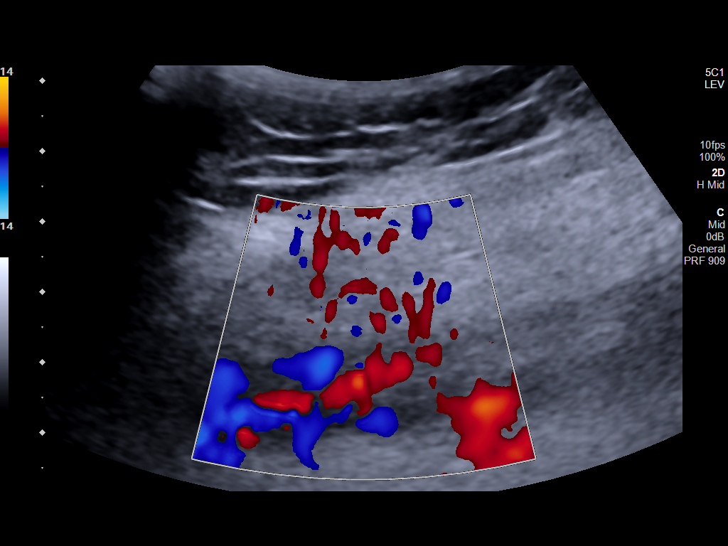

[13 of 24 positions shown; findings below may reference images not displayed]

FINDINGS: Contralateral Common Femoral Vein: Respiratory phasicity is normal
and symmetric with the symptomatic side. No evidence of thrombus.
Normal compressibility.

Common Femoral Vein: No evidence of thrombus. Normal
compressibility, respiratory phasicity and response to augmentation.

Saphenofemoral Junction: No evidence of thrombus. Normal
compressibility and flow on color Doppler imaging.

Profunda Femoral Vein: No evidence of thrombus. Normal
compressibility and flow on color Doppler imaging.

Femoral Vein: No evidence of thrombus. Normal compressibility,
respiratory phasicity and response to augmentation.

Popliteal Vein: No evidence of thrombus. Normal compressibility,
respiratory phasicity and response to augmentation.

Calf Veins: No evidence of thrombus. Normal compressibility and flow
on color Doppler imaging.

Superficial Great Saphenous Vein: No evidence of thrombus. Normal
compressibility.

Venous Reflux:  None.

Other Findings:  None.
IMPRESSION: No evidence of DVT within the left lower extremity.

## 2020-04-23 ENCOUNTER — Emergency Department: Payer: Medicare Other

## 2020-04-23 ENCOUNTER — Emergency Department
Admission: EM | Admit: 2020-04-23 | Discharge: 2020-04-23 | Disposition: A | Discharge status: Home / Self Care | Payer: Medicare Other | Attending: Emergency Medicine | Admitting: Emergency Medicine

## 2020-04-23 ENCOUNTER — Other Ambulatory Visit: Payer: Self-pay

## 2020-04-23 ENCOUNTER — Encounter: Payer: Self-pay | Admitting: Emergency Medicine

## 2020-04-23 DIAGNOSIS — Z8673 Personal history of transient ischemic attack (TIA), and cerebral infarction without residual deficits: Secondary | ICD-10-CM | POA: Diagnosis not present

## 2020-04-23 DIAGNOSIS — E039 Hypothyroidism, unspecified: Secondary | ICD-10-CM | POA: Insufficient documentation

## 2020-04-23 DIAGNOSIS — Z7982 Long term (current) use of aspirin: Secondary | ICD-10-CM | POA: Insufficient documentation

## 2020-04-23 DIAGNOSIS — Z85828 Personal history of other malignant neoplasm of skin: Secondary | ICD-10-CM | POA: Diagnosis not present

## 2020-04-23 DIAGNOSIS — I2510 Atherosclerotic heart disease of native coronary artery without angina pectoris: Secondary | ICD-10-CM | POA: Diagnosis not present

## 2020-04-23 DIAGNOSIS — Z79899 Other long term (current) drug therapy: Secondary | ICD-10-CM | POA: Insufficient documentation

## 2020-04-23 DIAGNOSIS — Z96653 Presence of artificial knee joint, bilateral: Secondary | ICD-10-CM | POA: Diagnosis not present

## 2020-04-23 DIAGNOSIS — R079 Chest pain, unspecified: Secondary | ICD-10-CM | POA: Insufficient documentation

## 2020-04-23 DIAGNOSIS — I10 Essential (primary) hypertension: Secondary | ICD-10-CM | POA: Diagnosis not present

## 2020-04-23 LAB — HEPATIC FUNCTION PANEL
ALT: 22 U/L (ref 0–44)
AST: 28 U/L (ref 15–41)
Albumin: 3.8 g/dL (ref 3.5–5.0)
Alkaline Phosphatase: 58 U/L (ref 38–126)
Bilirubin, Direct: <0.1 mg/dL (ref 0.0–0.2)
Total Bilirubin: 0.8 mg/dL (ref 0.3–1.2)
Total Protein: 7.4 g/dL (ref 6.5–8.1)

## 2020-04-23 LAB — CBC
HCT: 41.1 % (ref 36.0–46.0)
Hemoglobin: 13.6 g/dL (ref 12.0–15.0)
MCH: 29.7 pg (ref 26.0–34.0)
MCHC: 33.1 g/dL (ref 30.0–36.0)
MCV: 89.7 fL (ref 80.0–100.0)
Platelets: 246 10*3/uL (ref 150–400)
RBC: 4.58 MIL/uL (ref 3.87–5.11)
RDW: 13.8 % (ref 11.5–15.5)
WBC: 4.9 10*3/uL (ref 4.0–10.5)
nRBC: 0 % (ref 0.0–0.2)

## 2020-04-23 LAB — BASIC METABOLIC PANEL
Anion gap: 11 (ref 5–15)
BUN: 9 mg/dL (ref 8–23)
CO2: 23 mmol/L (ref 22–32)
Calcium: 9.8 mg/dL (ref 8.9–10.3)
Chloride: 106 mmol/L (ref 98–111)
Creatinine, Ser: 0.78 mg/dL (ref 0.44–1.00)
GFR, Estimated: >60 mL/min (ref >60)
Glucose, Bld: 110 mg/dL — ABNORMAL HIGH (ref 70–99)
Potassium: 3.6 mmol/L (ref 3.5–5.1)
Sodium: 140 mmol/L (ref 135–145)

## 2020-04-23 LAB — TROPONIN I (HIGH SENSITIVITY)
Troponin I (High Sensitivity): 5 ng/L (ref <18)
Troponin I (High Sensitivity): 5 ng/L (ref <18)

## 2020-04-23 LAB — MAGNESIUM: Magnesium: 2.3 mg/dL (ref 1.7–2.4)

## 2020-04-23 LAB — TSH: TSH: 3.319 u[IU]/mL (ref 0.350–4.500)

## 2020-04-23 LAB — BRAIN NATRIURETIC PEPTIDE: B Natriuretic Peptide: 103.9 pg/mL — ABNORMAL HIGH (ref 0.0–100.0)

## 2020-04-23 IMAGING — CR DG CHEST 2V
1 series · 2 of 2 positions shown · non-contrast
Comparison: [DATE]

CLINICAL DATA: Chest pain and tightness on the left

EXAM:
CHEST - 2 VIEW

[Series 1: dg chest 2 view · 0.14mm/px · 2 of 2 slices shown]
[im 1/2]
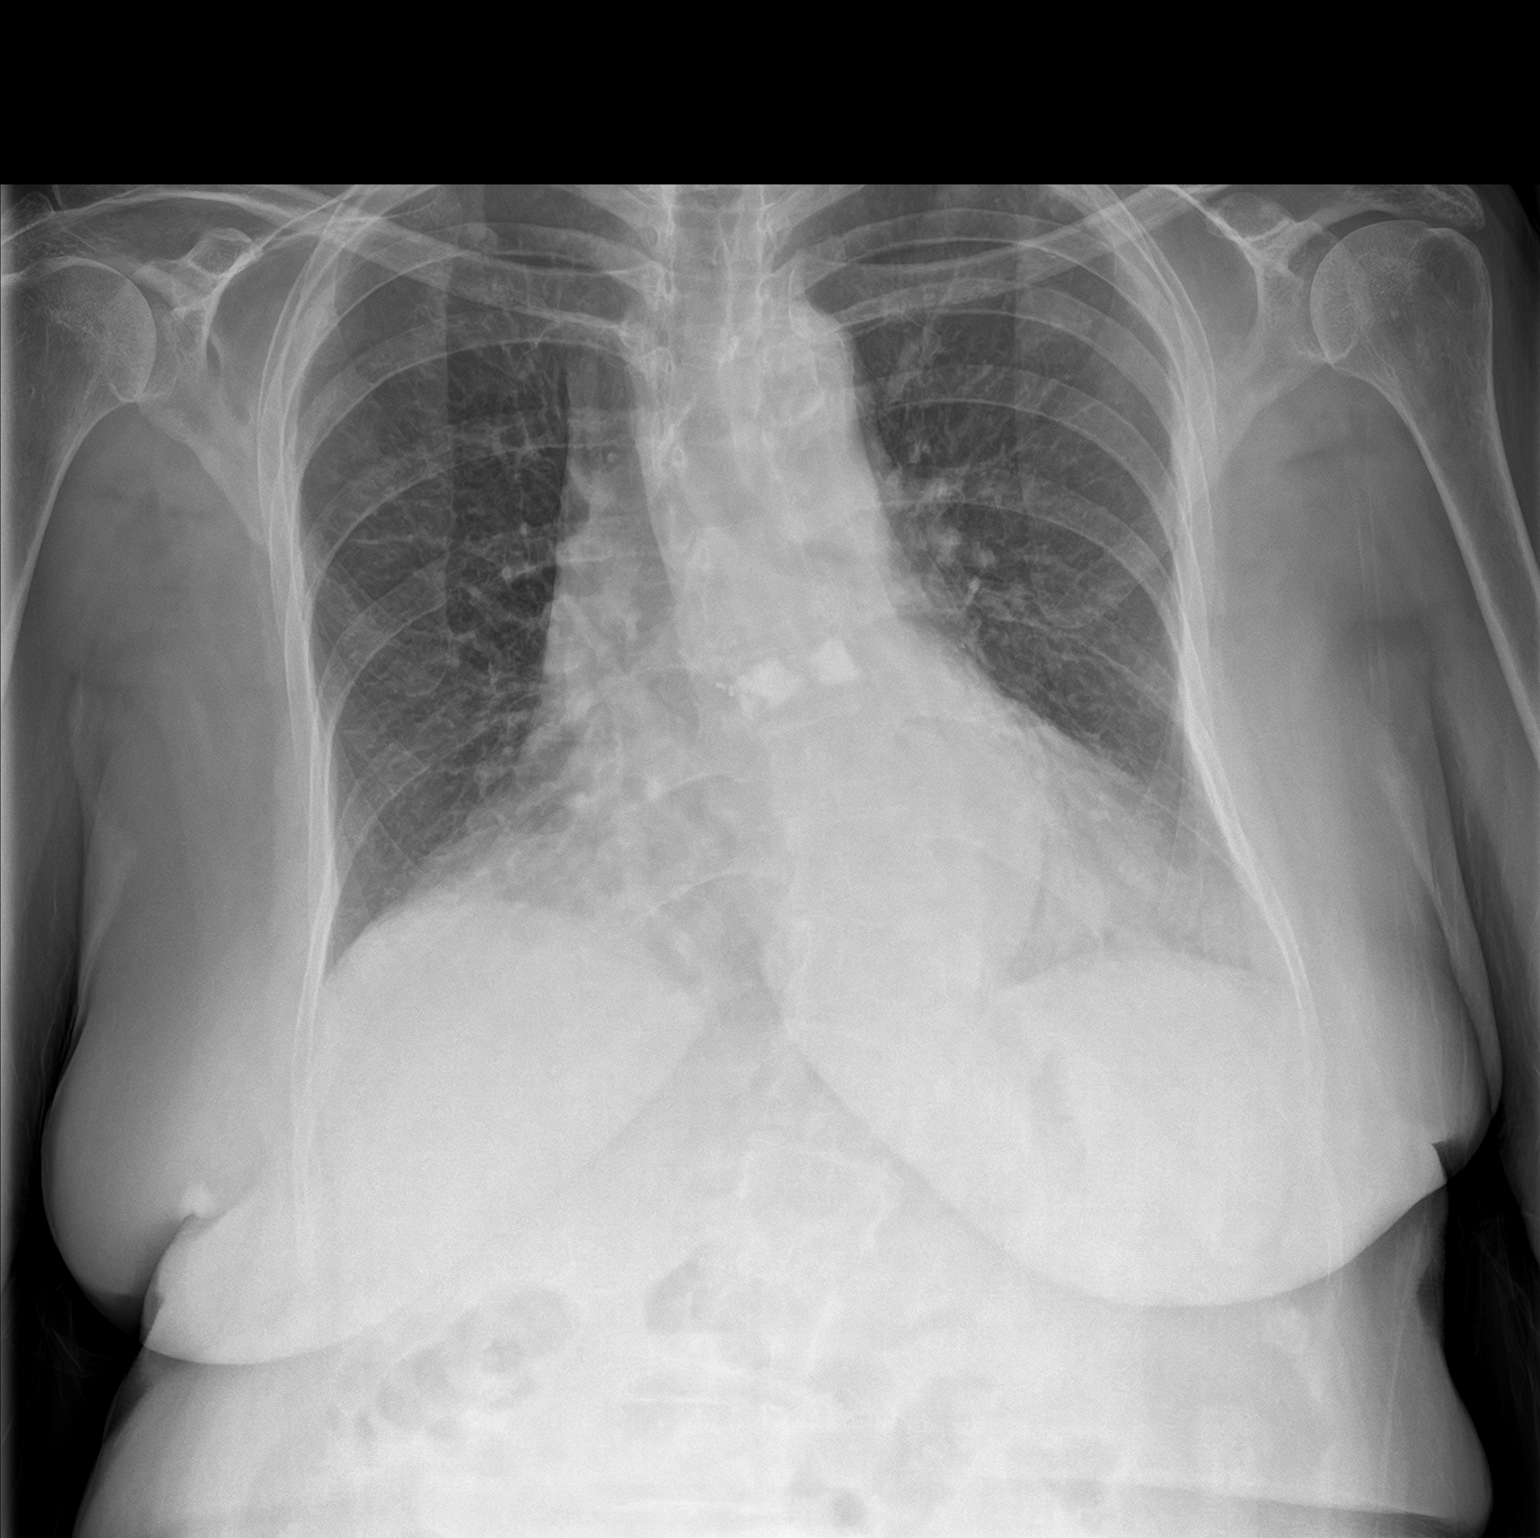
[im 2/2]
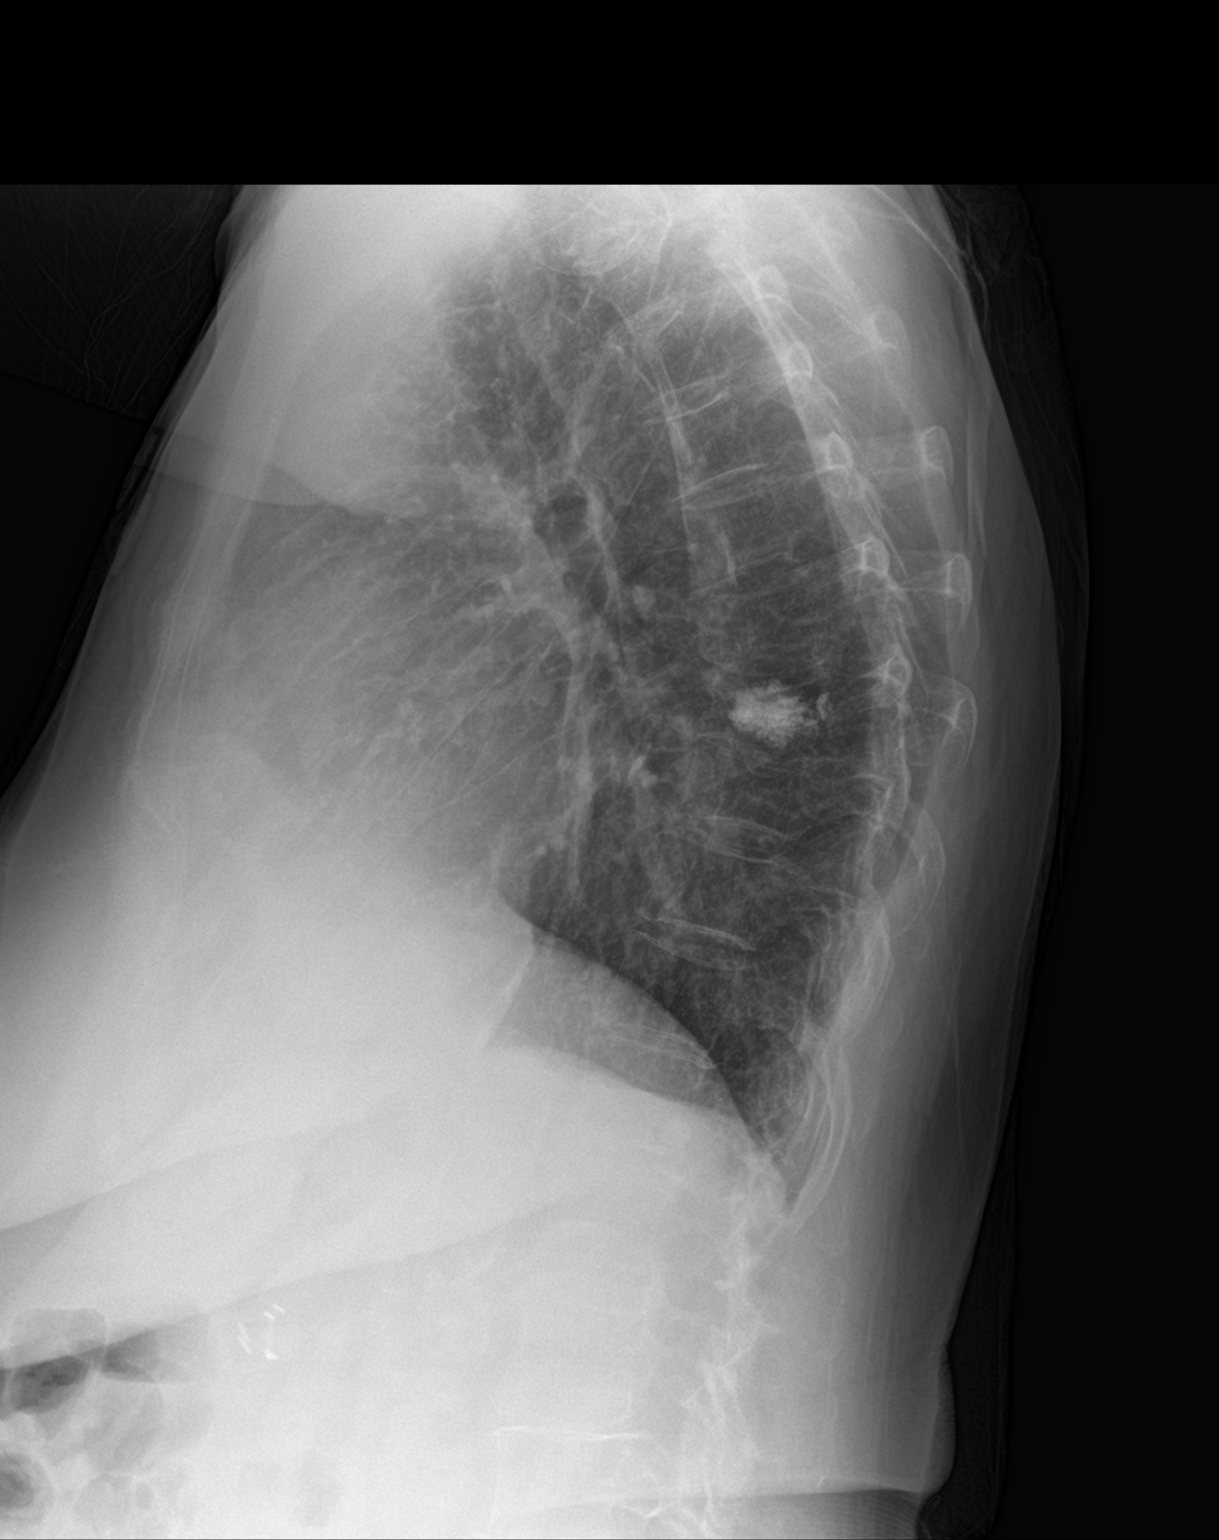

[2 of 2 positions shown; findings below may reference images not displayed]

FINDINGS: Mild cardiomegaly. Aortic atherosclerosis and tortuosity. The lungs
are clear. No infiltrate, collapse or effusion. Chronic spinal
curvature with old thoracic vertebral augmentation.
IMPRESSION: No active disease. Mild cardiomegaly. Aortic atherosclerosis.

## 2020-04-23 NOTE — ED Notes (Signed)
IV attempted x2. Will have another RN try.

## 2020-04-23 NOTE — ED Notes (Signed)
Rainbow was sent to lab. 

## 2020-04-23 NOTE — ED Triage Notes (Signed)
Says she started having chest pain-tightness with her left arm twitching with her heart beat at 730am today.  Says she may  Have been having tias for about 6 weeks.  Has had 5 episodes of passing out.

## 2020-04-23 NOTE — ED Provider Notes (Signed)
[facility] Emergency Department Provider Note  ____________________________________________   First MD Initiated Contact with Patient 04/23/20 1512     (approximate)  I have reviewed the triage vital signs and the nursing notes.   HISTORY  Chief Complaint Chest Pain   HPI Laura Cole is a 79 y.o. female with past medical history of GERD, CAD, asymptomatic bradycardia, HTN, hypothyroidism, TIAs, PVD, skin cancer, osteoarthritis, and diverticulitis who presents for assessment of some chest pain that occurred last night.  Patient states that after going to the bathroom last night when she got back in bed she had some substernal chest pain that lasted approximately 15 minutes.  She states she felt like her left arm was moving with her heart rate but is unable to further clarify.  She denies any tremors or twitching.  Denies any new weakness, she states she had a few episode of some chest discomfort in the lobby while waiting to be seen but otherwise is not currently in any chest pain.  Denies any current headache, earache, sore throat, vomiting, diarrhea, dysuria, rash, abdominal pain, back pain is acute, or other acute sick symptoms.  She endorses a chronic cough which she relates to chronic sinusitis denies any tobacco abuse or no history of COPD or asthma.  She also states that over the last 6 weeks she has passed out several times but has not passed out in the last couple days and does not think she had any chest pain shortness of breath nausea vomiting or other symptoms around passing out episodes.  She had she just moved from [place] to [place] 2 weeks ago currently has appointments to see her PCP and cardiologist later this week.  She is not on any blood thinners.  She is not a smoker and does not use illegal drugs or drink alcohol regularly.         Past Medical History:  Diagnosis Date  . Bradycardia   . Cancer ([place])    skin cancer  . Coronary  artery disease   . Diverticulitis   . GERD (gastroesophageal reflux disease)   . Heart murmur   . Hypertension   . Hypothyroidism   . Osteoarthritis   . Peripheral vascular disease ([place])   . TIA (transient ischemic attack)     There are no problems to display for this patient.   Past Surgical History:  Procedure Laterality Date  . ABDOMINAL HYSTERECTOMY    . BACK SURGERY    . CHOLECYSTECTOMY    . COLON SURGERY    . COLONOSCOPY WITH PROPOFOL N/A 12/05/2019   Procedure: COLONOSCOPY WITH PROPOFOL;  Surgeon: Lesly Rubenstein, MD;  Location: [facility] ENDOSCOPY;  Service: Endoscopy;  Laterality: N/A;  . ESOPHAGOGASTRODUODENOSCOPY (EGD) WITH PROPOFOL N/A 12/05/2019   Procedure: ESOPHAGOGASTRODUODENOSCOPY (EGD) WITH PROPOFOL;  Surgeon: Lesly Rubenstein, MD;  Location: [facility] ENDOSCOPY;  Service: Endoscopy;  Laterality: N/A;  . EYE SURGERY    . RECTOCELE REPAIR    . RECTOCELE REPAIR    . REPLACEMENT TOTAL KNEE Bilateral   . PJKD3 Bilateral   . WRIST SURGERY      Prior to Admission medications   Medication Sig Start Date End Date Taking? Authorizing Provider  amLODipine (NORVASC) 5 MG tablet  10/26/18   [provider]  aspirin 81 MG chewable tablet Chew by mouth. Patient not taking: Reported on 12/05/2019 08/29/15   [provider]  Cholecalciferol (VITAMIN D-1000 MAX ST) 25 MCG (1000 UT) tablet Take by  mouth. Patient not taking: Reported on 12/05/2019    [provider]  clobetasol (TEMOVATE) 0.05 % external solution Mix 5mL into 1# jar of CeraVe and apply daily as directed 12/06/18   [provider]  famotidine (PEPCID) 20 MG tablet Take 20 mg by mouth 2 (two) times daily.    [provider]  levothyroxine (SYNTHROID) 50 MCG tablet Take 50 mcg by mouth daily before breakfast.    [provider]  metoprolol tartrate (LOPRESSOR) 25 MG tablet Take by mouth. 10/26/18 12/05/19  [provider]    Allergies Mushroom extract  complex, Promethazine, Alprazolam, Cefdinir, Ciprofloxacin, Dilaudid [hydromorphone hcl], Hydromorphone, Phenergan [promethazine hcl], and Pravastatin  Family History  Problem Relation Age of Onset  . Breast cancer Sister 41    Social History Social History   Tobacco Use  . Smoking status: Never Smoker  . Smokeless tobacco: Never Used  Vaping Use  . Vaping Use: Never used  Substance Use Topics  . Alcohol use: Not Currently  . Drug use: Never    Review of Systems  Review of Systems  Constitutional: Negative for chills and fever.  HENT: Negative for sore throat.   Eyes: Negative for pain.  Respiratory: Positive for cough ( chronic). Negative for stridor.   Cardiovascular: Positive for chest pain and palpitations.  Gastrointestinal: Negative for abdominal pain and vomiting.  Genitourinary: Negative for dysuria.  Musculoskeletal: Positive for falls. Negative for myalgias.  Skin: Negative for rash.  Neurological: Negative for seizures and headaches.  Psychiatric/Behavioral: Negative for suicidal ideas.  All other systems reviewed and are negative.     ____________________________________________   PHYSICAL EXAM:  VITAL SIGNS: ED Triage Vitals  Enc Vitals Group     BP 04/23/20 1037 (!) 141/60     Pulse Rate 04/23/20 1037 (!) 58     Resp 04/23/20 1037 18     Temp 04/23/20 1037 97.9 F (36.6 C)     Temp Source 04/23/20 1037 Oral     SpO2 04/23/20 1037 100 %     Weight 04/23/20 1029 164 lb (74.4 kg)     Height 04/23/20 1028 5' 6.5" (1.689 m)     Head Circumference --      Peak Flow --      Pain Score 04/23/20 1028 3     Pain Loc --      Pain Edu? --      Excl. in [place]? --    Vitals:   04/23/20 1530 04/23/20 1600  BP: (!) 166/65 (!) 145/65  Pulse: (!) 58 (!) 51  Resp: 16 (!) 24  Temp:    SpO2: 100% 100%   Physical Exam Vitals and nursing note reviewed.  Constitutional:      General: She is not in acute distress.    Appearance: She is well-developed.    HENT:     Head: Normocephalic and atraumatic.     Right Ear: External ear normal.     Left Ear: External ear normal.     Nose: Nose normal.     Mouth/Throat:     Mouth: Mucous membranes are moist.  Eyes:     Conjunctiva/sclera: Conjunctivae normal.  Cardiovascular:     Rate and Rhythm: Normal rate and regular rhythm.     Heart sounds: No murmur heard.   Pulmonary:     Effort: Pulmonary effort is normal. No respiratory distress.     Breath sounds: Normal breath sounds.  Abdominal:     Palpations: Abdomen is  soft.     Tenderness: There is no abdominal tenderness.  Musculoskeletal:     Cervical back: Neck supple.  Skin:    General: Skin is warm and dry.     Capillary Refill: Capillary refill takes less than 2 seconds.  Neurological:     Mental Status: She is alert and oriented to person, place, and time.  Psychiatric:        Mood and Affect: Mood normal.     Cranial nerves II through XII grossly intact.  No pronator drift.  No finger dysmetria.  Symmetric 5/5 strength of all extremities.  Sensation intact to light touch in all extremities.  Unremarkable unassisted gait. ____________________________________________   LABS (all labs ordered are listed, but only abnormal results are displayed)  Labs Reviewed  BASIC METABOLIC PANEL - Abnormal; Notable for the following components:      Result Value   Glucose, Bld 110 (*)    All other components within normal limits  BRAIN NATRIURETIC PEPTIDE - Abnormal; Notable for the following components:   B Natriuretic Peptide 103.9 (*)    All other components within normal limits  CBC  MAGNESIUM  HEPATIC FUNCTION PANEL  TSH  TROPONIN I (HIGH SENSITIVITY)  TROPONIN I (HIGH SENSITIVITY)   ____________________________________________  EKG  Sinus bradycardia with a ventricular rate of 59, incomplete right bundle branch block, normal axis, unremarkable intervals, no evidence of acute ischemia or significant underlying  arrhythmia. ____________________________________________  RADIOLOGY  ED MD interpretation:.  Mild cardiomegaly without lower consolidation, effusion, overt edema, already mediastinum, or other acute thoracic process.  Official radiology report(s): DG Chest 2 View  Result Date: 04/23/2020 CLINICAL DATA:  Chest pain and tightness on the left EXAM: CHEST - 2 VIEW COMPARISON:  04/20/2018 FINDINGS: Mild cardiomegaly. Aortic atherosclerosis and tortuosity. The lungs are clear. No infiltrate, collapse or effusion. Chronic spinal curvature with old thoracic vertebral augmentation. IMPRESSION: No active disease. Mild cardiomegaly. Aortic atherosclerosis. Electronically Signed   By: Nelson Chimes M.D.   On: 04/23/2020 11:41    ____________________________________________   PROCEDURES  Procedure(s) performed (including Critical Care):  .1-3 Lead EKG Interpretation Performed by: Lucrezia Starch, MD Authorized by: Lucrezia Starch, MD     Interpretation: normal     ECG rate assessment: bradycardic     Rhythm: sinus bradycardia     Ectopy: none     Conduction: abnormal       ____________________________________________   INITIAL IMPRESSION / ASSESSMENT AND PLAN / ED COURSE        Patient presents for assessment of some chest pain that occurred last night as described above as well as few minutes of pain occurred while waiting to be seen but resolved prior to my assessment.  On my assessment patient is noted to be bradycardic with a heart rate of 58 with otherwise stable vital signs on room air.  Differential with regard to patient's chest pain includes but is not limited to ACS, arrhythmia, PE, acute anemia, metabolic derangements, pneumonia, pneumothorax, heart failure.  Differential for patient's syncopal episodes over the last several weeks include above as well as possible symptomatic bradycardia versus structural cardiomyopathy.  However patient has not had any syncopal episodes  in a week and currently does not feel lightheaded or dizzy.  Patient has sinus bradycardia on EKG without clear evidence of acute ischemia.  Low suspicion for ACS given troponins x2 are nonelevated over 2 hours.  Chest x-ray shows mild cardiomegaly without edema focal consolidation, pneumothorax or other acute  thoracic process.  Very low suspicion for PE as patient denies any shortness of breath and is not hypoxic and is bradycardic.  In addition her chest pain is described as very intermittent and not pleuritic.  It is not clearly exertional and is certainly possible is caused by her GERD.  BMP shows no significant electrolyte or metabolic derangements.  CBC is unremarkable no evidence of acute anemia.  BMP compliant with exam and chest x-ray is not consistent with acute volume overload.  Uneasiness within normal limits.  Hepatic function panel and TSH are unremarkable.  I did discuss patient's presentation work-up with on-call cardiologist Dr. Ubaldo Glassing who stated patient did not think etiology of patient's chest pain was cardiac given reassuring EKG and to delay troponins but could be seen tomorrow in clinic by her cardiologist.  Explained this to the patient and advised patient to go to this appointment for further evaluation work-up.  Patient voiced understanding agreement this plan.  Given otherwise stable vital signs and reassuring exam and work-up believe this is reasonable.  Discharge stable condition.  Strict return precautions advised and discussed.        ____________________________________________   FINAL CLINICAL IMPRESSION(S) / ED DIAGNOSES  Final diagnoses:  Chest pain, unspecified type    Medications - No data to display   ED Discharge Orders    None       Note:  This document was prepared using Dragon voice recognition software and may include unintentional dictation errors.   Lucrezia Starch, MD 04/23/20 [phone]

## 2020-04-24 DIAGNOSIS — S22009A Unspecified fracture of unspecified thoracic vertebra, initial encounter for closed fracture: Secondary | ICD-10-CM | POA: Insufficient documentation

## 2020-04-24 DIAGNOSIS — J309 Allergic rhinitis, unspecified: Secondary | ICD-10-CM | POA: Insufficient documentation

## 2020-06-12 DIAGNOSIS — I6523 Occlusion and stenosis of bilateral carotid arteries: Secondary | ICD-10-CM | POA: Insufficient documentation

## 2020-06-12 DIAGNOSIS — M48062 Spinal stenosis, lumbar region with neurogenic claudication: Secondary | ICD-10-CM | POA: Insufficient documentation

## 2020-08-06 ENCOUNTER — Other Ambulatory Visit: Payer: Self-pay | Admitting: Internal Medicine

## 2020-08-06 DIAGNOSIS — Z1231 Encounter for screening mammogram for malignant neoplasm of breast: Secondary | ICD-10-CM

## 2020-09-03 DIAGNOSIS — R001 Bradycardia, unspecified: Secondary | ICD-10-CM | POA: Insufficient documentation

## 2020-09-10 ENCOUNTER — Other Ambulatory Visit: Payer: Self-pay

## 2020-09-10 ENCOUNTER — Ambulatory Visit
Admission: RE | Admit: 2020-09-10 | Discharge: 2020-09-10 | Disposition: A | Discharge status: Home / Self Care | Payer: Medicare Other | Source: Ambulatory Visit | Attending: Internal Medicine | Admitting: Internal Medicine

## 2020-09-10 DIAGNOSIS — Z1231 Encounter for screening mammogram for malignant neoplasm of breast: Secondary | ICD-10-CM | POA: Insufficient documentation

## 2020-09-10 IMAGING — MG MM DIGITAL SCREENING BILAT W/ TOMO AND CAD
8 series · 8 of 24 positions shown · non-contrast
Comparison: Previous exam(s).

CLINICAL DATA: Screening.

EXAM:
DIGITAL SCREENING BILATERAL MAMMOGRAM WITH TOMOSYNTHESIS AND CAD
TECHNIQUE: Bilateral screening digital craniocaudal and mediolateral oblique
mammograms were obtained. Bilateral screening digital breast
tomosynthesis was performed. The images were evaluated with
computer-aided detection.

[L CC synth-2D]
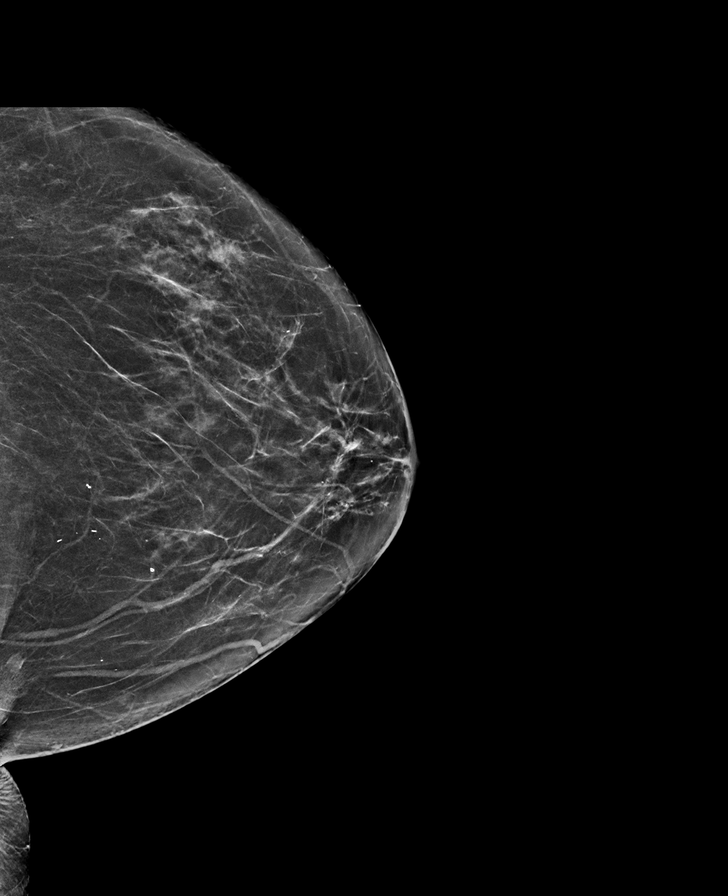

[L MLO synth-2D]
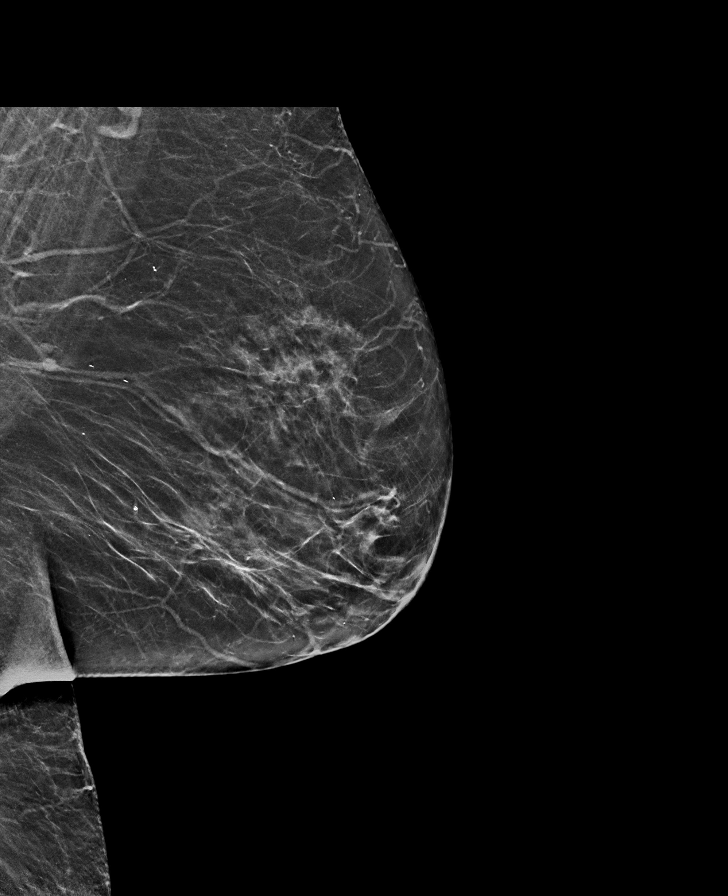

[R CC synth-2D]
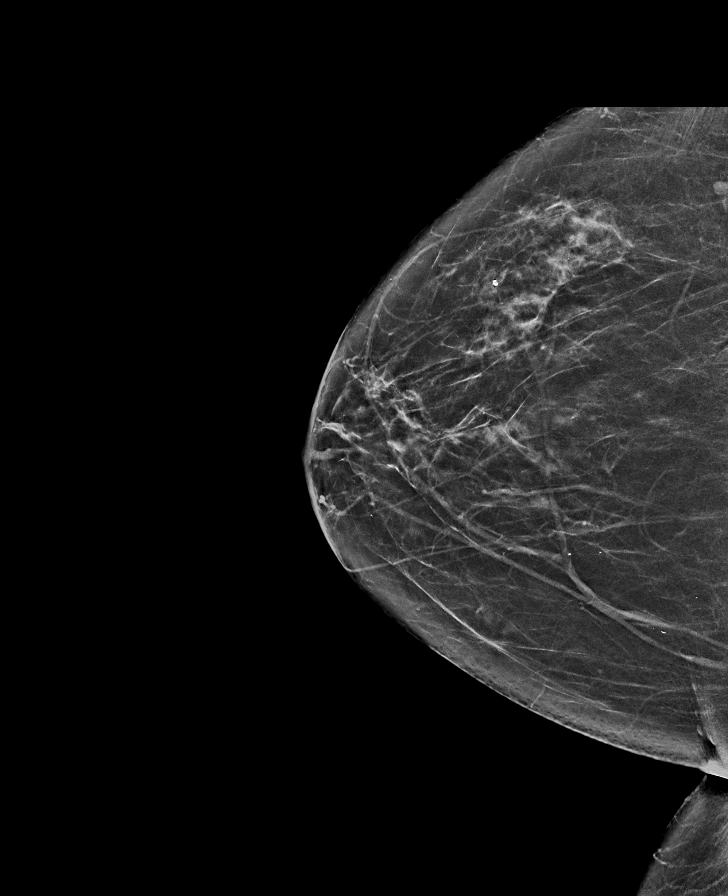

[R MLO synth-2D]
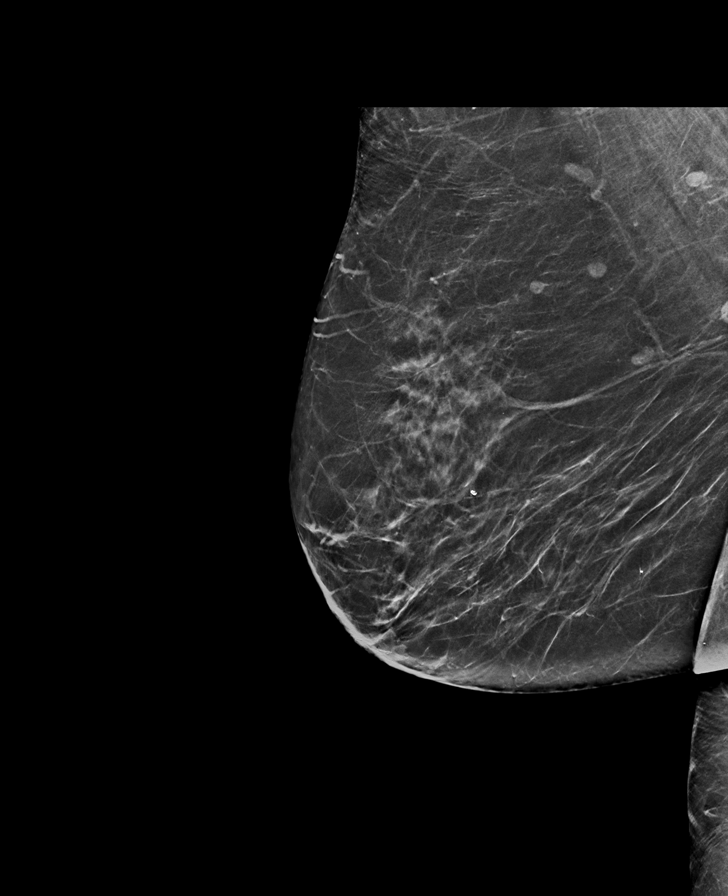

[R MLO tomo · tomo slice 33/66.0]
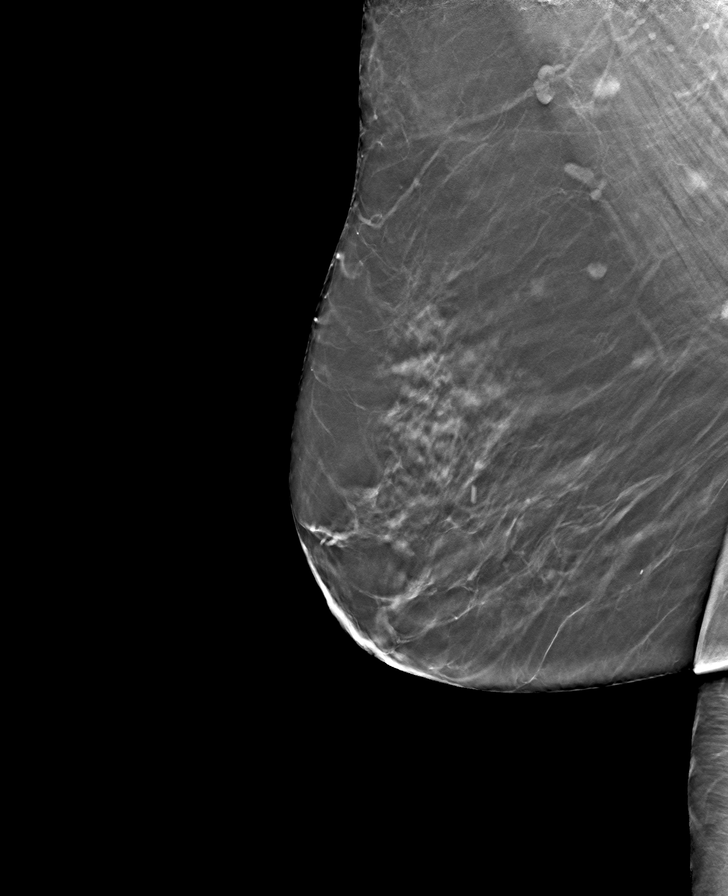

[L MLO tomo · tomo slice 31/62.0]
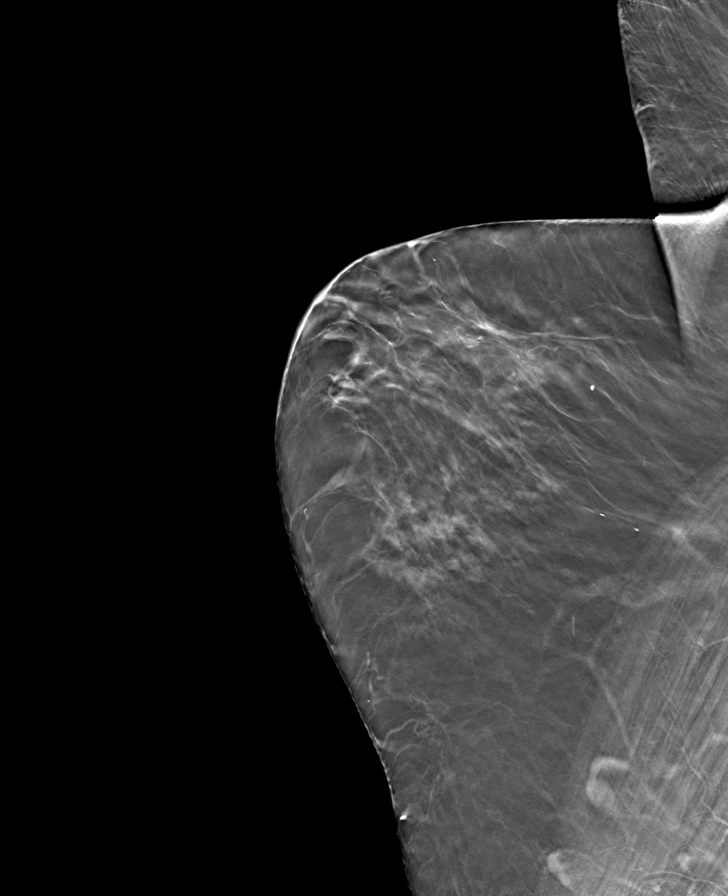

[L CC tomo · tomo slice 30/59.0]
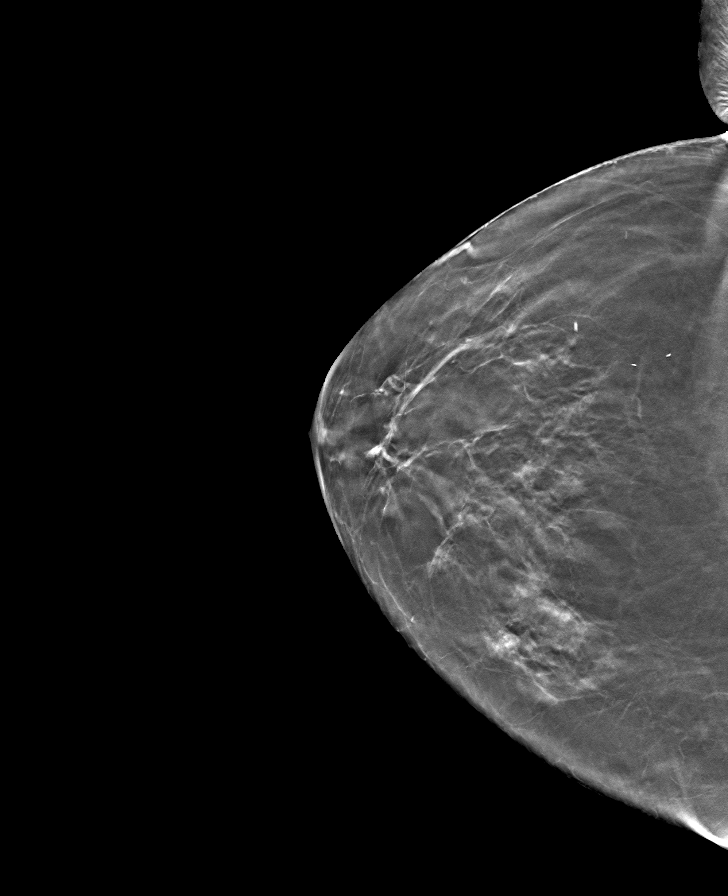

[R CC tomo · tomo slice 29/57.0]
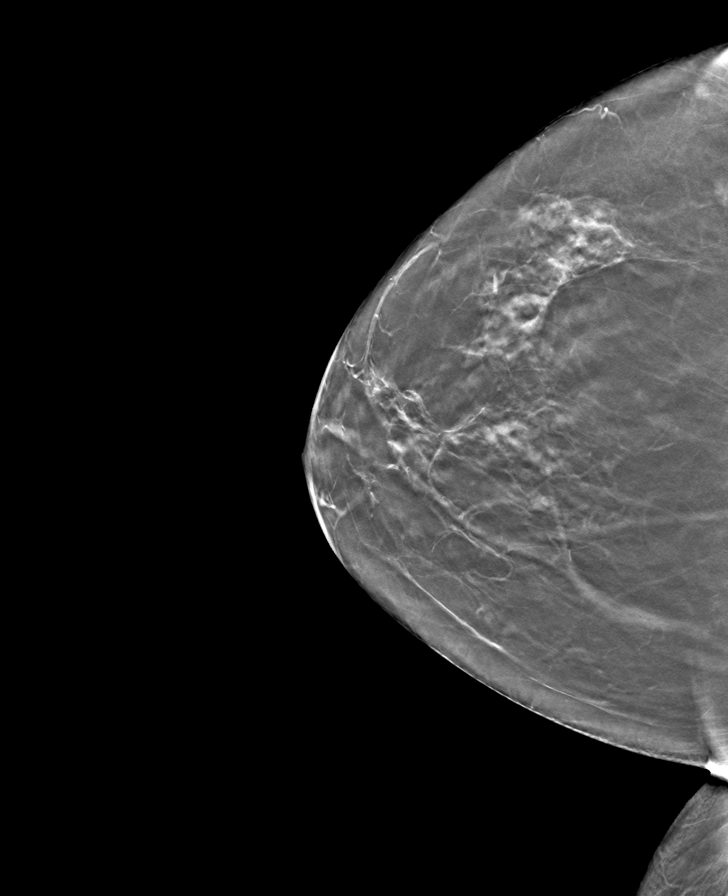

[8 of 24 positions shown; findings below may reference images not displayed]

ACR Breast Density Category b: There are scattered areas of
fibroglandular density.
FINDINGS: There are no findings suspicious for malignancy. The images were
evaluated with computer-aided detection.
IMPRESSION: No mammographic evidence of malignancy. A result letter of this
screening mammogram will be mailed directly to the patient.

RECOMMENDATION:
Screening mammogram in one year. (Code:[OD])

BI-RADS CATEGORY  1: Negative.

## 2020-11-26 ENCOUNTER — Other Ambulatory Visit: Payer: Self-pay | Admitting: Internal Medicine

## 2020-11-26 ENCOUNTER — Other Ambulatory Visit (HOSPITAL_COMMUNITY): Payer: Self-pay | Admitting: Internal Medicine

## 2020-11-26 DIAGNOSIS — E041 Nontoxic single thyroid nodule: Secondary | ICD-10-CM

## 2020-11-26 DIAGNOSIS — E213 Hyperparathyroidism, unspecified: Secondary | ICD-10-CM

## 2020-12-24 ENCOUNTER — Other Ambulatory Visit: Payer: Self-pay

## 2020-12-24 ENCOUNTER — Ambulatory Visit
Admission: RE | Admit: 2020-12-24 | Discharge: 2020-12-24 | Disposition: A | Discharge status: Home / Self Care | Payer: Medicare Other | Source: Ambulatory Visit | Attending: Internal Medicine | Admitting: Internal Medicine

## 2020-12-24 DIAGNOSIS — E041 Nontoxic single thyroid nodule: Secondary | ICD-10-CM | POA: Diagnosis present

## 2020-12-24 DIAGNOSIS — E213 Hyperparathyroidism, unspecified: Secondary | ICD-10-CM | POA: Insufficient documentation

## 2020-12-24 IMAGING — US US THYROID
1 series · 14 of 25 positions shown · non-contrast
Comparison: [DATE] by report only

CLINICAL DATA: Nodule

EXAM:
THYROID ULTRASOUND
TECHNIQUE: Ultrasound examination of the thyroid gland and adjacent soft
tissues was performed.

[Series 1: us thyroid · 0.07mm/px · 51 acquisitions, 14 frames shown]
[im 1/51]
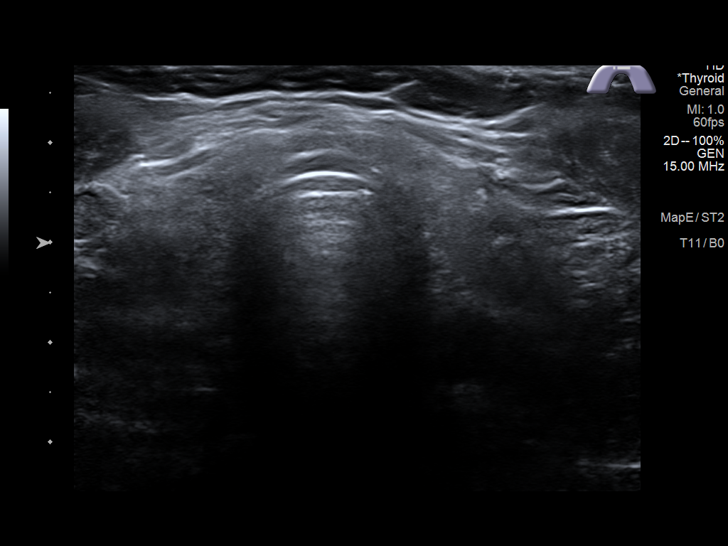
[im 5/51]
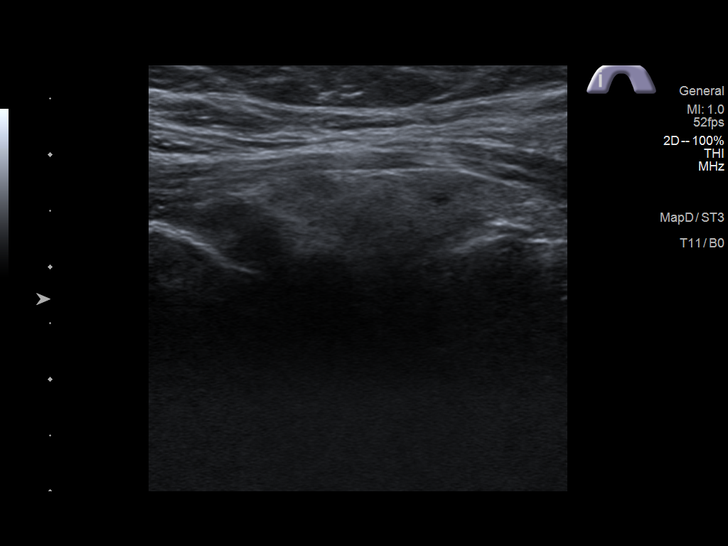
[im 9/51]
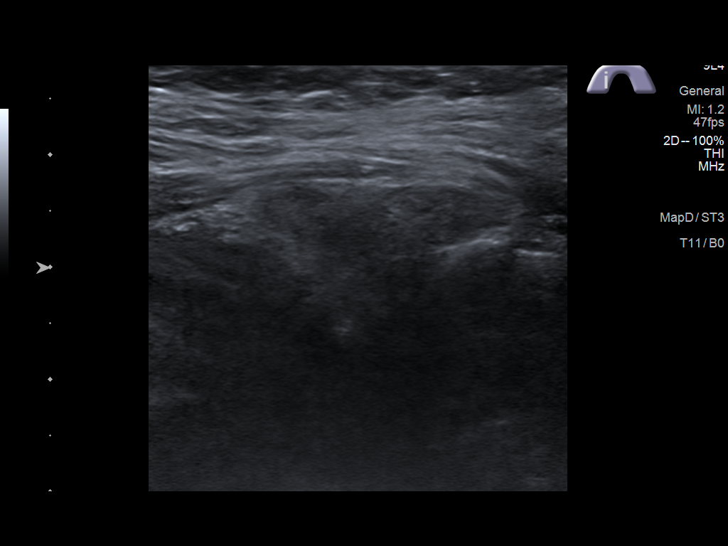
[im 13/51]
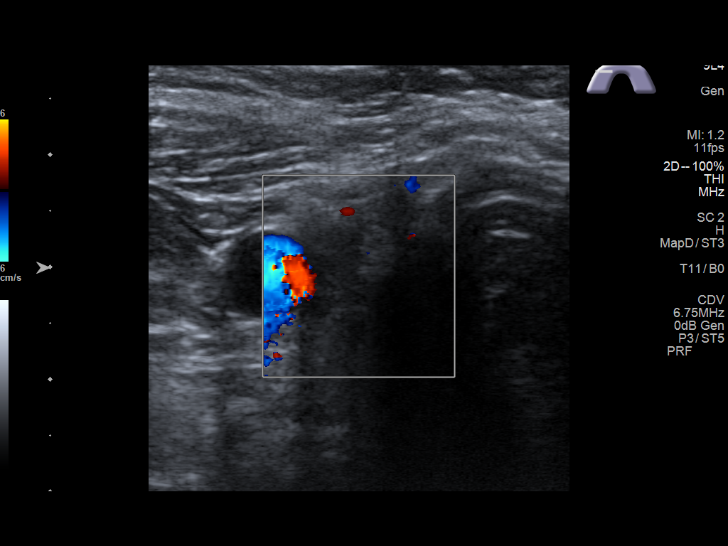
[im 17/51]
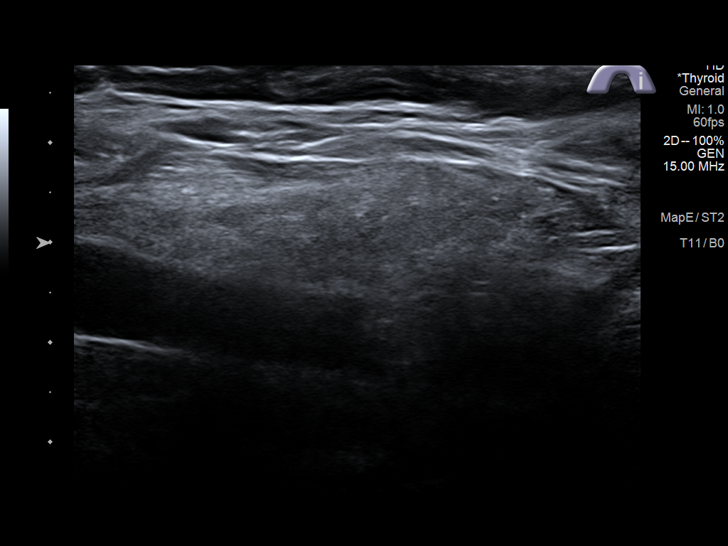
[im 19/51]
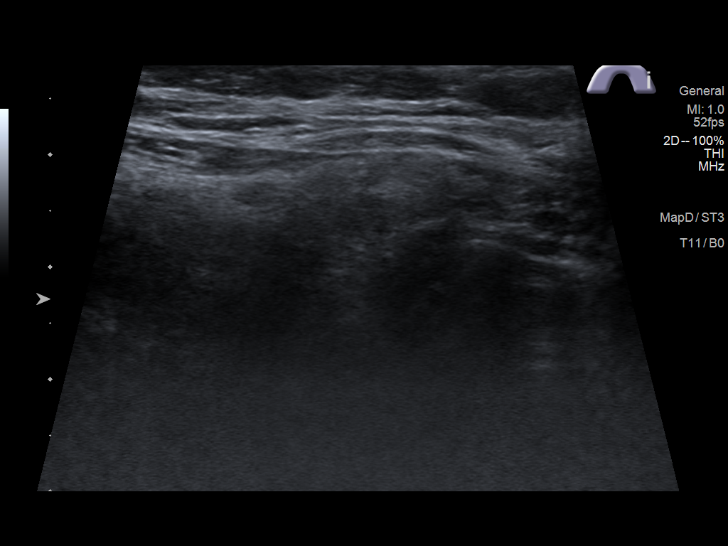
[im 23/51]
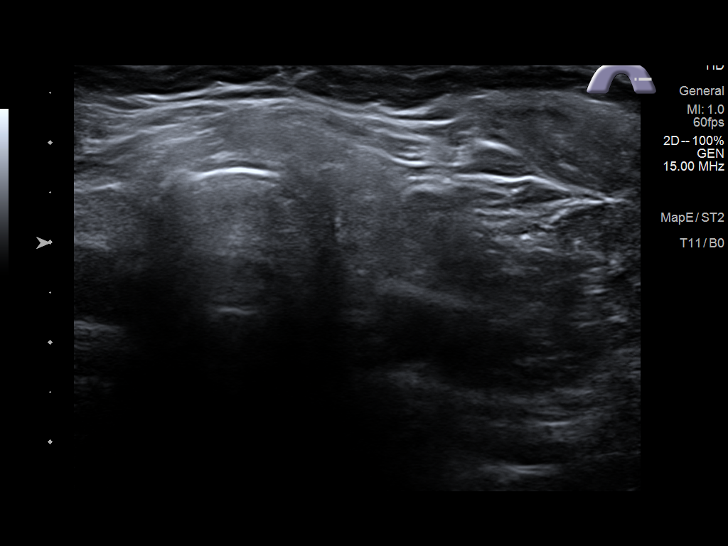
[im 28/51]
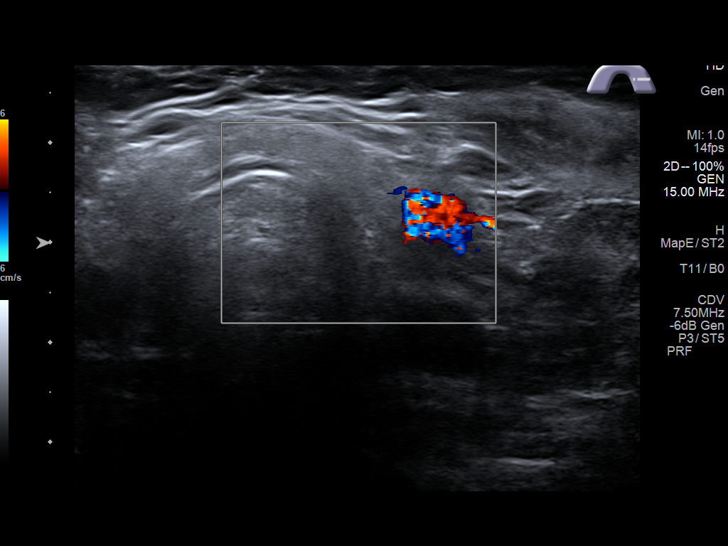
[im 32/51]
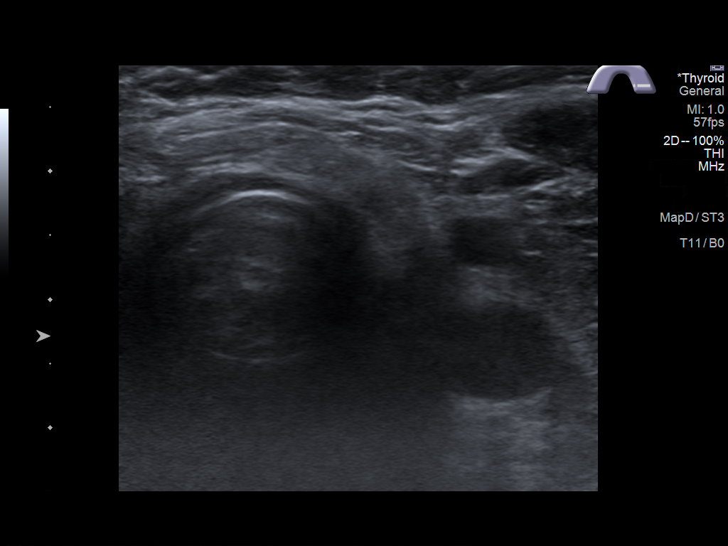
[im 34/51]
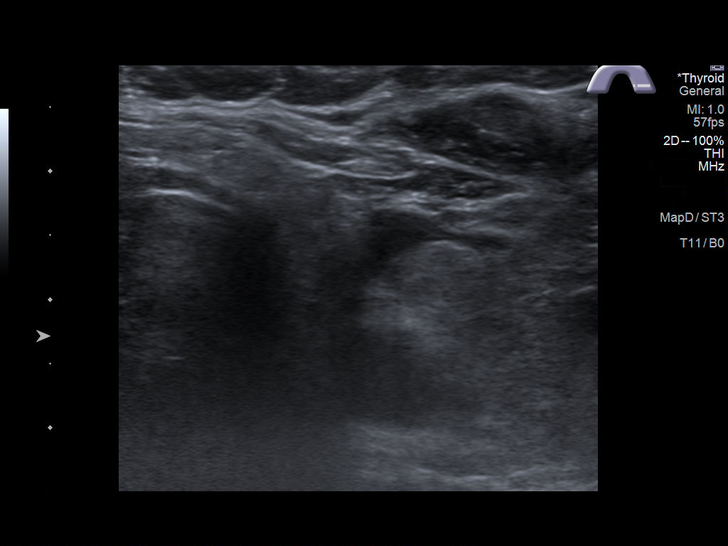
[im 38/51]
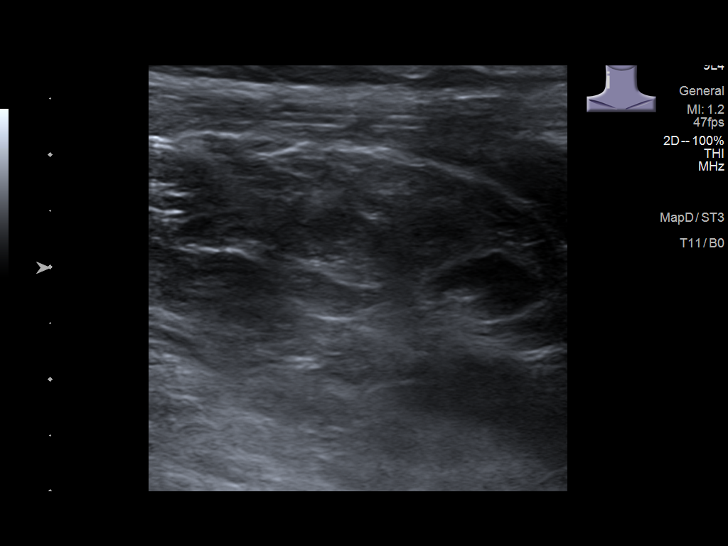
[im 42/51]
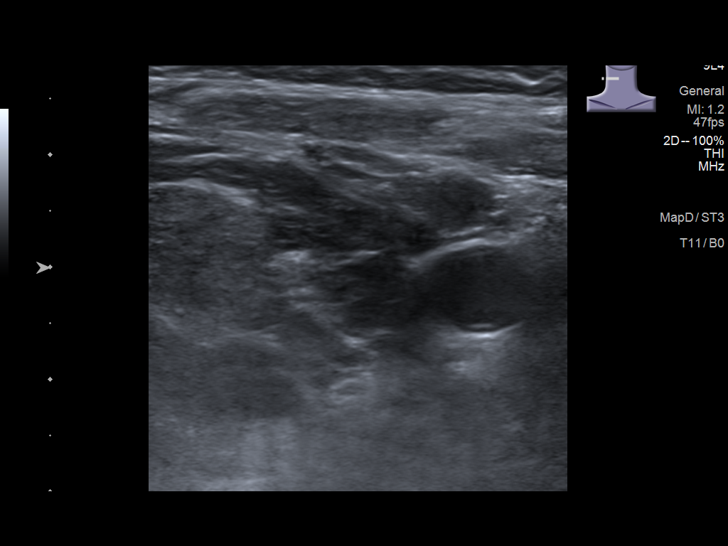
[im 46/51]
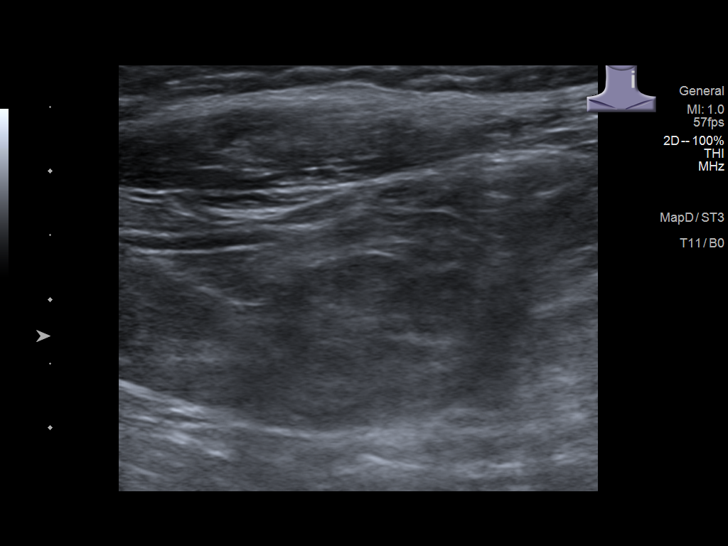
[im 51/51]
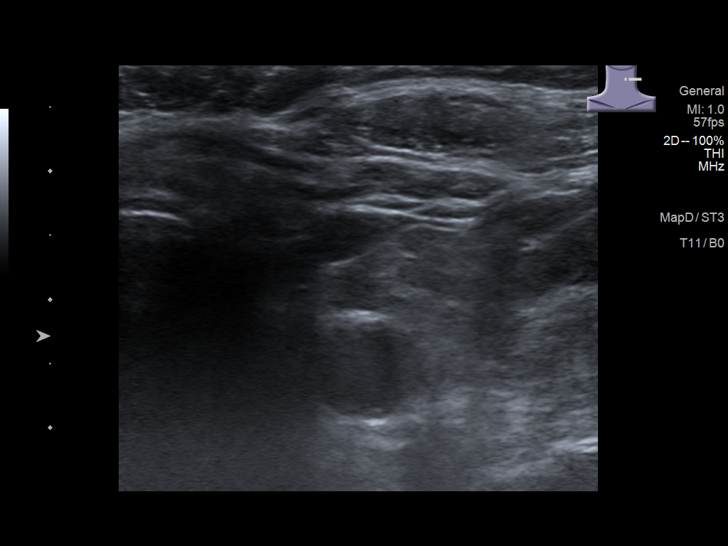

[14 of 25 positions shown; findings below may reference images not displayed]

FINDINGS: Parenchymal Echotexture: Moderately heterogenous

Isthmus: 0.3 cm thickness, stable

Right lobe: 2.6 x 0.9 x 1.6 cm, previously 3.7 x 1.4 x 1.3 by report

Left lobe: 3.2 x 1.2 x 0.8 cm, previously 3.3 x 1 x

_________________________________________________________

Estimated total number of nodules >/= 1 cm: 0

Number of spongiform nodules >/=  2 cm not described below (TR1): 0

Number of mixed cystic and solid nodules >/= 1.5 cm not described
below (TR2): 0

_________________________________________________________

No discrete nodules are seen within the thyroid gland. Specifically,
the left lower pole nodule described on the prior study is not seen.

Subcentimeter right cervical lymph nodes incidentally noted.
IMPRESSION: 1. Small heterogenous thyroid. No nodule or other indication for
biopsy or follow-up.

The above is in keeping with the ACR TI-RADS recommendations - [HOSPITAL] [P3];[DATE].

## 2021-02-18 ENCOUNTER — Ambulatory Visit: Payer: Self-pay | Admitting: Surgery

## 2021-02-18 NOTE — H&P (Signed)
Cc:  Grade II hemorrhoids [K64.1]  HPI:  Pt returns today for Grade II hemorrhoids [K64.1].  Risks/benefits/alternatives discussed again and pt agrees to proceed.  ROS:  AGeneral: Denies weight loss, weight gain, fatigue, fevers, chills, and night sweats. Heart: Denies chest pain, palpitations, racing heart, irregular heartbeat, leg pain or swelling, and decreased activity tolerance. Respiratory: Denies breathing difficulty, shortness of breath, wheezing, cough, and sputum. GI: Denies change in appetite, heartburn, nausea, vomiting, constipation, diarrhea, and blood in stool. GU: Denies difficulty urinating, pain with urinating, urgency, frequency, blood in urine   Objective:    BP 122/59   Pulse 64   Ht 167.6 cm (5\' 6" )   Wt 73.9 kg (163 lb)   BMI 26.31 kg/m   Constitutional :  alert, appears stated age, cooperative and no distress Musculoskeletal: Steady gait and movement rectum: Anoscopy done and tender hemorhroids noted, not amenable to banding due to tenderness Psychiatric: Normal affect, non-agitated, not confused    Assessment:      Grade II hemorrhoids [K64.1] Plan:     Scheduled procedure cancelled due to tenderness noted on exam today.  Will proceed with hemorrhoidectomy.  Discussed risks/benefits/alternatives to surgery.  Alternatives include the options of observation, medical management.  Benefits include symptomatic relief.  I discussed  in detail and the complications related to the operation and the anesthesia, including bleeding, infection, recurrence, remote possibility of temporary or permanent fecal incontinence, poor/delayed wound healing, chronic pain, and additional procedures to address said risks. The risks of general anesthetic, if used, includes MI, CVA, sudden death or even reaction to anesthetic medications also discussed.   We also discussed typical post operative recovery which includes weeks to potentially months of anal pain, drainage, occasional  bleeding, and sense of fecal urgency.    ED return precautions given for sudden increase in pain, bleeding, with possible accompanying fever, nausea, and/or vomiting.  The patient understands the risks, any and all questions were answered to the patient's satisfaction.

## 2021-02-27 ENCOUNTER — Ambulatory Visit: Payer: Medicare Other

## 2021-03-05 ENCOUNTER — Ambulatory Visit: Payer: Medicare Other | Attending: Internal Medicine | Admitting: Physical Therapy

## 2021-03-05 ENCOUNTER — Other Ambulatory Visit: Payer: Self-pay

## 2021-03-05 DIAGNOSIS — R262 Difficulty in walking, not elsewhere classified: Secondary | ICD-10-CM

## 2021-03-05 DIAGNOSIS — R2681 Unsteadiness on feet: Secondary | ICD-10-CM

## 2021-03-05 DIAGNOSIS — M6281 Muscle weakness (generalized): Secondary | ICD-10-CM | POA: Diagnosis present

## 2021-03-05 NOTE — Therapy (Signed)
[place] [facility] SERVICES [address] [place], [place], [address] Phone: [phone]   Fax:  [phone]  Physical Therapy Evaluation  Patient Details  Name: Laura Cole MRN: [id number] Date of Birth: Oct 16, 1940 Referring Provider (PT): Dr. Harrel Lemon   Encounter Date: 03/05/2021   PT End of Session - 03/05/21 1515     Visit Number 1    Number of Visits 17    Date for PT Re-Evaluation 04/30/21    Authorization Type Medicare, start of care 03/05/21    PT Start Time 7782    PT Stop Time 1515    PT Time Calculation (min) 58 min    Equipment Utilized During Treatment Gait belt    Activity Tolerance Patient tolerated treatment well    Behavior During Therapy WFL for tasks assessed/performed             Past Medical History:  Diagnosis Date   Bradycardia    Cancer ([place])    skin cancer   Coronary artery disease    Diverticulitis    GERD (gastroesophageal reflux disease)    Heart murmur    Hypertension    Hypothyroidism    Osteoarthritis    Peripheral vascular disease ([place])    TIA (transient ischemic attack)     Past Surgical History:  Procedure Laterality Date   ABDOMINAL HYSTERECTOMY     BACK SURGERY     CHOLECYSTECTOMY     COLON SURGERY     COLONOSCOPY WITH PROPOFOL N/A 12/05/2019   Procedure: COLONOSCOPY WITH PROPOFOL;  Surgeon: Lesly Rubenstein, MD;  Location: [facility] ENDOSCOPY;  Service: Endoscopy;  Laterality: N/A;   ESOPHAGOGASTRODUODENOSCOPY (EGD) WITH PROPOFOL N/A 12/05/2019   Procedure: ESOPHAGOGASTRODUODENOSCOPY (EGD) WITH PROPOFOL;  Surgeon: Lesly Rubenstein, MD;  Location: [facility] ENDOSCOPY;  Service: Endoscopy;  Laterality: N/A;   EYE SURGERY     RECTOCELE REPAIR     RECTOCELE REPAIR     REPLACEMENT TOTAL KNEE Bilateral    TKRX2 Bilateral    WRIST SURGERY      There were no vitals filed for this visit.    Subjective Assessment - 03/05/21 1417     Subjective "I think not being active and staying  home has made me weak."    Pertinent History 80 yo Female reports increased weakness in BLE. She reports she is able to walk normally on level surface but when she walks outside on unlevel surface, she has to use a cane and her left foot drags. She reports doing well until COVID started and then when COVID Started she stayed home and sat most of  the time. She does believe that inactivity and older age has contributed to weakness. She also reports weakness in her hands and states she will drop things especially if its light. She denies any tingling but has had some skin changes in her hands as a result of repeated antibiotic use which does affect sensitivity in hands. 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 She denies any recent falls.    Limitations Standing;Walking    How long can you sit comfortably? 1 hour, does get up and move around    How long can you stand comfortably? only able to stand flat footed for 5 min, if she is able to move around she does okay;    How long can you walk comfortably? about 750 feet with increased shortness of breath; Pt used to walk around [place] (about 2-3 laps) daily but states 6 months ago she got bronchitis and it lasted a while and she hasn't been able to start back.    Patient Stated Goals "Walk farther than I can currently walk. Be able to do things where I don't hurt. I want to be self sufficient as long as I can."    Currently in Pain? Yes    Pain Score 8     Pain Location Shoulder    Pain Orientation Right;Left    Pain Descriptors / Indicators Aching;Sore;Tightness    Pain Type Chronic pain    Pain Onset More than a month ago    Pain Frequency Intermittent    Aggravating Factors  Bra/undergarments;    Pain Relieving Factors lifting, standing; folding clothes;    Effect of Pain on Daily Activities decreased activity tolerance;    Multiple Pain Sites No                [facility] PT Assessment - 03/05/21  0001       Assessment   Medical Diagnosis Weakness in BLE    Referring Provider (PT) Dr. Harrel Lemon    Onset Date/Surgical Date --   last couple of years (2020)   Hand Dominance Right    Next MD Visit March 2022    Prior Therapy denies any PT for this condition;      Precautions   Precautions Fall    Required Braces or Orthoses --   none     Restrictions   Weight Bearing Restrictions No      Balance Screen   Has the patient fallen in the past 6 months No    Has the patient had a decrease in activity level because of a fear of falling?  No    Is the patient reluctant to leave their home because of a fear of falling?  No      Home Environment   Living Environment Private residence   apartment   [place] to enter   1 step   Entrance Stairs-Number of Steps 1    Home Layout One level    [place] - single point      Prior Function   Level of [place] Retired    Leisure likes to travel;      Associate Professor   Overall Cognitive Status Within Functional Limits for tasks assessed      Observation/Other Assessments   Observations very nice woman    Focus on Therapeutic Outcomes (FOTO)  46%      Observation/Other Assessments-Edema    Edema --   Does have edema in BLE when in dependent position     Sensation   Light Touch Appears Intact      Coordination   Gross Motor Movements are Fluid and Coordinated Yes    Fine Motor Movements are Fluid and Coordinated Yes    Finger Nose Finger Test slight dysmetric on LUE,      Posture/Postural Control   Posture Comments WFL, does have mild slumped posture but is able to self correct      ROM / Strength   AROM / PROM / Strength AROM;Strength      AROM   Overall AROM Comments BUE are WFL; BLE are [facility]      Strength   Overall Strength Comments BUE gross strength: 4-/5;    Strength  Assessment Site Hip;Knee;Ankle    Right/Left Hip  Right;Left    Right Hip Flexion 3/5    Right Hip ABduction 3/5    Right Hip ADduction 3/5    Left Hip Flexion 3/5    Left Hip ABduction 3/5    Left Hip ADduction 3/5    Right/Left Knee Right;Left    Right Knee Extension 4-/5    Left Knee Extension 4-/5    Right/Left Ankle Right;Left      Transfers   Comments able to transfer sit<>Stand without pushing on arm rest of chair but does require increased time/effort      Ambulation/Gait   Gait Comments pt ambulates with reciprocal gait pattern. Does exhibits normal base of support, slightly slower gait speed; occasional foot drag with decreased heel strike      6 Minute Walk- Baseline   6 Minute Walk- Baseline yes    BP (mmHg) 120/54    HR (bpm) 54    02 Sat (%RA) 100 %      6 Minute walk- Post Test   6 Minute Walk Post Test yes    BP (mmHg) 165/45    HR (bpm) 60    02 Sat (%RA) 97 %      6 minute walk test results    Aerobic Endurance Distance Walked 1060    Endurance additional comments without AD, does report shortness of breath      Standardized Balance Assessment   Standardized Balance Assessment Five Times Sit to Stand;10 meter walk test    Five times sit to stand comments  19 sec with arms across chest (>15 sec indicates high risk for falls)                        Objective measurements completed on examination: See above findings.   Will address HEP next session;              PT Education - 03/05/21 1515     Education Details plan of care    Person(s) Educated Patient    Methods Explanation;Verbal cues    Comprehension Verbalized understanding;Returned demonstration;Verbal cues required;Need further instruction              PT Short Term Goals - 03/05/21 1530       PT SHORT TERM GOAL #1   Title Patient will be adherent to HEP at least 3x a week to improve functional strength and balance for better safety at home.    Time 4    Period Weeks    Status New    Target Date 04/02/21       PT SHORT TERM GOAL #2   Title Patient (> 74 years old) will complete five times sit to stand test in < 15 seconds indicating an increased LE strength and improved balance.    Time 4    Period Weeks    Status New    Target Date 04/02/21               PT Long Term Goals - 03/05/21 1531       PT LONG TERM GOAL #1   Title Patient will increase six minute walk test distance to >1300 for progression to community ambulator and improve gait ability closer to age group norms.    Time 8    Period Weeks    Status New    Target Date 04/30/21      PT LONG TERM GOAL #2  Title Patient will increase BLE gross strength to 4+/5 as to improve functional strength for independent gait, increased standing tolerance and increased ADL ability.    Time 8    Period Weeks    Status New    Target Date 04/30/21      PT LONG TERM GOAL #3   Title Patient will tolerate 5 seconds of single leg stance without loss of balance to improve ability to get in and out of shower safely.    Time 8    Period Weeks    Status New    Target Date 04/30/21      PT LONG TERM GOAL #4   Title Patient will improve FOTO score by 5% to indicate improved functional mobility with ADLs.    Time 8    Period Weeks    Status New    Target Date 04/30/21                    Plan - 03/05/21 1437     Clinical Impression Statement 80 yo Female reports progressive LE weakness over last several years after start of COVID. Patient reports being active and walking daily and then after COVID happened, she stayed home and became sedentary. She does have multiple medical conditions which contribute to immobility including, OA with neck/back pain, plantar fasciitis, chronic knee pain with past total knee replacement in each LE; Patient also had Bronchiitis 6 months ago and had a hard time recovering. In addition to LE weakness, she reports increased difficulty holding items in her hands. She has  chronic OA in cervical  spine and could be experiencing radicular symptoms from cervical spine. Patient does exhibit weakness in BUE. She tests as a high risk for falls as evidenced with 5 times sit<>Stand. Patient is able to ambulate short distances without AD requiring supervision for safety. She did ambulate a community ambulator distance but reports increased shortness of breath. Vitals assessed with increased BP noted indicating poor cardiovascular endurance. Patient would benefit from skilled PT intervention to improve UE/LE strength, improve gait ability and overall conditioning. She will require skilled intervention to modify exercise to avoid exacerbating areas of chronic pain including neck, back and knees.    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, HTN (somewhat controlled), GERD, burning in feet (possible neuropathy),PVD,  plantar fasciitis, melanoma removed (10 years ago), hypercholesterolemia, anxiety, TIA (16 years ago)    Examination-Activity Limitations Bend;Lift;Locomotion Level;Squat;Stairs;Stand;Transfers    Examination-Participation Restrictions Cleaning;Community Activity;Shop;Volunteer;Yard Work    Merchant navy officer Stable/Uncomplicated    Designer, jewellery Low    Rehab Potential Good    PT Frequency 2x / week    PT Duration 8 weeks    PT Treatment/Interventions Cryotherapy;Moist Heat;Gait training;Stair training;Functional mobility training;Therapeutic activities;Therapeutic exercise;Balance training;Neuromuscular re-education;Patient/family education;Energy conservation    PT Next Visit Plan further assess balance, initiate HEP for UE/LE strengthening    PT Home Exercise Plan will address next session    Consulted and Agree with Plan of Care Patient             Patient will benefit from skilled therapeutic intervention in order to improve the  following deficits and impairments:  Decreased endurance, Decreased mobility, Difficulty walking, Increased edema, Cardiopulmonary status limiting activity, Decreased activity tolerance, Decreased strength  Visit Diagnosis: Muscle weakness (generalized)  Unsteadiness on feet  Difficulty in walking, not elsewhere classified  Problem List There are no problems to display for this patient.   Laura Cole, PT, DPT 03/05/2021, 3:45 PM  [place] [facility] SERVICES [address] [place], [place], [address] Phone: [phone]   Fax:  [phone]  Name: Laura Cole MRN: [id number] Date of Birth: 1941/03/11

## 2021-03-13 ENCOUNTER — Other Ambulatory Visit: Payer: Self-pay

## 2021-03-13 ENCOUNTER — Ambulatory Visit: Payer: Medicare Other

## 2021-03-13 DIAGNOSIS — R2681 Unsteadiness on feet: Secondary | ICD-10-CM

## 2021-03-13 DIAGNOSIS — M6281 Muscle weakness (generalized): Secondary | ICD-10-CM | POA: Diagnosis not present

## 2021-03-13 DIAGNOSIS — R262 Difficulty in walking, not elsewhere classified: Secondary | ICD-10-CM

## 2021-03-13 NOTE — Therapy (Signed)
[place] MAIN [facility] SERVICES [address] [place], [place], [address] Phone: [phone]   Fax:  [phone]  Physical Therapy Treatment  Patient Details  Name: Laura Cole MRN: [id number] Date of Birth: 1941/04/21 Referring Provider (PT): Dr. Harrel Lemon   Encounter Date: 03/13/2021   PT End of Session - 03/13/21 1559     Visit Number 2    Number of Visits 17    Date for PT Re-Evaluation 04/30/21    Authorization Type Medicare, start of care 03/05/21    PT Start Time 1600    PT Stop Time 1749    PT Time Calculation (min) 44 min    Equipment Utilized During Treatment Gait belt    Activity Tolerance Patient tolerated treatment well    Behavior During Therapy WFL for tasks assessed/performed             Past Medical History:  Diagnosis Date   Bradycardia    Cancer ([place])    skin cancer   Coronary artery disease    Diverticulitis    GERD (gastroesophageal reflux disease)    Heart murmur    Hypertension    Hypothyroidism    Osteoarthritis    Peripheral vascular disease ([place])    TIA (transient ischemic attack)     Past Surgical History:  Procedure Laterality Date   ABDOMINAL HYSTERECTOMY     BACK SURGERY     CHOLECYSTECTOMY     COLON SURGERY     COLONOSCOPY WITH PROPOFOL N/A 12/05/2019   Procedure: COLONOSCOPY WITH PROPOFOL;  Surgeon: Lesly Rubenstein, MD;  Location: [facility] ENDOSCOPY;  Service: Endoscopy;  Laterality: N/A;   ESOPHAGOGASTRODUODENOSCOPY (EGD) WITH PROPOFOL N/A 12/05/2019   Procedure: ESOPHAGOGASTRODUODENOSCOPY (EGD) WITH PROPOFOL;  Surgeon: Lesly Rubenstein, MD;  Location: [facility] ENDOSCOPY;  Service: Endoscopy;  Laterality: N/A;   EYE SURGERY     RECTOCELE REPAIR     RECTOCELE REPAIR     REPLACEMENT TOTAL KNEE Bilateral    TKRX2 Bilateral    WRIST SURGERY      There were no vitals filed for this visit.   Subjective Assessment - 03/13/21 1603     Subjective Patient reports a neck ache due to  packing to move. No falls since last session.    Pertinent History 80 yo Female reports increased weakness in BLE. She reports she is able to walk normally on level surface but when she walks outside on unlevel surface, she has to use a cane and her left foot drags. She reports doing well until COVID started and then when COVID Started she stayed home and sat most of  the time. She does believe that inactivity and older age has contributed to weakness. She also reports weakness in her hands and states she will drop things especially if its light. She denies any tingling but has had some skin changes in her hands as a result of repeated antibiotic use which does affect sensitivity in hands. 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 She denies any recent falls.    Limitations Standing;Walking    How long can you sit comfortably? 1 hour, does get up and move around    How long can you stand comfortably? only able to stand flat footed for 5 min, if she is able to move around she does okay;    How long can you walk comfortably? about 750 feet with increased shortness of breath; Pt [place] (about 2-3 laps) daily but states 6 months ago she got bronchitis and it lasted a while and she hasn't been able to start back.    Patient Stated Goals "Walk farther than I can currently walk. Be able to do things where I don't hurt. I want to be self sufficient as long as I can."    Currently in Pain? Yes    Pain Score 4     Pain Location Neck    Pain Orientation Posterior    Pain Descriptors / Indicators Aching;Sore;Tightness    Pain Type Chronic pain    Pain Onset More than a month ago    Pain Frequency Intermittent                [facility] PT Assessment - 03/13/21 0001       Standardized Balance Assessment   Standardized Balance Assessment Berg Balance Test;Dynamic Gait Index      Berg Balance Test   Sit to Stand Able to stand without using  hands and stabilize independently    Standing Unsupported Able to stand 2 minutes with supervision    Sitting with Back Unsupported but Feet Supported on Floor or Stool Able to sit safely and securely 2 minutes    Stand to Sit Sits safely with minimal use of hands    Transfers Able to transfer safely, minor use of hands    Standing Unsupported with Eyes Closed Able to stand 10 seconds with supervision    Standing Unsupported with Feet Together Able to place feet together independently but unable to hold for 30 seconds    From Standing, Reach Forward with Outstretched Arm Can reach confidently >25 cm (10")    From Standing Position, Pick up Object from Floor Able to pick up shoe, needs supervision    From Standing Position, Turn to Look Behind Over each Shoulder Looks behind from both sides and weight shifts well    Turn 360 Degrees Able to turn 360 degrees safely one side only in 4 seconds or less    Standing Unsupported, Alternately Place Feet on Step/Stool Able to complete 4 steps without aid or supervision    Standing Unsupported, One Foot in Front Able to take small step independently and hold 30 seconds    Standing on One Leg Tries to lift leg/unable to hold 3 seconds but remains standing independently    Total Score 43      Dynamic Gait Index   Level Surface Mild Impairment    Change in Gait Speed Mild Impairment    Gait with Horizontal Head Turns Moderate Impairment    Gait with Vertical Head Turns Mild Impairment    Gait and Pivot Turn Mild Impairment    Step Over Obstacle Mild Impairment    Step Around Obstacles Normal    Steps Mild Impairment    Total Score 16           Patient reports a neck ache due to packing to move.    BERG: 43/56  DGI: 16/24  Standing with CGA next to support surface:  Airex pad: static stand 30 seconds  noticeable trembling of ankles/LE's with fatigue and challenge to maintain stability Airex pad: horizontal head turns 10x each direction;  cueing for arc of motion patient has increased body compensation for turn to left with increased instability and forward trunk lean  Airex pad: vertical head turns 10x each direction cueing for arc of motion, noticeable sway with upward gaze increasing demand  on ankle righting reaction musculature Airex pad: one foot on 6" step one foot on airex pad, hold position for 30 seconds, switch legs, 2x each LE;   Pt educated throughout session about proper posture and technique with exercises. Improved exercise technique, movement at target joints, use of target muscles after min to mod verbal, visual, tactile cues.   HEP performed and tolerated well.  Access Code: [email or web address] URL: [email or web address] Date: 03/13/2021 Prepared by: Janna Arch  Exercises Standing March with Counter Support - 1 x daily - 7 x weekly - 2 sets - 10 reps - 5 hold Standing Tandem Balance with Counter Support - 1 x daily - 7 x weekly - 2 sets - 2 reps - 30 hold Single Leg Stance with Support - 1 x daily - 7 x weekly - 2 sets - 2 reps - 30 hold Mini Squat with Counter Support - 1 x daily - 7 x weekly - 2 sets - 10 reps - 5 hold    Patient is challenged with stairs and will benefit from focused therapeutic interventions to improve patient safety. Single limb stance and tandem stance are additionally challenging for patient to perform at this time. Patient given HEP for stability and strengthening and educated on proper technique. Patient has a side step when turning her head with horizontal  movements. She will require skilled intervention to modify exercise to avoid exacerbating areas of chronic pain including neck, back and knees.              PT Education - 03/13/21 1558     Education Details exercise technique, HEP, balance test    Person(s) Educated Patient    Methods Explanation;Demonstration;Tactile cues;Verbal cues    Comprehension Verbalized understanding;Returned demonstration;Verbal  cues required;Tactile cues required              PT Short Term Goals - 03/05/21 1530       PT SHORT TERM GOAL #1   Title Patient will be adherent to HEP at least 3x a week to improve functional strength and balance for better safety at home.    Time 4    Period Weeks    Status New    Target Date 04/02/21      PT SHORT TERM GOAL #2   Title Patient (> 37 years old) will complete five times sit to stand test in < 15 seconds indicating an increased LE strength and improved balance.    Time 4    Period Weeks    Status New    Target Date 04/02/21               PT Long Term Goals - 03/05/21 1531       PT LONG TERM GOAL #1   Title Patient will increase six minute walk test distance to >1300 for progression to community ambulator and improve gait ability closer to age group norms.    Time 8    Period Weeks    Status New    Target Date 04/30/21      PT LONG TERM GOAL #2   Title Patient will increase BLE gross strength to 4+/5 as to improve functional strength for independent gait, increased standing tolerance and increased ADL ability.    Time 8    Period Weeks    Status New    Target Date 04/30/21      PT LONG TERM GOAL #3   Title Patient will tolerate 5 seconds of single leg stance without loss  of balance to improve ability to get in and out of shower safely.    Time 8    Period Weeks    Status New    Target Date 04/30/21      PT LONG TERM GOAL #4   Title Patient will improve FOTO score by 5% to indicate improved functional mobility with ADLs.    Time 8    Period Weeks    Status New    Target Date 04/30/21                   Plan - 03/13/21 1654     Clinical Impression Statement Patient is challenged with stairs and will benefit from focused therapeutic interventions to improve patient safety. Single limb stance and tandem stance are additionally challenging for patient to perform at this time. Patient given HEP for stability and strengthening and  educated on proper technique. Patient has a side step when turning her head with horizontal  movements. She will require skilled intervention to modify exercise to avoid exacerbating areas of chronic pain including neck, back and knees.    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, HTN (somewhat controlled), GERD, burning in feet (possible neuropathy),PVD,  plantar fasciitis, melanoma removed (10 years ago), hypercholesterolemia, anxiety, TIA (16 years ago)    Examination-Activity Limitations Bend;Lift;Locomotion Level;Squat;Stairs;Stand;Transfers    Examination-Participation Restrictions Cleaning;Community Activity;Shop;Volunteer;Yard Work    Stability/Clinical Decision Making Stable/Uncomplicated    Rehab Potential Good    PT Frequency 2x / week    PT Duration 8 weeks    PT Treatment/Interventions Cryotherapy;Moist Heat;Gait training;Stair training;Functional mobility training;Therapeutic activities;Therapeutic exercise;Balance training;Neuromuscular re-education;Patient/family education;Energy conservation    PT Next Visit Plan further assess balance, initiate HEP for UE/LE strengthening    PT Home Exercise Plan will address next session    Consulted and Agree with Plan of Care Patient             Patient will benefit from skilled therapeutic intervention in order to improve the following deficits and impairments:  Decreased endurance, Decreased mobility, Difficulty walking, Increased edema, Cardiopulmonary status limiting activity, Decreased activity tolerance, Decreased strength  Visit Diagnosis: Muscle weakness (generalized)  Unsteadiness on feet  Difficulty in walking, not elsewhere classified     Problem List There are no problems to display for this patient.   Janna Arch, PT, DPT  03/13/2021, 4:56 PM  [place] MAIN [facility] SERVICES [address] [place], [place], [address] Phone: [phone]   Fax:  [phone]  Name: Corby Villasenor MRN: [id number] Date of Birth: 1940/09/05

## 2021-03-19 ENCOUNTER — Other Ambulatory Visit: Payer: Medicare Other

## 2021-03-20 ENCOUNTER — Other Ambulatory Visit: Payer: Self-pay

## 2021-03-20 ENCOUNTER — Ambulatory Visit: Payer: Medicare Other | Attending: Internal Medicine | Admitting: Physical Therapy

## 2021-03-20 ENCOUNTER — Ambulatory Visit: Payer: Medicare Other | Admitting: Occupational Therapy

## 2021-03-20 VITALS — BP 129/60 | HR 55

## 2021-03-20 DIAGNOSIS — R2689 Other abnormalities of gait and mobility: Secondary | ICD-10-CM | POA: Insufficient documentation

## 2021-03-20 DIAGNOSIS — R269 Unspecified abnormalities of gait and mobility: Secondary | ICD-10-CM | POA: Diagnosis present

## 2021-03-20 DIAGNOSIS — M6281 Muscle weakness (generalized): Secondary | ICD-10-CM | POA: Insufficient documentation

## 2021-03-20 DIAGNOSIS — R262 Difficulty in walking, not elsewhere classified: Secondary | ICD-10-CM | POA: Diagnosis present

## 2021-03-20 DIAGNOSIS — R2681 Unsteadiness on feet: Secondary | ICD-10-CM | POA: Diagnosis present

## 2021-03-20 DIAGNOSIS — R278 Other lack of coordination: Secondary | ICD-10-CM | POA: Diagnosis present

## 2021-03-20 NOTE — Therapy (Signed)
[place] MAIN [facility] SERVICES [address] [place], [place], [address] Phone: [phone]   Fax:  [phone]  Physical Therapy Treatment  Patient Details  Name: Laura Cole MRN: [id number] Date of Birth: 11/12/1940 Referring Provider (PT): Dr. Harrel Lemon   Encounter Date: 03/20/2021   PT End of Session - 03/20/21 1027     Visit Number 3    Number of Visits 17    Date for PT Re-Evaluation 04/30/21    Authorization Type Medicare, start of care 03/05/21    PT Start Time 1016    PT Stop Time 1100    PT Time Calculation (min) 44 min    Equipment Utilized During Treatment Gait belt    Activity Tolerance Patient tolerated treatment well    Behavior During Therapy WFL for tasks assessed/performed             Past Medical History:  Diagnosis Date   Bradycardia    Cancer ([place])    skin cancer   Coronary artery disease    Diverticulitis    GERD (gastroesophageal reflux disease)    Heart murmur    Hypertension    Hypothyroidism    Osteoarthritis    Peripheral vascular disease ([place])    TIA (transient ischemic attack)     Past Surgical History:  Procedure Laterality Date   ABDOMINAL HYSTERECTOMY     BACK SURGERY     CHOLECYSTECTOMY     COLON SURGERY     COLONOSCOPY WITH PROPOFOL N/A 12/05/2019   Procedure: COLONOSCOPY WITH PROPOFOL;  Surgeon: Lesly Rubenstein, MD;  Location: [facility] ENDOSCOPY;  Service: Endoscopy;  Laterality: N/A;   ESOPHAGOGASTRODUODENOSCOPY (EGD) WITH PROPOFOL N/A 12/05/2019   Procedure: ESOPHAGOGASTRODUODENOSCOPY (EGD) WITH PROPOFOL;  Surgeon: Lesly Rubenstein, MD;  Location: [facility] ENDOSCOPY;  Service: Endoscopy;  Laterality: N/A;   EYE SURGERY     RECTOCELE REPAIR     RECTOCELE REPAIR     REPLACEMENT TOTAL KNEE Bilateral    TKRX2 Bilateral    WRIST SURGERY      Vitals:   03/20/21 1023  BP: 129/60  Pulse: (!) 55     Subjective Assessment - 03/20/21 1022     Subjective Patient reports doing well.  She reports forgetting to take her BP meds this morning and just took them. Would like for [place] to assess BP; Reports feeling like her ankles are contributing to imbalance and limited walking; She reports soreness in BLE, she is unsure if its related to crestor or the exercise. She is following up with PCP tomorrow regarding the cholesterol medication;    Pertinent History 80 yo Female reports increased weakness in BLE. She reports she is able to walk normally on level surface but when she walks outside on unlevel surface, she has to use a cane and her left foot drags. She reports doing well until COVID started and then when COVID Started she stayed home and sat most of  the time. She does believe that inactivity and older age has contributed to weakness. She also reports weakness in her hands and states she will drop things especially if its light. She denies any tingling but has had some skin changes in her hands as a result of repeated antibiotic use which does affect sensitivity in hands. 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 She denies any recent falls.    Limitations Standing;Walking    How long can you sit comfortably? 1 hour, does  get up and move around    How long can you stand comfortably? only able to stand flat footed for 5 min, if she is able to move around she does okay;    How long can you walk comfortably? about 750 feet with increased shortness of breath; Pt used to walk around [employer] (about 2-3 laps) daily but states 6 months ago she got bronchitis and it lasted a while and she hasn't been able to start back.    Patient Stated Goals "Walk farther than I can currently walk. Be able to do things where I don't hurt. I want to be self sufficient as long as I can."    Currently in Pain? Yes    Pain Score 3     Pain Location Leg    Pain Orientation Lower;Posterior    Pain Descriptors / Indicators Aching;Sore;Tightness    Pain Type Chronic pain     Pain Onset More than a month ago    Pain Frequency Intermittent    Aggravating Factors  worse with standing/walking    Pain Relieving Factors rest/stretch    Effect of Pain on Daily Activities decreased activity tolerance;    Multiple Pain Sites No              TREATMENT: Warm up on Nustep BUE/BLE level 2 x4 min (Unbilled), seat #8  Instructed patient in LE strengthening exercise: Standing with 2.5# ankle weight: -hip flexion march x10 reps each LE -hip abduction x10 reps each LE with cues to avoid hip ER to isolate hip abductor strengthening -hip extension x10 reps each LE with cues to avoid leaning forward to isolate hip extensor strengthening;  -BLE heel raises x15 reps Patient required rail assist with all exercise for safety.   Instructed patient in balance exercise:  Standing with CGA next to support surface:  Airex pad: static stand 30 seconds  eyes open, 10 sec eyes closed, moderate instability with eyes closed, required CGA to close supervision and cues for neutral weight shift;  -modified tandem stance:  Unsupported hold 10 sec hold  Progressed to lateral head turns side/side x5 reps each with CGA for safety;  Patient does exhibit increased instability with right foot ahead of left foot;  She also exhibits increased loss of balance to right side requiring intermittent rail assist;   Standing on 1/2 bolster (round side up):  -feet apart: heel/toe rock x10 reps;  -feet apart: unsupported standing 10 sec hold, progressed to lateral head turns side/side x5 reps  Tandem stance with 1-0 rail assist 10 sec hold x2 reps each foot in front, more unsteady with right foot ahead of left foot;    BP at end of session 113/46, patient reports she just took BP meds which is why its lower.   Pt educated throughout session about proper posture and technique with exercises. Improved exercise technique, movement at target joints, use of target muscles after min to mod verbal,  visual, tactile cues.  Patient required min VCs for balance stability, including to increase trunk control for less loss of balance with smaller base of support                         PT Education - 03/20/21 1026     Education Details LE strengthening, positioning, balance,    Person(s) Educated Patient    Methods Explanation;Verbal cues    Comprehension Returned demonstration;Verbal cues required;Verbalized understanding;Need further instruction  PT Short Term Goals - 03/05/21 1530       PT SHORT TERM GOAL #1   Title Patient will be adherent to HEP at least 3x a week to improve functional strength and balance for better safety at home.    Time 4    Period Weeks    Status New    Target Date 04/02/21      PT SHORT TERM GOAL #2   Title Patient (> 89 years old) will complete five times sit to stand test in < 15 seconds indicating an increased LE strength and improved balance.    Time 4    Period Weeks    Status New    Target Date 04/02/21               PT Long Term Goals - 03/05/21 1531       PT LONG TERM GOAL #1   Title Patient will increase six minute walk test distance to >1300 for progression to community ambulator and improve gait ability closer to age group norms.    Time 8    Period Weeks    Status New    Target Date 04/30/21      PT LONG TERM GOAL #2   Title Patient will increase BLE gross strength to 4+/5 as to improve functional strength for independent gait, increased standing tolerance and increased ADL ability.    Time 8    Period Weeks    Status New    Target Date 04/30/21      PT LONG TERM GOAL #3   Title Patient will tolerate 5 seconds of single leg stance without loss of balance to improve ability to get in and out of shower safely.    Time 8    Period Weeks    Status New    Target Date 04/30/21      PT LONG TERM GOAL #4   Title Patient will improve FOTO score by 5% to indicate improved functional  mobility with ADLs.    Time 8    Period Weeks    Status New    Target Date 04/30/21                   Plan - 03/20/21 1056     Clinical Impression Statement Patient motivated and participated well within session. Instructed patient in advanced LE strengthening, utilizing ankle weights for resistance. She does require intermittent rail assist for safety and min VCS for proper positioning. Progressed balance exercise, utilizing compliant surface to challenge stance control. Patient does exhibit decreased ankle strategies requiring CGA to min A for safety when unsupported. She also exhibits increased instability with right foot ahead of left foot with tendency to fall to right side. Patient does report left leg is longer than right leg which could be contributing to this. Would benefit from leg length assessment next session. She would benefit from additional skilled PT intervention to improve strength, balance and mobility;    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, HTN (somewhat controlled), GERD, burning in feet (possible neuropathy),PVD,  plantar fasciitis, melanoma removed (10 years ago), hypercholesterolemia, anxiety, TIA (16 years ago)    Examination-Activity Limitations Bend;Lift;Locomotion Level;Squat;Stairs;Stand;Transfers    Examination-Participation Restrictions Cleaning;Community Activity;Shop;Volunteer;Yard Work    Stability/Clinical Decision Making Stable/Uncomplicated    Rehab Potential Good    PT Frequency 2x / week    PT Duration 8  weeks    PT Treatment/Interventions Cryotherapy;Moist Heat;Gait training;Stair training;Functional mobility training;Therapeutic activities;Therapeutic exercise;Balance training;Neuromuscular re-education;Patient/family education;Energy conservation    PT Next Visit Plan further assess balance, initiate HEP for UE/LE  strengthening    PT Home Exercise Plan will address next session    Consulted and Agree with Plan of Care Patient             Patient will benefit from skilled therapeutic intervention in order to improve the following deficits and impairments:  Decreased endurance, Decreased mobility, Difficulty walking, Increased edema, Cardiopulmonary status limiting activity, Decreased activity tolerance, Decreased strength  Visit Diagnosis: Muscle weakness (generalized)  Unsteadiness on feet  Difficulty in walking, not elsewhere classified     Problem List There are no problems to display for this patient.   Arshia Spellman, PT, DPT 03/20/2021, 11:03 AM  [place] MAIN [facility] SERVICES [address] [place], [place], [address] Phone: [phone]   Fax:  [phone]  Name: Laura Cole MRN: [id number] Date of Birth: [birth year]

## 2021-03-26 ENCOUNTER — Encounter: Payer: Medicare Other | Admitting: Occupational Therapy

## 2021-03-26 ENCOUNTER — Ambulatory Visit: Payer: Medicare Other | Admitting: Physical Therapy

## 2021-03-28 ENCOUNTER — Encounter: Payer: Medicare Other | Admitting: Occupational Therapy

## 2021-03-28 ENCOUNTER — Ambulatory Visit: Payer: Medicare Other | Admitting: Physical Therapy

## 2021-03-28 ENCOUNTER — Ambulatory Visit: Admit: 2021-03-28 | Payer: Medicare Other | Admitting: Surgery

## 2021-03-28 ENCOUNTER — Other Ambulatory Visit: Payer: Self-pay

## 2021-03-28 ENCOUNTER — Encounter: Payer: Self-pay | Admitting: Physical Therapy

## 2021-03-28 DIAGNOSIS — M6281 Muscle weakness (generalized): Secondary | ICD-10-CM | POA: Diagnosis not present

## 2021-03-28 DIAGNOSIS — R269 Unspecified abnormalities of gait and mobility: Secondary | ICD-10-CM

## 2021-03-28 DIAGNOSIS — R278 Other lack of coordination: Secondary | ICD-10-CM

## 2021-03-28 DIAGNOSIS — R2681 Unsteadiness on feet: Secondary | ICD-10-CM

## 2021-03-28 DIAGNOSIS — R262 Difficulty in walking, not elsewhere classified: Secondary | ICD-10-CM

## 2021-03-28 DIAGNOSIS — R2689 Other abnormalities of gait and mobility: Secondary | ICD-10-CM

## 2021-03-28 SURGERY — EXAM UNDER ANESTHESIA WITH HEMORRHOIDECTOMY
Anesthesia: General | Site: Rectum

## 2021-03-28 NOTE — Therapy (Signed)
[place] MAIN [facility] SERVICES [address] [place], [place], [address] Phone: [phone]   Fax:  [phone]  Physical Therapy Treatment  Patient Details  Name: Laura Cole MRN: [id number] Date of Birth: 1941-01-28 Referring Provider (PT): Dr. Harrel Lemon   Encounter Date: 03/28/2021   PT End of Session - 03/28/21 1604     Visit Number 4    Number of Visits 17    Date for PT Re-Evaluation 04/30/21    Authorization Type Medicare, start of care 03/05/21    PT Start Time 3299    PT Stop Time 1346    PT Time Calculation (min) 44 min    Equipment Utilized During Treatment Gait belt    Activity Tolerance Patient tolerated treatment well    Behavior During Therapy WFL for tasks assessed/performed             Past Medical History:  Diagnosis Date   Bradycardia    Cancer ([place])    skin cancer   Coronary artery disease    Diverticulitis    GERD (gastroesophageal reflux disease)    Heart murmur    Hypertension    Hypothyroidism    Osteoarthritis    Peripheral vascular disease ([place])    TIA (transient ischemic attack)     Past Surgical History:  Procedure Laterality Date   ABDOMINAL HYSTERECTOMY     BACK SURGERY     CHOLECYSTECTOMY     COLON SURGERY     COLONOSCOPY WITH PROPOFOL N/A 12/05/2019   Procedure: COLONOSCOPY WITH PROPOFOL;  Surgeon: Lesly Rubenstein, MD;  Location: [facility] ENDOSCOPY;  Service: Endoscopy;  Laterality: N/A;   ESOPHAGOGASTRODUODENOSCOPY (EGD) WITH PROPOFOL N/A 12/05/2019   Procedure: ESOPHAGOGASTRODUODENOSCOPY (EGD) WITH PROPOFOL;  Surgeon: Lesly Rubenstein, MD;  Location: [facility] ENDOSCOPY;  Service: Endoscopy;  Laterality: N/A;   EYE SURGERY     RECTOCELE REPAIR     RECTOCELE REPAIR     REPLACEMENT TOTAL KNEE Bilateral    TKRX2 Bilateral    WRIST SURGERY      There were no vitals filed for this visit.   Subjective Assessment - 03/28/21 1307     Subjective Pt states she staggered more than she  typically does for 2 days after last session - pt believes staggering came from balance exercises. She states she did not perform her HEP for 2 days however has since returned. Endorses pain in both cervical spine and B knees. Pt unsure if pain is coming from exercises or Crestor.    Pertinent History 80 yo Female reports increased weakness in BLE. She reports she is able to walk normally on level surface but when she walks outside on unlevel surface, she has to use a cane and her left foot drags. She reports doing well until COVID started and then when COVID Started she stayed home and sat most of  the time. She does believe that inactivity and older age has contributed to weakness. She also reports weakness in her hands and states she will drop things especially if its light. She denies any tingling but has had some skin changes in her hands as a result of repeated antibiotic use which does affect sensitivity in hands. 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 She denies any recent falls.    Limitations Standing;Walking    How long can you sit comfortably? 1 hour, does get up and move around    How long can you stand  comfortably? only able to stand flat footed for 5 min, if she is able to move around she does okay;    How long can you walk comfortably? about 750 feet with increased shortness of breath; Pt used to walk around [employer] (about 2-3 laps) daily but states 6 months ago she got bronchitis and it lasted a while and she hasn't been able to start back.    Patient Stated Goals "Walk farther than I can currently walk. Be able to do things where I don't hurt. I want to be self sufficient as long as I can."    Currently in Pain? Yes    Pain Score 2     Pain Location Knee    Pain Orientation Right;Left    Pain Descriptors / Indicators Aching    Pain Type Chronic pain    Pain Onset More than a month ago              TREATMENT: Warm up on Octane  BUE/BLE level 4, x5 min    Seated hamstring stretch, 2 x 30 seconds each side PB rollout to center, 5 x 10 seconds PB rollout to left and right, alternating; 5 x 10 seconds each side.  Instructed patient in LE strengthening exercise: Standing with 2.5# ankle weight: -hip flexion march 2x10 reps each LE -knee flexion hamstring curl, 2x10 reps each LE   STS: no UE support, 2 x 10 reps. Attempted squats at bar with difficulty following multimodal cues for body mechanics.   Instructed patient in balance exercise:  Standing with CGA next to support surface:    Standing on 1/2 bolster (round side up):             -feet apart: heel/toe rock x10 reps;             -feet apart: unsupported standing 10 sec hold, progressed to lateral head turns side/side (5 second hold with each head turn) x5 reps             Tandem stance with frequent UE assist, attempted 60 seconds with each foot in front, more unsteady with right foot ahead of left foot;   SLS on firm surface; progressions provided from SLS with opposite toe touch down > SLS. VC on muscle activation for improved stability. Pt reported pain in R hip. Improved technique with decreased UE assist with toe touch down. 3x30 seconds each side.     Pt educated throughout session about proper posture and technique with exercises. Improved exercise technique, movement at target joints, use of target muscles after min to mod verbal, visual, tactile cues.  Patient required min VCs for balance stability, including to increase trunk control for less loss of balance with smaller base of support    Clinical Impression: Pt LE strengthening was progressed with increased sets and the addition of functional STS. Pt was limited throughout session by R hip and lumbar pain that seemed to be associated with all standing exercises including SLS. Lumbar/hip pain could be associated with weak core/hip/glute musculature. Would benefit from leg length assessment next  session. She would benefit from additional skilled PT intervention to improve strength, balance and mobility.            PT Short Term Goals - 03/05/21 1530       PT SHORT TERM GOAL #1   Title Patient will be adherent to HEP at least 3x a week to improve functional strength and balance for better safety at home.  Time 4    Period Weeks    Status New    Target Date 04/02/21      PT SHORT TERM GOAL #2   Title Patient (> 70 years old) will complete five times sit to stand test in < 15 seconds indicating an increased LE strength and improved balance.    Time 4    Period Weeks    Status New    Target Date 04/02/21               PT Long Term Goals - 03/05/21 1531       PT LONG TERM GOAL #1   Title Patient will increase six minute walk test distance to >1300 for progression to community ambulator and improve gait ability closer to age group norms.    Time 8    Period Weeks    Status New    Target Date 04/30/21      PT LONG TERM GOAL #2   Title Patient will increase BLE gross strength to 4+/5 as to improve functional strength for independent gait, increased standing tolerance and increased ADL ability.    Time 8    Period Weeks    Status New    Target Date 04/30/21      PT LONG TERM GOAL #3   Title Patient will tolerate 5 seconds of single leg stance without loss of balance to improve ability to get in and out of shower safely.    Time 8    Period Weeks    Status New    Target Date 04/30/21      PT LONG TERM GOAL #4   Title Patient will improve FOTO score by 5% to indicate improved functional mobility with ADLs.    Time 8    Period Weeks    Status New    Target Date 04/30/21                   Plan - 03/28/21 1602     Clinical Impression Statement Pt LE strengthening was progressed with increased sets and the addition of functional STS. Pt was limited throughout session by R hip and lumbar pain that seemed to be associated with all standing  exercises including SLS. Lumbar/hip pain could be associated with weak core/hip/glute musculature. Would benefit from leg length assessment next session. She would benefit from additional skilled PT intervention to improve strength, balance and mobility.    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, HTN (somewhat controlled), GERD, burning in feet (possible neuropathy),PVD,  plantar fasciitis, melanoma removed (10 years ago), hypercholesterolemia, anxiety, TIA (16 years ago)    Examination-Activity Limitations Bend;Lift;Locomotion Level;Squat;Stairs;Stand;Transfers    Examination-Participation Restrictions Cleaning;Community Activity;Shop;Volunteer;Yard Work    Stability/Clinical Decision Making Stable/Uncomplicated    Rehab Potential Good    PT Frequency 2x / week    PT Duration 8 weeks    PT Treatment/Interventions Cryotherapy;Moist Heat;Gait training;Stair training;Functional mobility training;Therapeutic activities;Therapeutic exercise;Balance training;Neuromuscular re-education;Patient/family education;Energy conservation    PT Next Visit Plan further assess balance, initiate HEP for UE/LE strengthening    PT Home Exercise Plan will address next session    Consulted and Agree with Plan of Care Patient             Patient will benefit from skilled therapeutic intervention in order to improve the following deficits and impairments:  Decreased endurance, Decreased mobility, Difficulty walking, Increased edema, Cardiopulmonary  status limiting activity, Decreased activity tolerance, Decreased strength  Visit Diagnosis: Abnormality of gait and mobility  Other lack of coordination  Difficulty in walking, not elsewhere classified  Unsteadiness on feet  Muscle weakness (generalized)  Other abnormalities of gait and mobility     Problem List There are no  problems to display for this patient.   Patrina Levering PT, [place] MAIN [facility] SERVICES [address] [place], [place], [address] Phone: [phone]   Fax:  [phone]  Name: Laura Cole MRN: [id number] Date of Birth: Oct 06, 1940

## 2021-04-03 ENCOUNTER — Ambulatory Visit: Payer: Medicare Other | Admitting: Physical Therapy

## 2021-04-03 ENCOUNTER — Encounter: Payer: Medicare Other | Admitting: Occupational Therapy

## 2021-04-03 ENCOUNTER — Encounter: Payer: Self-pay | Admitting: Physical Therapy

## 2021-04-03 ENCOUNTER — Other Ambulatory Visit: Payer: Self-pay

## 2021-04-03 DIAGNOSIS — R2689 Other abnormalities of gait and mobility: Secondary | ICD-10-CM

## 2021-04-03 DIAGNOSIS — M6281 Muscle weakness (generalized): Secondary | ICD-10-CM | POA: Diagnosis not present

## 2021-04-03 DIAGNOSIS — R262 Difficulty in walking, not elsewhere classified: Secondary | ICD-10-CM

## 2021-04-03 DIAGNOSIS — R278 Other lack of coordination: Secondary | ICD-10-CM

## 2021-04-03 DIAGNOSIS — R2681 Unsteadiness on feet: Secondary | ICD-10-CM

## 2021-04-03 NOTE — Therapy (Signed)
[place] MAIN [facility] SERVICES [address] [place], [place], [address] Phone: [phone]   Fax:  [phone]  Physical Therapy Treatment  Patient Details  Name: Laura Cole MRN: [id number] Date of Birth: 1940/07/18 Referring Provider (PT): Dr. Harrel Lemon   Encounter Date: 04/03/2021   PT End of Session - 04/03/21 1128     Visit Number 5    Number of Visits 17    Date for PT Re-Evaluation 04/30/21    Authorization Type Medicare, start of care 03/05/21    PT Start Time 1140    PT Stop Time 1225    PT Time Calculation (min) 45 min    Equipment Utilized During Treatment Gait belt    Activity Tolerance Patient tolerated treatment well    Behavior During Therapy WFL for tasks assessed/performed             Past Medical History:  Diagnosis Date   Bradycardia    Cancer ([place])    skin cancer   Coronary artery disease    Diverticulitis    GERD (gastroesophageal reflux disease)    Heart murmur    Hypertension    Hypothyroidism    Osteoarthritis    Peripheral vascular disease ([place])    TIA (transient ischemic attack)     Past Surgical History:  Procedure Laterality Date   ABDOMINAL HYSTERECTOMY     BACK SURGERY     CHOLECYSTECTOMY     COLON SURGERY     COLONOSCOPY WITH PROPOFOL N/A 12/05/2019   Procedure: COLONOSCOPY WITH PROPOFOL;  Surgeon: Lesly Rubenstein, MD;  Location: [facility] ENDOSCOPY;  Service: Endoscopy;  Laterality: N/A;   ESOPHAGOGASTRODUODENOSCOPY (EGD) WITH PROPOFOL N/A 12/05/2019   Procedure: ESOPHAGOGASTRODUODENOSCOPY (EGD) WITH PROPOFOL;  Surgeon: Lesly Rubenstein, MD;  Location: [facility] ENDOSCOPY;  Service: Endoscopy;  Laterality: N/A;   EYE SURGERY     RECTOCELE REPAIR     RECTOCELE REPAIR     REPLACEMENT TOTAL KNEE Bilateral    TKRX2 Bilateral    WRIST SURGERY      There were no vitals filed for this visit.   Subjective Assessment - 04/03/21 1143     Subjective Patient reports still having soreness in  legs/knees. She is not sure if its the exercise or her clothing; She reports she just doesn't feel right. Patient reports having trouble wiht BP; its normal in the morning but by the afternoon her BP is elevated;    Pertinent History 80 yo Female reports increased weakness in BLE. She reports she is able to walk normally on level surface but when she walks outside on unlevel surface, she has to use a cane and her left foot drags. She reports doing well until COVID started and then when COVID Started she stayed home and sat most of  the time. She does believe that inactivity and older age has contributed to weakness. She also reports weakness in her hands and states she will drop things especially if its light. She denies any tingling but has had some skin changes in her hands as a result of repeated antibiotic use which does affect sensitivity in hands. 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 She denies any recent falls.    Limitations Standing;Walking    How long can you sit comfortably? 1 hour, does get up and move around    How long can you stand comfortably? only able to stand flat footed for 5 min, if she is  able to move around she does okay;    How long can you walk comfortably? about 750 feet with increased shortness of breath; Pt used to walk around [employer] (about 2-3 laps) daily but states 6 months ago she got bronchitis and it lasted a while and she hasn't been able to start back.    Patient Stated Goals "Walk farther than I can currently walk. Be able to do things where I don't hurt. I want to be self sufficient as long as I can."    Currently in Pain? Yes    Pain Score 2     Pain Location Knee    Pain Orientation Right;Left    Pain Descriptors / Indicators Aching    Pain Type Chronic pain    Pain Onset More than a month ago    Pain Frequency Intermittent    Aggravating Factors  worse with standing/walking    Pain Relieving Factors  rest/stretch    Effect of Pain on Daily Activities decreased activity tolerance;    Multiple Pain Sites No                   TREATMENT: BP 136/52 at start of session;     Instructed patient in LE strengthening exercise: Seated with green tband: Ankle DF x10 reps each LE, required min VCs for proper positioning of band;  Standing: BLE heel raise x10 reps, minimal difficulty reported Attempted single leg heel raise, patient unable to complete;  BLE heel raise, 3 sec hold, unsupported to challenge ankle control x10 reps  Seated green tband hamstring curl x10 reps each LE; patient denies any increase in knee pain during exercise;   Standing with red tband around BLE: -hip abduction x10 reps each LE -hip extension x10 reps each LE -side stepping x10 feet x2 laps each direction Patient required intermittent rail assist; She required min VCS for proper positioning and exercise technique;   Advanced HEP- see patient instructions; Patient does require min Vcs for proper exercise technique and positioning. She denies any increase in pain with advanced exercise;  Instructed patient in balance exercise:  Standing with CGA next to support surface:  Airex pad:  -forward /backward step with 1-0 rail assist x10 reps;  -staggered stance with one on each airex pad:              Unsupported hold 15 sec hold             Progressed to lateral head turns side/side x5 reps each with CGA for safety;  Progressed to BUE arm raise with looking up/down x5 reps;  Patient required min VCs for balance stability, including to increase trunk control for less loss of balance with smaller base of support      Pt educated throughout session about proper posture and technique with exercises. Improved exercise technique, movement at target joints, use of target muscles after min to mod verbal, visual, tactile cues. She denies any increase in pain at end of session. However she does report increased knee  discomfort overall and reports she will be following up with MD;                            PT Education - 04/03/21 1128     Education Details LE strengthening, positioning/balance;    Person(s) Educated Patient    Methods Explanation;Verbal cues    Comprehension Verbalized understanding;Returned demonstration;Verbal cues required;Need further instruction  PT Short Term Goals - 03/05/21 1530       PT SHORT TERM GOAL #1   Title Patient will be adherent to HEP at least 3x a week to improve functional strength and balance for better safety at home.    Time 4    Period Weeks    Status New    Target Date 04/02/21      PT SHORT TERM GOAL #2   Title Patient (> 57 years old) will complete five times sit to stand test in < 15 seconds indicating an increased LE strength and improved balance.    Time 4    Period Weeks    Status New    Target Date 04/02/21               PT Long Term Goals - 03/05/21 1531       PT LONG TERM GOAL #1   Title Patient will increase six minute walk test distance to >1300 for progression to community ambulator and improve gait ability closer to age group norms.    Time 8    Period Weeks    Status New    Target Date 04/30/21      PT LONG TERM GOAL #2   Title Patient will increase BLE gross strength to 4+/5 as to improve functional strength for independent gait, increased standing tolerance and increased ADL ability.    Time 8    Period Weeks    Status New    Target Date 04/30/21      PT LONG TERM GOAL #3   Title Patient will tolerate 5 seconds of single leg stance without loss of balance to improve ability to get in and out of shower safely.    Time 8    Period Weeks    Status New    Target Date 04/30/21      PT LONG TERM GOAL #4   Title Patient will improve FOTO score by 5% to indicate improved functional mobility with ADLs.    Time 8    Period Weeks    Status New    Target Date 04/30/21                    Plan - 04/03/21 1312     Clinical Impression Statement Patient motivated and participated well within session. She does continue to have intermittent knee pain which is tender to touch. She reports she will follow up with MD for assessment as she is not sure if this is from the exercise or something else. Patient instructed in advanced LE strengthening. Utilized tband for resistance. Advanced HEP with LE strengthening. She does require min VCS for proper positioning and exercise technique. Patient also instructed in balance exercise utilizing compliant surface to challenge stance control. She does require CGA with reduced rail asssist. 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, HTN (somewhat controlled), GERD, burning in feet (possible neuropathy),PVD,  plantar fasciitis, melanoma removed (10 years ago), hypercholesterolemia, anxiety, TIA (16 years ago)    Examination-Activity Limitations Bend;Lift;Locomotion Level;Squat;Stairs;Stand;Transfers    Examination-Participation Restrictions Cleaning;Community Activity;Shop;Volunteer;Yard Work    Stability/Clinical Decision Making Stable/Uncomplicated    Rehab Potential Good    PT Frequency 2x / week    PT Duration 8 weeks    PT Treatment/Interventions Cryotherapy;Moist Heat;Gait training;Stair  training;Functional mobility training;Therapeutic activities;Therapeutic exercise;Balance training;Neuromuscular re-education;Patient/family education;Energy conservation    PT Next Visit Plan further assess balance, initiate HEP for UE/LE strengthening    PT Home Exercise Plan will address next session    Consulted and Agree with Plan of Care Patient             Patient will benefit from skilled therapeutic  intervention in order to improve the following deficits and impairments:  Decreased endurance, Decreased mobility, Difficulty walking, Increased edema, Cardiopulmonary status limiting activity, Decreased activity tolerance, Decreased strength  Visit Diagnosis: Muscle weakness (generalized)  Unsteadiness on feet  Difficulty in walking, not elsewhere classified  Other abnormalities of gait and mobility  Other lack of coordination     Problem List There are no problems to display for this patient.   Joh Rao, PT, DPT 04/03/2021, 1:17 PM  [place] MAIN [facility] SERVICES [address] [place], [place], [address] Phone: [phone]   Fax:  [phone]  Name: Laura Cole MRN: [id number] Date of Birth: 1940-12-04

## 2021-04-03 NOTE — Patient Instructions (Signed)
Access Code: [email or web address] URL: [email or web address] Date: 04/03/2021 Prepared by: Blanche East  Exercises Seated Ankle Dorsiflexion with Resistance - 1 x daily - 7 x weekly - 2 sets - 10 reps Standing Heel Raises - 1 x daily - 7 x weekly - 1 sets - 10 reps - 3 sec hold Standing Hip Abduction with Resistance at Ankles and Counter Support - 1 x daily - 7 x weekly - 1 sets - 10 reps Standing Hip Extension with Resistance at Ankles and Counter Support - 1 x daily - 7 x weekly - 1 sets - 10 reps Side Stepping with Resistance at Ankles and Counter Support - 1 x daily - 7 x weekly - 1 sets - 10 reps

## 2021-04-08 ENCOUNTER — Other Ambulatory Visit: Payer: Self-pay

## 2021-04-08 ENCOUNTER — Encounter: Payer: Self-pay | Admitting: Physical Therapy

## 2021-04-08 ENCOUNTER — Ambulatory Visit: Payer: Medicare Other | Admitting: Physical Therapy

## 2021-04-08 VITALS — BP 137/56 | HR 54

## 2021-04-08 DIAGNOSIS — R262 Difficulty in walking, not elsewhere classified: Secondary | ICD-10-CM

## 2021-04-08 DIAGNOSIS — M6281 Muscle weakness (generalized): Secondary | ICD-10-CM | POA: Diagnosis not present

## 2021-04-08 DIAGNOSIS — R2681 Unsteadiness on feet: Secondary | ICD-10-CM

## 2021-04-08 DIAGNOSIS — R2689 Other abnormalities of gait and mobility: Secondary | ICD-10-CM

## 2021-04-08 NOTE — Therapy (Signed)
[place] MAIN [facility] SERVICES [address] [place], [place], [address] Phone: [phone]   Fax:  [phone]  Physical Therapy Treatment  Patient Details  Name: Laura Cole MRN: [id number] Date of Birth: April 04, 1941 Referring Provider (PT): Dr. Harrel Lemon   Encounter Date: 04/08/2021   PT End of Session - 04/08/21 1100     Visit Number 6    Number of Visits 17    Date for PT Re-Evaluation 04/30/21    Authorization Type Medicare, start of care 03/05/21    PT Start Time 1012    PT Stop Time 1100    PT Time Calculation (min) 48 min    Equipment Utilized During Treatment Gait belt    Activity Tolerance Patient tolerated treatment well    Behavior During Therapy WFL for tasks assessed/performed             Past Medical History:  Diagnosis Date   Bradycardia    Cancer ([place])    skin cancer   Coronary artery disease    Diverticulitis    GERD (gastroesophageal reflux disease)    Heart murmur    Hypertension    Hypothyroidism    Osteoarthritis    Peripheral vascular disease ([place])    TIA (transient ischemic attack)     Past Surgical History:  Procedure Laterality Date   ABDOMINAL HYSTERECTOMY     BACK SURGERY     CHOLECYSTECTOMY     COLON SURGERY     COLONOSCOPY WITH PROPOFOL N/A 12/05/2019   Procedure: COLONOSCOPY WITH PROPOFOL;  Surgeon: Lesly Rubenstein, MD;  Location: [facility] ENDOSCOPY;  Service: Endoscopy;  Laterality: N/A;   ESOPHAGOGASTRODUODENOSCOPY (EGD) WITH PROPOFOL N/A 12/05/2019   Procedure: ESOPHAGOGASTRODUODENOSCOPY (EGD) WITH PROPOFOL;  Surgeon: Lesly Rubenstein, MD;  Location: [facility] ENDOSCOPY;  Service: Endoscopy;  Laterality: N/A;   EYE SURGERY     RECTOCELE REPAIR     RECTOCELE REPAIR     REPLACEMENT TOTAL KNEE Bilateral    TKRX2 Bilateral    WRIST SURGERY      Vitals:   04/08/21 1016  BP: (!) 137/56  Pulse: (!) 54     Subjective Assessment - 04/08/21 1016     Subjective Patient reports still  having trouble with her BP. She reports waking up this morning at 4AM and feeling like her heart was racing and her BP was bounding. She states, "Its still not right." She also feels like she is getting blood clots in her legs. She has tenderness but no increase in swelling or rednesss; She reports having some soreness after doing side stepping at home and therefore discontinued;    Pertinent History 80 yo Female reports increased weakness in BLE. She reports she is able to walk normally on level surface but when she walks outside on unlevel surface, she has to use a cane and her left foot drags. She reports doing well until COVID started and then when COVID Started she stayed home and sat most of  the time. She does believe that inactivity and older age has contributed to weakness. She also reports weakness in her hands and states she will drop things especially if its light. She denies any tingling but has had some skin changes in her hands as a result of repeated antibiotic use which does affect sensitivity in hands. 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 She denies any recent falls.    Limitations Standing;Walking    How long  can you sit comfortably? 1 hour, does get up and move around    How long can you stand comfortably? only able to stand flat footed for 5 min, if she is able to move around she does okay;    How long can you walk comfortably? about 750 feet with increased shortness of breath; Pt used to walk around [employer] (about 2-3 laps) daily but states 6 months ago she got bronchitis and it lasted a while and she hasn't been able to start back.    Patient Stated Goals "Walk farther than I can currently walk. Be able to do things where I don't hurt. I want to be self sufficient as long as I can."    Currently in Pain? Yes    Pain Score 2     Pain Location Knee    Pain Orientation Right;Left    Pain Descriptors / Indicators Aching;Sore    Pain  Type Chronic pain    Pain Onset More than a month ago    Pain Frequency Intermittent    Aggravating Factors  worse with standing/walking    Pain Relieving Factors rest/stretch    Effect of Pain on Daily Activities decreased activity tolerance;    Multiple Pain Sites No                     TREATMENT: BP 136/52 at start of session;    Instructed patient in LE strengthening:   Standing: BLE heel raise x10 reps, minimal difficulty reported  Standing  with 2# ankle weight around BLE: -hip abduction x10 reps each LE -hip flexion march x10 reps each LE -hip extension x10 reps each LE -side stepping x10 feet x2 laps each direction Patient required intermittent rail assist; She required min VCS for proper positioning and exercise technique;   Seated LAQ 2#, 5 sec hold x10 reps each LE;   -Forward lunges to airex pad with 1 rail assist x10 reps each LE   Required min VCs for proper positioning/exercise technique to reduce knee discomfort  Finished with long sitting hamstring stretch 20 sec hold x2 reps each LE Finished with using rolling stick to each LE quad for tissue extensibility to reduce delayed onset muscle soreness;  Patient does require min Vcs for proper exercise technique and positioning. She denies any increase in pain with advanced exercise;  Instructed patient in balance exercise:  Standing with CGA next to support surface:  Airex pad:  -Feet shoulder width apart:  Head turns up/down x5 reps  Head/trunk rotation side/side x5 reps  Holding ball, squat down and tap ball to floor and then stand up raising ball overhead x10 reps -modified tandem stance:  Unsupported standing 15 sec hold  Progressed to BUE ball pass side/side x5 reps each foot in front; Required CGA for safety  Standing on 1/2 bolster: Feet apart:  Heel/toe rock x10 reps with rail assist for safety  Progressed to unsupported standing with feet apart, feet in neutral:   X15 sec, progressed  to head turns side/side x5 reps Tandem stance:  Unsupported standing 10 sec hold x2 reps each Patient required CGA to min A fo rsafety;   Pt educated throughout session about proper posture and technique with exercises. Improved exercise technique, movement at target joints, use of target muscles after min to mod verbal, visual, tactile cues. She denies any increase in pain at end of session.  PT Education - 04/08/21 1021     Education Details LE strengthening, positioning, balance;    Person(s) Educated Patient    Methods Explanation;Verbal cues    Comprehension Verbalized understanding;Returned demonstration;Verbal cues required;Need further instruction              PT Short Term Goals - 03/05/21 1530       PT SHORT TERM GOAL #1   Title Patient will be adherent to HEP at least 3x a week to improve functional strength and balance for better safety at home.    Time 4    Period Weeks    Status New    Target Date 04/02/21      PT SHORT TERM GOAL #2   Title Patient (> 73 years old) will complete five times sit to stand test in < 15 seconds indicating an increased LE strength and improved balance.    Time 4    Period Weeks    Status New    Target Date 04/02/21               PT Long Term Goals - 03/05/21 1531       PT LONG TERM GOAL #1   Title Patient will increase six minute walk test distance to >1300 for progression to community ambulator and improve gait ability closer to age group norms.    Time 8    Period Weeks    Status New    Target Date 04/30/21      PT LONG TERM GOAL #2   Title Patient will increase BLE gross strength to 4+/5 as to improve functional strength for independent gait, increased standing tolerance and increased ADL ability.    Time 8    Period Weeks    Status New    Target Date 04/30/21      PT LONG TERM GOAL #3   Title Patient will tolerate 5 seconds of single leg stance without loss of  balance to improve ability to get in and out of shower safely.    Time 8    Period Weeks    Status New    Target Date 04/30/21      PT LONG TERM GOAL #4   Title Patient will improve FOTO score by 5% to indicate improved functional mobility with ADLs.    Time 8    Period Weeks    Status New    Target Date 04/30/21                   Plan - 04/08/21 1056     Clinical Impression Statement Patient motivated and participated well within session. She was instructed in advanced LE strengthening, using rail assist as needed for balance. Patient does require min VCs for proper exercise technique. Patient was instructed in LE stretches to help reduce delayed onset muscle soreness. Progressed balance exercise, using compliant surface to challenge stance control. Instructed patient in exercise with 1/2 bolster to challenge ankle control. She does exhibit decreased ankle strategies relying more on hip strategies for balance control. 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, HTN (somewhat controlled), GERD, burning in feet (possible neuropathy),PVD,  plantar fasciitis, melanoma removed (10 years ago), hypercholesterolemia, anxiety, TIA (16 years ago)    Examination-Activity Limitations Bend;Lift;Locomotion Level;Squat;Stairs;Stand;Transfers    Examination-Participation Restrictions Cleaning;Community  Activity;Shop;Volunteer;Yard Work    Stability/Clinical Decision Making Stable/Uncomplicated    Rehab Potential Good    PT Frequency 2x / week    PT Duration 8 weeks    PT Treatment/Interventions Cryotherapy;Moist Heat;Gait training;Stair training;Functional mobility training;Therapeutic activities;Therapeutic exercise;Balance training;Neuromuscular  re-education;Patient/family education;Energy conservation    PT Next Visit Plan further assess balance, initiate HEP for UE/LE strengthening    PT Home Exercise Plan will address next session    Consulted and Agree with Plan of Care Patient             Patient will benefit from skilled therapeutic intervention in order to improve the following deficits and impairments:  Decreased endurance, Decreased mobility, Difficulty walking, Increased edema, Cardiopulmonary status limiting activity, Decreased activity tolerance, Decreased strength  Visit Diagnosis: Muscle weakness (generalized)  Unsteadiness on feet  Difficulty in walking, not elsewhere classified  Other abnormalities of gait and mobility     Problem List There are no problems to display for this patient.   Lucilla Petrenko, PT, DPT 04/08/2021, 11:00 AM  [place] MAIN [facility] SERVICES [address] [place], [place], [address] Phone: [phone]   Fax:  [phone]  Name: Laura Cole MRN: [id number] Date of Birth: 1941-02-07

## 2021-04-10 ENCOUNTER — Encounter: Payer: Self-pay | Admitting: Physical Therapy

## 2021-04-10 ENCOUNTER — Ambulatory Visit: Payer: Medicare Other

## 2021-04-10 ENCOUNTER — Ambulatory Visit: Payer: Medicare Other | Admitting: Physical Therapy

## 2021-04-10 ENCOUNTER — Other Ambulatory Visit: Payer: Self-pay

## 2021-04-10 VITALS — BP 115/49

## 2021-04-10 DIAGNOSIS — R262 Difficulty in walking, not elsewhere classified: Secondary | ICD-10-CM

## 2021-04-10 DIAGNOSIS — R2681 Unsteadiness on feet: Secondary | ICD-10-CM

## 2021-04-10 DIAGNOSIS — R278 Other lack of coordination: Secondary | ICD-10-CM

## 2021-04-10 DIAGNOSIS — M6281 Muscle weakness (generalized): Secondary | ICD-10-CM

## 2021-04-10 DIAGNOSIS — R2689 Other abnormalities of gait and mobility: Secondary | ICD-10-CM

## 2021-04-10 NOTE — Therapy (Signed)
[place] MAIN [facility] SERVICES [address] [place], [place], [address] Phone: [phone]   Fax:  [phone]  Physical Therapy Treatment  Patient Details  Name: Laura Cole MRN: [id number] Date of Birth: [birth year] Referring Provider (PT): Dr. Harrel Lemon   Encounter Date: 04/10/2021   PT End of Session - 04/10/21 1013     Visit Number 7    Number of Visits 17    Date for PT Re-Evaluation 04/30/21    Authorization Type Medicare, start of care 03/05/21    PT Start Time 1005    PT Stop Time 2595    PT Time Calculation (min) 42 min    Equipment Utilized During Treatment Gait belt    Activity Tolerance Patient tolerated treatment well    Behavior During Therapy WFL for tasks assessed/performed             Past Medical History:  Diagnosis Date   Bradycardia    Cancer ([place])    skin cancer   Coronary artery disease    Diverticulitis    GERD (gastroesophageal reflux disease)    Heart murmur    Hypertension    Hypothyroidism    Osteoarthritis    Peripheral vascular disease ([place])    TIA (transient ischemic attack)     Past Surgical History:  Procedure Laterality Date   ABDOMINAL HYSTERECTOMY     BACK SURGERY     CHOLECYSTECTOMY     COLON SURGERY     COLONOSCOPY WITH PROPOFOL N/A 12/05/2019   Procedure: COLONOSCOPY WITH PROPOFOL;  Surgeon: Lesly Rubenstein, MD;  Location: [facility] ENDOSCOPY;  Service: Endoscopy;  Laterality: N/A;   ESOPHAGOGASTRODUODENOSCOPY (EGD) WITH PROPOFOL N/A 12/05/2019   Procedure: ESOPHAGOGASTRODUODENOSCOPY (EGD) WITH PROPOFOL;  Surgeon: Lesly Rubenstein, MD;  Location: [facility] ENDOSCOPY;  Service: Endoscopy;  Laterality: N/A;   EYE SURGERY     RECTOCELE REPAIR     RECTOCELE REPAIR     REPLACEMENT TOTAL KNEE Bilateral    TKRX2 Bilateral    WRIST SURGERY      Vitals:   04/10/21 1008  BP: (!) 115/49     Subjective Assessment - 04/10/21 1004     Subjective Patient reports increased lateral foot  pain on right foot. She states it started hurting about 2 hours after last session. It has bothered her in the past. She isn't sure if its something we did or if it would hurt anyway. She is still having trouble with BP management;    Pertinent History 80 yo Female reports increased weakness in BLE. She reports she is able to walk normally on level surface but when she walks outside on unlevel surface, she has to use a cane and her left foot drags. She reports doing well until COVID started and then when COVID Started she stayed home and sat most of  the time. She does believe that inactivity and older age has contributed to weakness. She also reports weakness in her hands and states she will drop things especially if its light. She denies any tingling but has had some skin changes in her hands as a result of repeated antibiotic use which does affect sensitivity in hands. 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 She denies any recent falls.    Limitations Standing;Walking    How long can you sit comfortably? 1 hour, does get up and move around    How long can you stand comfortably? only able to stand  flat footed for 5 min, if she is able to move around she does okay;    How long can you walk comfortably? about 750 feet with increased shortness of breath; Pt used to walk around [employer] (about 2-3 laps) daily but states 6 months ago she got bronchitis and it lasted a while and she hasn't been able to start back.    Patient Stated Goals "Walk farther than I can currently walk. Be able to do things where I don't hurt. I want to be self sufficient as long as I can."    Currently in Pain? Yes    Pain Location Foot    Pain Orientation Right;Lateral    Pain Descriptors / Indicators Sharp    Pain Type Acute pain    Pain Onset In the past 7 days    Pain Frequency Intermittent    Aggravating Factors  worse with some steps    Pain Relieving Factors no pain at rest     Effect of Pain on Daily Activities decreased walking tolerance; has to be careful;    Multiple Pain Sites No              TREATMENT: BP lower this AM, denies any dizziness or symptoms; Will continue to monitor;    Instructed patient in LE strengthening:   Standing: Forward step ups on 6 inch step with 2 rail assist x10 reps each LE Standing on bottom step with heel off, to facilitate increased AROM, BLE heel raise x15 reps, minimal difficulty reported  Sit<>stand holding small ball with ball overhead raise x10 reps;   Standing  with 2# ankle weight around BLE: -hip abduction x12 reps each LE -hip flexion march x12 reps each LE -hip extension x12 reps each LE Patient required intermittent rail assist; She required min VCS for proper positioning and exercise technique;   Patient does require min Vcs for proper exercise technique and positioning. She denies any increase in pain with advanced exercise;  Instructed patient in balance exercise:  Standing with CGA next to support surface:  Airex pad:  -Feet shoulder width apart:            eyes open 30 sec hold, eyes closed 10 sec hold x2   -alternate toe taps to 2nd step (8 inch height) x10 reps each unsupported with min A for safety  Standing one foot on airex, one foot on 2nd step for modified SLS   Unsupported standing 10 sec hold   Head turns side/side x5 reps   Head turns up/down x5 reps Standing on airex in open space:  Bending down picking up cones #3 and then placing them back on the floor x2 sets; Patient required CGA to min A for safety with increased instability with reduced rail assist;   BP after balance exercise: 113/54, HR 53  Instructed patient in dynamic balance: Gait in hallway: Forward with head turns side/side x80 feet x2 laps Forward with head turns up/down x80 feet x2 laps Side stepping diagonals forward/backward with ball tap on wall x20 feet each Required CGA for safety; Patient exhibits minimal  veering but does exhibit some instability especially with lateral head turns BP after gait: 123/53, HR 53    Pt educated throughout session about proper posture and technique with exercises. Improved exercise technique, movement at target joints, use of target muscles after min to mod verbal, visual, tactile cues. She denies any increase in pain at end of session.  PT Education - 04/10/21 1014     Education Details LE strengthening, balance, safety;    Person(s) Educated Patient    Methods Explanation;Verbal cues    Comprehension Verbalized understanding;Returned demonstration;Verbal cues required;Need further instruction              PT Short Term Goals - 03/05/21 1530       PT SHORT TERM GOAL #1   Title Patient will be adherent to HEP at least 3x a week to improve functional strength and balance for better safety at home.    Time 4    Period Weeks    Status New    Target Date 04/02/21      PT SHORT TERM GOAL #2   Title Patient (> 46 years old) will complete five times sit to stand test in < 15 seconds indicating an increased LE strength and improved balance.    Time 4    Period Weeks    Status New    Target Date 04/02/21               PT Long Term Goals - 03/05/21 1531       PT LONG TERM GOAL #1   Title Patient will increase six minute walk test distance to >1300 for progression to community ambulator and improve gait ability closer to age group norms.    Time 8    Period Weeks    Status New    Target Date 04/30/21      PT LONG TERM GOAL #2   Title Patient will increase BLE gross strength to 4+/5 as to improve functional strength for independent gait, increased standing tolerance and increased ADL ability.    Time 8    Period Weeks    Status New    Target Date 04/30/21      PT LONG TERM GOAL #3   Title Patient will tolerate 5 seconds of single leg stance without loss of balance to improve ability to  get in and out of shower safely.    Time 8    Period Weeks    Status New    Target Date 04/30/21      PT LONG TERM GOAL #4   Title Patient will improve FOTO score by 5% to indicate improved functional mobility with ADLs.    Time 8    Period Weeks    Status New    Target Date 04/30/21                   Plan - 04/10/21 1049     Clinical Impression Statement Patient motivated and participated well within session. Patient instructed in advanced LE strengthening. She requires min VCS for correct positioning for optimal muscle activation. Patient tolerated exercise well. She denies any increase in pain. Patient's vitals monitored throughout with good BP response. Advanced balance exercise, progressing with reduced rail assist. Initiated dynamic balance exercise with gait in hallway. Patient does require CGA to min A for safety. She would benefit from additional skilled PT intervention to improve strength, balance and mobility. Plan to address goals next visit;    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, HTN (somewhat controlled), GERD, burning in feet (possible neuropathy),PVD,  plantar fasciitis, melanoma removed (10 years ago), hypercholesterolemia, anxiety, TIA (16 years ago)    Examination-Activity Limitations Bend;Lift;Locomotion Level;Squat;Stairs;Stand;Transfers    Examination-Participation Restrictions Cleaning;Community Activity;Shop;Volunteer;[employer] Work  Stability/Clinical Decision Making Stable/Uncomplicated    Rehab Potential Good    PT Frequency 2x / week    PT Duration 8 weeks    PT Treatment/Interventions Cryotherapy;Moist Heat;Gait training;Stair training;Functional mobility training;Therapeutic activities;Therapeutic exercise;Balance training;Neuromuscular re-education;Patient/family education;Energy conservation    PT Next Visit Plan further  assess balance, initiate HEP for UE/LE strengthening    PT Home Exercise Plan will address next session    Consulted and Agree with Plan of Care Patient             Patient will benefit from skilled therapeutic intervention in order to improve the following deficits and impairments:  Decreased endurance, Decreased mobility, Difficulty walking, Increased edema, Cardiopulmonary status limiting activity, Decreased activity tolerance, Decreased strength  Visit Diagnosis: Muscle weakness (generalized)  Unsteadiness on feet  Difficulty in walking, not elsewhere classified  Other abnormalities of gait and mobility  Other lack of coordination     Problem List There are no problems to display for this patient.   Percell Lamboy, PT, DPT 04/10/2021, 10:56 AM  [place] MAIN [facility] SERVICES [address] [place], [place], [address] Phone: [phone]   Fax:  [phone]  Name: Raylea Adcox MRN: [id number] Date of Birth: 1940/08/30

## 2021-04-17 ENCOUNTER — Encounter: Payer: Self-pay | Admitting: Physical Therapy

## 2021-04-17 ENCOUNTER — Ambulatory Visit: Payer: Medicare Other | Admitting: Physical Therapy

## 2021-04-17 ENCOUNTER — Other Ambulatory Visit: Payer: Self-pay

## 2021-04-17 DIAGNOSIS — M6281 Muscle weakness (generalized): Secondary | ICD-10-CM | POA: Diagnosis not present

## 2021-04-17 DIAGNOSIS — R2681 Unsteadiness on feet: Secondary | ICD-10-CM

## 2021-04-17 DIAGNOSIS — R262 Difficulty in walking, not elsewhere classified: Secondary | ICD-10-CM

## 2021-04-17 DIAGNOSIS — R2689 Other abnormalities of gait and mobility: Secondary | ICD-10-CM

## 2021-04-17 DIAGNOSIS — R278 Other lack of coordination: Secondary | ICD-10-CM

## 2021-04-17 NOTE — Therapy (Signed)
[place] MAIN [facility] SERVICES [address] [place], [place], [address] Phone: [phone]   Fax:  [phone]  Physical Therapy Treatment  Patient Details  Name: Laura Cole MRN: [id number] Date of Birth: January 07, 1941 Referring Provider (PT): Dr. Harrel Lemon   Encounter Date: 04/17/2021   PT End of Session - 04/17/21 1142     Visit Number 8    Number of Visits 17    Date for PT Re-Evaluation 04/30/21    Authorization Type Medicare, start of care 03/05/21    PT Start Time 1143    PT Stop Time 1230    PT Time Calculation (min) 47 min    Equipment Utilized During Treatment Gait belt    Activity Tolerance Patient tolerated treatment well    Behavior During Therapy WFL for tasks assessed/performed             Past Medical History:  Diagnosis Date   Bradycardia    Cancer ([place])    skin cancer   Coronary artery disease    Diverticulitis    GERD (gastroesophageal reflux disease)    Heart murmur    Hypertension    Hypothyroidism    Osteoarthritis    Peripheral vascular disease ([place])    TIA (transient ischemic attack)     Past Surgical History:  Procedure Laterality Date   ABDOMINAL HYSTERECTOMY     BACK SURGERY     CHOLECYSTECTOMY     COLON SURGERY     COLONOSCOPY WITH PROPOFOL N/A 12/05/2019   Procedure: COLONOSCOPY WITH PROPOFOL;  Surgeon: Lesly Rubenstein, MD;  Location: [facility] ENDOSCOPY;  Service: Endoscopy;  Laterality: N/A;   ESOPHAGOGASTRODUODENOSCOPY (EGD) WITH PROPOFOL N/A 12/05/2019   Procedure: ESOPHAGOGASTRODUODENOSCOPY (EGD) WITH PROPOFOL;  Surgeon: Lesly Rubenstein, MD;  Location: [facility] ENDOSCOPY;  Service: Endoscopy;  Laterality: N/A;   EYE SURGERY     RECTOCELE REPAIR     RECTOCELE REPAIR     REPLACEMENT TOTAL KNEE Bilateral    TKRX2 Bilateral    WRIST SURGERY      There were no vitals filed for this visit.   Subjective Assessment - 04/17/21 1145     Subjective Patient reports "I don't know what's  been doing it but my feet have been hurting more." She also reports she was able to get a new apartment and moves on 12/14;    Pertinent History 80 yo Female reports increased weakness in BLE. She reports she is able to walk normally on level surface but when she walks outside on unlevel surface, she has to use a cane and her left foot drags. She reports doing well until COVID started and then when COVID Started she stayed home and sat most of  the time. She does believe that inactivity and older age has contributed to weakness. She also reports weakness in her hands and states she will drop things especially if its light. She denies any tingling but has had some skin changes in her hands as a result of repeated antibiotic use which does affect sensitivity in hands. 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 She denies any recent falls.    Limitations Standing;Walking    How long can you sit comfortably? 1 hour, does get up and move around    How long can you stand comfortably? only able to stand flat footed for 5 min, if she is able to move around she does okay;    How long can  you walk comfortably? about 750 feet with increased shortness of breath; Pt used to walk around [employer] (about 2-3 laps) daily but states 6 months ago she got bronchitis and it lasted a while and she hasn't been able to start back.    Patient Stated Goals "Walk farther than I can currently walk. Be able to do things where I don't hurt. I want to be self sufficient as long as I can."    Currently in Pain? Yes    Pain Score 2     Pain Location Foot    Pain Orientation Right;Left    Pain Descriptors / Indicators Sharp    Pain Type Acute pain    Pain Onset In the past 7 days    Pain Frequency Intermittent    Aggravating Factors  worse with walking or when stretching foot in bed    Pain Relieving Factors minimal pain at rest    Effect of Pain on Daily Activities decreased walking  tolerance; has to be careful;    Multiple Pain Sites No                [facility] PT Assessment - 04/17/21 0001       Observation/Other Assessments   Focus on Therapeutic Outcomes (FOTO)  52%      Strength   Right Hip Flexion 4-/5    Right Hip Extension 3-/5    Right Hip ABduction 4/5    Right Hip ADduction 3-/5    Left Hip Flexion 3+/5    Left Hip Extension 3-/5    Left Hip ABduction 4-/5    Left Hip ADduction 3/5    Right Knee Flexion 3+/5    Right Knee Extension 4/5    Left Knee Flexion 3/5    Left Knee Extension 4/5    Right Ankle Dorsiflexion 4/5    Left Ankle Dorsiflexion 4/5      6 Minute walk- Post Test   BP (mmHg) 149/55    HR (bpm) 50      6 minute walk test results    Aerobic Endurance Distance Walked 1085    Endurance additional comments with and without SPC, slight improvement from 03/05/21 which was 1060 feet      Standardized Balance Assessment   Five times sit to stand comments  15.01 sec with arms across chest ( improved from 19 sec on 03/05/21)               TREATMENT: PT instructed patient in outcome measures see above  She does exhibit improvement in strength, transfers and gait ability  Reinforced HEP; discussed plan of care with plan to continue for another 4-6 weeks to build up on strength, balance and mobility; Patient agreeable;                      PT Education - 04/17/21 1148     Education Details progress towards goals;    Person(s) Educated Patient    Methods Explanation;Verbal cues    Comprehension Verbalized understanding;Returned demonstration;Verbal cues required;Need further instruction              PT Short Term Goals - 04/17/21 1148       PT SHORT TERM GOAL #1   Title Patient will be adherent to HEP at least 3x a week to improve functional strength and balance for better safety at home.    Baseline 11/30: 4-5 days    Time 4    Period  Weeks    Status Achieved    Target Date 04/02/21       PT SHORT TERM GOAL #2   Title Patient (> 24 years old) will complete five times sit to stand test in < 15 seconds indicating an increased LE strength and improved balance.    Baseline 11/30: 15.01 sec with arms across chest    Time 4    Period Weeks    Status Partially Met    Target Date 04/02/21               PT Long Term Goals - 04/17/21 1150       PT LONG TERM GOAL #1   Title Patient will increase six minute walk test distance to >1300 for progression to community ambulator and improve gait ability closer to age group norms.    Baseline 11/30: 1085 feet    Time 8    Period Weeks    Status Not Met    Target Date 04/30/21      PT LONG TERM GOAL #2   Title Patient will increase BLE gross strength to 4+/5 as to improve functional strength for independent gait, increased standing tolerance and increased ADL ability.    Baseline 11/30: see flowsheet    Time 8    Period Weeks    Status Partially Met    Target Date 04/30/21      PT LONG TERM GOAL #3   Title Patient will tolerate 5 seconds of single leg stance without loss of balance to improve ability to get in and out of shower safely.    Baseline 11/30: 2 sec each LE:    Time 8    Period Weeks    Status Not Met    Target Date 04/30/21      PT LONG TERM GOAL #4   Title Patient will improve FOTO score by 5% to indicate improved functional mobility with ADLs.    Baseline 11/30: 52%    Time 8    Period Weeks    Status Achieved    Target Date 04/30/21                   Plan - 04/17/21 1317     Clinical Impression Statement Patient motivated and participated well within session. Patient instructed in outcome measures to address progress towards goals. She does exhibit improvement in LE strength. Although she is still weak in BLE hip/knee and  ankle. Patient continues to have pain in BLE intermittently and is unsure if pain is related to LE exercise and/or statin drugs. While she didn't exhibit a significant  improvement in distance on 6 min walk she does exhibit improvement in cardiovascular conditioning with less jump in BP with added exercise. She does exhibit significant improvement in sit<>Stand ability. She continues to have difficulty with higher level balance tasks being able to hold SLS for only 2 sec. Patient would benefit from additional skilled PT intervention to improve LE strength, balance and mobility.    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, HTN (somewhat controlled), GERD, burning in feet (possible neuropathy),PVD,  plantar fasciitis, melanoma removed (10 years ago), hypercholesterolemia, anxiety, TIA (16 years ago)    Examination-Activity Limitations Bend;Lift;Locomotion Level;Squat;Stairs;Stand;Transfers    Examination-Participation Restrictions Cleaning;Community Activity;Shop;Volunteer;Yard Work    Stability/Clinical Decision Making Stable/Uncomplicated    Rehab Potential Good    PT Frequency 2x / week  PT Duration 8 weeks    PT Treatment/Interventions Cryotherapy;Moist Heat;Gait training;Stair training;Functional mobility training;Therapeutic activities;Therapeutic exercise;Balance training;Neuromuscular re-education;Patient/family education;Energy conservation    PT Next Visit Plan further assess balance, initiate HEP for UE/LE strengthening    PT Home Exercise Plan will address next session    Consulted and Agree with Plan of Care Patient             Patient will benefit from skilled therapeutic intervention in order to improve the following deficits and impairments:  Decreased endurance, Decreased mobility, Difficulty walking, Increased edema, Cardiopulmonary status limiting activity, Decreased activity tolerance, Decreased strength  Visit Diagnosis: Muscle weakness (generalized)  Unsteadiness on feet  Difficulty in walking, not elsewhere  classified  Other abnormalities of gait and mobility  Other lack of coordination     Problem List There are no problems to display for this patient.   Adore Kithcart, PT, DPT 04/17/2021, 1:27 PM  [place] MAIN [facility] SERVICES [address] [place], [place], [address] Phone: [phone]   Fax:  [phone]  Name: Genese Quebedeaux MRN: [id number] Date of Birth: Dec 24, 1940

## 2021-04-23 ENCOUNTER — Ambulatory Visit: Payer: Medicare Other | Attending: Internal Medicine

## 2021-04-23 ENCOUNTER — Other Ambulatory Visit: Payer: Self-pay

## 2021-04-23 DIAGNOSIS — R2681 Unsteadiness on feet: Secondary | ICD-10-CM | POA: Diagnosis present

## 2021-04-23 DIAGNOSIS — R278 Other lack of coordination: Secondary | ICD-10-CM | POA: Diagnosis present

## 2021-04-23 DIAGNOSIS — M6281 Muscle weakness (generalized): Secondary | ICD-10-CM | POA: Insufficient documentation

## 2021-04-23 DIAGNOSIS — R262 Difficulty in walking, not elsewhere classified: Secondary | ICD-10-CM | POA: Insufficient documentation

## 2021-04-23 DIAGNOSIS — R2689 Other abnormalities of gait and mobility: Secondary | ICD-10-CM | POA: Diagnosis present

## 2021-04-23 NOTE — Therapy (Signed)
[place] MAIN [facility] SERVICES [address] [place], [place], [address] Phone: [phone]   Fax:  [phone]  Physical Therapy Treatment  Patient Details  Name: Laura Cole MRN: [id number] Date of Birth: July 12, 1940 Referring Provider (PT): Dr. Harrel Lemon   Encounter Date: 04/23/2021   PT End of Session - 04/23/21 1105     Visit Number 9    Number of Visits 17    Date for PT Re-Evaluation 04/30/21    Authorization Type Medicare, start of care 03/05/21    PT Start Time 1100    PT Stop Time 1144    PT Time Calculation (min) 44 min    Equipment Utilized During Treatment Gait belt    Activity Tolerance Patient tolerated treatment well    Behavior During Therapy WFL for tasks assessed/performed             Past Medical History:  Diagnosis Date   Bradycardia    Cancer ([place])    skin cancer   Coronary artery disease    Diverticulitis    GERD (gastroesophageal reflux disease)    Heart murmur    Hypertension    Hypothyroidism    Osteoarthritis    Peripheral vascular disease ([place])    TIA (transient ischemic attack)     Past Surgical History:  Procedure Laterality Date   ABDOMINAL HYSTERECTOMY     BACK SURGERY     CHOLECYSTECTOMY     COLON SURGERY     COLONOSCOPY WITH PROPOFOL N/A 12/05/2019   Procedure: COLONOSCOPY WITH PROPOFOL;  Surgeon: Lesly Rubenstein, MD;  Location: [facility] ENDOSCOPY;  Service: Endoscopy;  Laterality: N/A;   ESOPHAGOGASTRODUODENOSCOPY (EGD) WITH PROPOFOL N/A 12/05/2019   Procedure: ESOPHAGOGASTRODUODENOSCOPY (EGD) WITH PROPOFOL;  Surgeon: Lesly Rubenstein, MD;  Location: [facility] ENDOSCOPY;  Service: Endoscopy;  Laterality: N/A;   EYE SURGERY     RECTOCELE REPAIR     RECTOCELE REPAIR     REPLACEMENT TOTAL KNEE Bilateral    TKRX2 Bilateral    WRIST SURGERY      There were no vitals filed for this visit.   Subjective Assessment - 04/23/21 1103     Subjective Patient reports she feels dehydrated and  dizzy for PT session. Got up this morning to pack for her move next week.    Pertinent History 80 yo Female reports increased weakness in BLE. She reports she is able to walk normally on level surface but when she walks outside on unlevel surface, she has to use a cane and her left foot drags. She reports doing well until COVID started and then when COVID Started she stayed home and sat most of  the time. She does believe that inactivity and older age has contributed to weakness. She also reports weakness in her hands and states she will drop things especially if its light. She denies any tingling but has had some skin changes in her hands as a result of repeated antibiotic use which does affect sensitivity in hands. 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 She denies any recent falls.    Limitations Standing;Walking    How long can you sit comfortably? 1 hour, does get up and move around    How long can you stand comfortably? only able to stand flat footed for 5 min, if she is able to move around she does okay;    How long can you walk comfortably? about 750 feet with increased shortness of  breath; Pt used to walk around [employer] (about 2-3 laps) daily but states 6 months ago she got bronchitis and it lasted a while and she hasn't been able to start back.    Patient Stated Goals "Walk farther than I can currently walk. Be able to do things where I don't hurt. I want to be self sufficient as long as I can."    Currently in Pain? No/denies                Patient reports she feels dehydrated and dizzy for PT session. Got up this morning to pack for her move next week.     TREATMENT: BP 127/52 at start of session;     TherEx Instructed patient in LE strengthening:   Standing: Modified squat with BUE support  BLE heel raise x10 reps, minimal difficulty reported   Standing  with 2.5# ankle weight around BLE: -hip abduction x10 reps each LE;  x 2 sets each LE -hip flexion march x10 reps each LE -hip extension x10 reps each LE  Patient required intermittent rail assist; She required min VCS for proper positioning and exercise technique;   Seated with # 2.5 ankle weights  -Seated marches with upright posture, back away from back of chair for abdominal/trunk activation/stabilization, 10x each LE -Seated LAQ with 3 second holds, 10x each LE, cueing for muscle activation and sequencing for neutral alignment -Seated IR/ER with cueing for stabilizing knee placement with lateral foot movement for optimal muscle recruitment, 10x each LE -heel raise 10x bilateral LE  Seated hamstring isometric on dynadisc 10x 3 second holds  10x STS with arms crossed Hamstring lengthening stretch 30 seconds each LE    Patient does require min Vcs for proper exercise technique and positioning. She denies any increase in pain with advanced exercise;  Neuro Re-ed Airex pad:  Modified tandem with one foot on each square 2x30 seconds each LE placement.   SLS with BUE support 30 seconds x; 2 trials each LE  Bosu ball:  Round side up : modified forward lunge with SUE support 12x each LE Round side up: lateral modified lunge with BUE support 12x each side Flat side up: static stand 30 seconds    Pt educated throughout session about proper posture and technique with exercises. Improved exercise technique, movement at target joints, use of target muscles after min to mod verbal, visual, tactile cues. She denies any increase in pain at end of session.      Patient presents with high motivation throughout session despite fatigue and dizziness from dehydration. Water provided throughout session. Strengthening intervention tolerated increased weight for interventions. Intermitted bilateral knee pain reported throughout session. Patient would benefit from additional skilled PT intervention to improve LE strength, balance and  mobility.                 PT Education - 04/23/21 1104     Education Details exercise technique, body mechanics    Person(s) Educated Patient    Methods Explanation;Demonstration;Tactile cues;Verbal cues    Comprehension Verbalized understanding;Returned demonstration;Verbal cues required;Tactile cues required              PT Short Term Goals - 04/17/21 1148       PT SHORT TERM GOAL #1   Title Patient will be adherent to HEP at least 3x a week to improve functional strength and balance for better safety at home.    Baseline 11/30: 4-5 days    Time 4  Period Weeks    Status Achieved    Target Date 04/02/21      PT SHORT TERM GOAL #2   Title Patient (> 42 years old) will complete five times sit to stand test in < 15 seconds indicating an increased LE strength and improved balance.    Baseline 11/30: 15.01 sec with arms across chest    Time 4    Period Weeks    Status Partially Met    Target Date 04/02/21               PT Long Term Goals - 04/17/21 1150       PT LONG TERM GOAL #1   Title Patient will increase six minute walk test distance to >1300 for progression to community ambulator and improve gait ability closer to age group norms.    Baseline 11/30: 1085 feet    Time 8    Period Weeks    Status Not Met    Target Date 04/30/21      PT LONG TERM GOAL #2   Title Patient will increase BLE gross strength to 4+/5 as to improve functional strength for independent gait, increased standing tolerance and increased ADL ability.    Baseline 11/30: see flowsheet    Time 8    Period Weeks    Status Partially Met    Target Date 04/30/21      PT LONG TERM GOAL #3   Title Patient will tolerate 5 seconds of single leg stance without loss of balance to improve ability to get in and out of shower safely.    Baseline 11/30: 2 sec each LE:    Time 8    Period Weeks    Status Not Met    Target Date 04/30/21      PT LONG TERM GOAL #4   Title Patient  will improve FOTO score by 5% to indicate improved functional mobility with ADLs.    Baseline 11/30: 52%    Time 8    Period Weeks    Status Achieved    Target Date 04/30/21                   Plan - 04/23/21 1133     Clinical Impression Statement Patient presents with high motivation throughout session despite fatigue and dizziness from dehydration. Water provided throughout session. Strengthening intervention tolerated increased weight for interventions. Intermitted bilateral knee pain reported throughout session. Patient would benefit from additional skilled PT intervention to improve LE strength, balance and mobility.    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, HTN (somewhat controlled), GERD, burning in feet (possible neuropathy),PVD,  plantar fasciitis, melanoma removed (10 years ago), hypercholesterolemia, anxiety, TIA (16 years ago)    Examination-Activity Limitations Bend;Lift;Locomotion Level;Squat;Stairs;Stand;Transfers    Examination-Participation Restrictions Cleaning;Community Activity;Shop;Volunteer;Yard Work    Stability/Clinical Decision Making Stable/Uncomplicated    Rehab Potential Good    PT Frequency 2x / week    PT Duration 8 weeks    PT Treatment/Interventions Cryotherapy;Moist Heat;Gait training;Stair training;Functional mobility training;Therapeutic activities;Therapeutic exercise;Balance training;Neuromuscular re-education;Patient/family education;Energy conservation    PT Next Visit Plan further assess balance, initiate HEP for UE/LE strengthening    PT Home Exercise Plan will address next session    Consulted and Agree with Plan of Care Patient             Patient will benefit from skilled therapeutic intervention in  order to improve the following deficits and impairments:  Decreased endurance, Decreased mobility, Difficulty  walking, Increased edema, Cardiopulmonary status limiting activity, Decreased activity tolerance, Decreased strength  Visit Diagnosis: Muscle weakness (generalized)  Unsteadiness on feet     Problem List There are no problems to display for this patient.   Janna Arch, PT, DPT  04/23/2021, 11:45 AM  [place] MAIN [facility] SERVICES [address] [place], [place], [address] Phone: [phone]   Fax:  [phone]  Name: Evon Dejarnett MRN: [id number] Date of Birth: 10-16-1940

## 2021-04-26 ENCOUNTER — Ambulatory Visit: Payer: Medicare Other | Admitting: Physical Therapy

## 2021-04-29 ENCOUNTER — Encounter: Payer: Self-pay | Admitting: Physical Therapy

## 2021-04-29 ENCOUNTER — Other Ambulatory Visit: Payer: Self-pay

## 2021-04-29 ENCOUNTER — Ambulatory Visit: Payer: Medicare Other | Admitting: Physical Therapy

## 2021-04-29 VITALS — BP 107/49 | HR 50

## 2021-04-29 DIAGNOSIS — R2681 Unsteadiness on feet: Secondary | ICD-10-CM

## 2021-04-29 DIAGNOSIS — M6281 Muscle weakness (generalized): Secondary | ICD-10-CM

## 2021-04-29 DIAGNOSIS — R262 Difficulty in walking, not elsewhere classified: Secondary | ICD-10-CM

## 2021-04-29 DIAGNOSIS — R278 Other lack of coordination: Secondary | ICD-10-CM

## 2021-04-29 DIAGNOSIS — R2689 Other abnormalities of gait and mobility: Secondary | ICD-10-CM

## 2021-04-29 NOTE — Patient Instructions (Addendum)
Vitals in Rehab Start of session after exercise after rest  04/29/2021 107/49, HR 50 119/44, HR 48 104/48, HR 50  04/23/2021 127/52    04/10/2021 115/49 113/54, HR 53 123/53, HR 53 (after gait)  04/08/2021 137/56 HR 53    04/03/2021 136/52     03/20/2021 129/60, HR 55 113/46   03/05/2021 120/54, HR 54 after walking 1000 feet:165/45, HR 60    Access Code: [email or web address] URL: [email or web address] Date: 04/29/2021 Prepared by: Blanche East  Exercises Standing March with Counter Support - 1 x daily - 7 x weekly - 2 sets - 10 reps - 5 hold Standing Tandem Balance with Counter Support - 1 x daily - 7 x weekly - 2 sets - 2 reps - 30 hold Single Leg Stance with Support - 1 x daily - 7 x weekly - 2 sets - 2 reps - 30 hold Mini Squat with Counter Support - 1 x daily - 7 x weekly - 2 sets - 10 reps - 5 hold Seated Toe Raise - 1 x daily - 7 x weekly - 2 sets - 10 reps Standing Heel Raise with Support - 1 x daily - 7 x weekly - 1 sets - 15 reps Standing Hip Abduction with Counter Support - 1 x daily - 7 x weekly - 1 sets - 15 reps Standing Hip Extension with Counter Support - 1 x daily - 7 x weekly - 1 sets - 15 reps Side Stepping with Counter Support - 1 x daily - 7 x weekly - 1 sets - 15 reps

## 2021-04-29 NOTE — Therapy (Signed)
[place] MAIN [facility] SERVICES [address] [place], [place], [address] Phone: [phone]   Fax:  [phone]  Physical Therapy Treatment  Patient Details  Name: Laura Cole MRN: [id number] Date of Birth: Mar 29, 1941 Referring Provider (PT): Dr. Harrel Lemon   Encounter Date: 04/29/2021   PT End of Session - 04/29/21 1503     Visit Number 10    Number of Visits 17    Date for PT Re-Evaluation 04/30/21    Authorization Type Medicare, start of care 03/05/21    PT Start Time 1105    PT Stop Time 1147    PT Time Calculation (min) 42 min    Equipment Utilized During Treatment Gait belt    Activity Tolerance Patient tolerated treatment well    Behavior During Therapy WFL for tasks assessed/performed             Past Medical History:  Diagnosis Date   Bradycardia    Cancer ([place])    skin cancer   Coronary artery disease    Diverticulitis    GERD (gastroesophageal reflux disease)    Heart murmur    Hypertension    Hypothyroidism    Osteoarthritis    Peripheral vascular disease ([place])    TIA (transient ischemic attack)     Past Surgical History:  Procedure Laterality Date   ABDOMINAL HYSTERECTOMY     BACK SURGERY     CHOLECYSTECTOMY     COLON SURGERY     COLONOSCOPY WITH PROPOFOL N/A 12/05/2019   Procedure: COLONOSCOPY WITH PROPOFOL;  Surgeon: Lesly Rubenstein, MD;  Location: [facility] ENDOSCOPY;  Service: Endoscopy;  Laterality: N/A;   ESOPHAGOGASTRODUODENOSCOPY (EGD) WITH PROPOFOL N/A 12/05/2019   Procedure: ESOPHAGOGASTRODUODENOSCOPY (EGD) WITH PROPOFOL;  Surgeon: Lesly Rubenstein, MD;  Location: [facility] ENDOSCOPY;  Service: Endoscopy;  Laterality: N/A;   EYE SURGERY     RECTOCELE REPAIR     RECTOCELE REPAIR     REPLACEMENT TOTAL KNEE Bilateral    TKRX2 Bilateral    WRIST SURGERY      Vitals:   04/29/21 1110  BP: (!) 107/49  Pulse: (!) 50     Subjective Assessment - 04/29/21 1109     Subjective Patient denies any  new falls. She reports her BP has been better but she started cutting her nighttime meds in 1/2. Patient reports continued intermittent pain in feet, none currently; Reports some nausea today likely from IBS;    Pertinent History 80 yo Female reports increased weakness in BLE. She reports she is able to walk normally on level surface but when she walks outside on unlevel surface, she has to use a cane and her left foot drags. She reports doing well until COVID started and then when COVID Started she stayed home and sat most of  the time. She does believe that inactivity and older age has contributed to weakness. She also reports weakness in her hands and states she will drop things especially if its light. She denies any tingling but has had some skin changes in her hands as a result of repeated antibiotic use which does affect sensitivity in hands. 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 She denies any recent falls.    Limitations Standing;Walking    How long can you sit comfortably? 1 hour, does get up and move around    How long can you stand comfortably? only able to stand flat footed for 5 min, if she is  able to move around she does okay;    How long can you walk comfortably? about 750 feet with increased shortness of breath; Pt used to walk around [employer] (about 2-3 laps) daily but states 6 months ago she got bronchitis and it lasted a while and she hasn't been able to start back.    Patient Stated Goals "Walk farther than I can currently walk. Be able to do things where I don't hurt. I want to be self sufficient as long as I can."    Currently in Pain? No/denies    Multiple Pain Sites No                 Patient reports she feels dehydrated and dizzy for PT session. Got up this morning to pack for her move next week.        TREATMENT:     TherEx Instructed patient in LE strengthening:    Standing  with 2.5# ankle weight around  BLE: -hip abduction x15 reps each LE; x 2 sets each LE -hip flexion march x15 reps each LE -hip extension x15 reps each LE   Patient required intermittent rail assist; She required min VCS for proper positioning and exercise technique;    Seated with # 2.5 ankle weights  -Seated LAQ with 3 second holds, 10x each LE, cueing for muscle activation and sequencing for neutral alignment  Vitals after standing strengthening exercise: BP 119/44, HR 48, after rest break, BP 104/48 HR 50   Patient does require min Vcs for proper exercise technique and positioning. She denies any increase in pain with advanced exercise;   Pt educated throughout session about proper posture and technique with exercises. Improved exercise technique, movement at target joints, use of target muscles after min to mod verbal, visual, tactile cues.  Advanced HEP- see patient instructions. She is unable to tolerate resisted exercise with tband due to discomfort from band. Therefore instructed patient in exercise without resistance but increase repetition for better strengthening;                              PT Education - 04/29/21 1503     Education Details exercise technique/HEP    Person(s) Educated Patient    Methods Explanation;Verbal cues    Comprehension Verbalized understanding;Returned demonstration;Verbal cues required;Need further instruction              PT Short Term Goals - 04/17/21 1148       PT SHORT TERM GOAL #1   Title Patient will be adherent to HEP at least 3x a week to improve functional strength and balance for better safety at home.    Baseline 11/30: 4-5 days    Time 4    Period Weeks    Status Achieved    Target Date 04/02/21      PT SHORT TERM GOAL #2   Title Patient (> 39 years old) will complete five times sit to stand test in < 15 seconds indicating an increased LE strength and improved balance.    Baseline 11/30: 15.01 sec with arms across chest    Time  4    Period Weeks    Status Partially Met    Target Date 04/02/21               PT Long Term Goals - 04/17/21 1150       PT LONG TERM GOAL #1   Title  Patient will increase six minute walk test distance to >1300 for progression to community ambulator and improve gait ability closer to age group norms.    Baseline 11/30: 1085 feet    Time 8    Period Weeks    Status Not Met    Target Date 04/30/21      PT LONG TERM GOAL #2   Title Patient will increase BLE gross strength to 4+/5 as to improve functional strength for independent gait, increased standing tolerance and increased ADL ability.    Baseline 11/30: see flowsheet    Time 8    Period Weeks    Status Partially Met    Target Date 04/30/21      PT LONG TERM GOAL #3   Title Patient will tolerate 5 seconds of single leg stance without loss of balance to improve ability to get in and out of shower safely.    Baseline 11/30: 2 sec each LE:    Time 8    Period Weeks    Status Not Met    Target Date 04/30/21      PT LONG TERM GOAL #4   Title Patient will improve FOTO score by 5% to indicate improved functional mobility with ADLs.    Baseline 11/30: 52%    Time 8    Period Weeks    Status Achieved    Target Date 04/30/21                   Plan - 04/29/21 1503     Clinical Impression Statement Patient motivated and participated fair within session. She does exhibit low BP at start of session. Instructed patient in LE strengthening exercise. her BP continued to be on lower side. provided patient with written history of BP, trend does show general decline in BP over last few weeks. See patient instructions. She plans to reach out to MD for follow up for possible medication adjustement. Patient also educated in HEP for progression. She is unable to tolerate resisted band exercise due to soreness on legs. therefore, instructed patient to do exercise without resistance but increase repetititon for strengthening.  Patient is moving later this week. She would benefit from additional skilled PT Intervention to improve strength, balance and mobility;    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, HTN (somewhat controlled), GERD, burning in feet (possible neuropathy),PVD,  plantar fasciitis, melanoma removed (10 years ago), hypercholesterolemia, anxiety, TIA (16 years ago)    Examination-Activity Limitations Bend;Lift;Locomotion Level;Squat;Stairs;Stand;Transfers    Examination-Participation Restrictions Cleaning;Community Activity;Shop;Volunteer;Yard Work    Stability/Clinical Decision Making Stable/Uncomplicated    Rehab Potential Good    PT Frequency 2x / week    PT Duration 8 weeks    PT Treatment/Interventions Cryotherapy;Moist Heat;Gait training;Stair training;Functional mobility training;Therapeutic activities;Therapeutic exercise;Balance training;Neuromuscular re-education;Patient/family education;Energy conservation    PT Next Visit Plan further assess balance, initiate HEP for UE/LE strengthening    PT Home Exercise Plan will address next session    Consulted and Agree with Plan of Care Patient             Patient will benefit from skilled therapeutic intervention in order to improve the following deficits and impairments:  Decreased endurance, Decreased mobility, Difficulty walking, Increased edema, Cardiopulmonary status limiting activity, Decreased activity tolerance, Decreased strength  Visit Diagnosis: Muscle weakness (generalized)  Unsteadiness on feet  Difficulty in walking, not elsewhere classified  Other abnormalities of  gait and mobility  Other lack of coordination     Problem List There are no problems to display for this patient.   Cheyne Boulden, PT, DPT 04/29/2021, 3:08 PM  [place] MAIN [facility]  SERVICES [address] [place], [place], [address] Phone: [phone]   Fax:  [phone]  Name: Adreana Coull MRN: [id number] Date of Birth: 1941-03-19

## 2021-05-07 ENCOUNTER — Emergency Department: Payer: Medicare Other

## 2021-05-07 ENCOUNTER — Other Ambulatory Visit: Payer: Self-pay

## 2021-05-07 ENCOUNTER — Encounter: Payer: Self-pay | Admitting: Emergency Medicine

## 2021-05-07 ENCOUNTER — Emergency Department
Admission: EM | Admit: 2021-05-07 | Discharge: 2021-05-07 | Disposition: A | Discharge status: Home / Self Care | Payer: Medicare Other | Attending: Emergency Medicine | Admitting: Emergency Medicine

## 2021-05-07 DIAGNOSIS — S24109A Unspecified injury at unspecified level of thoracic spinal cord, initial encounter: Secondary | ICD-10-CM | POA: Diagnosis present

## 2021-05-07 DIAGNOSIS — X58XXXA Exposure to other specified factors, initial encounter: Secondary | ICD-10-CM | POA: Diagnosis not present

## 2021-05-07 DIAGNOSIS — Z96653 Presence of artificial knee joint, bilateral: Secondary | ICD-10-CM | POA: Insufficient documentation

## 2021-05-07 DIAGNOSIS — Z79899 Other long term (current) drug therapy: Secondary | ICD-10-CM | POA: Diagnosis not present

## 2021-05-07 DIAGNOSIS — Z85828 Personal history of other malignant neoplasm of skin: Secondary | ICD-10-CM | POA: Insufficient documentation

## 2021-05-07 DIAGNOSIS — E039 Hypothyroidism, unspecified: Secondary | ICD-10-CM | POA: Diagnosis not present

## 2021-05-07 DIAGNOSIS — I2510 Atherosclerotic heart disease of native coronary artery without angina pectoris: Secondary | ICD-10-CM | POA: Insufficient documentation

## 2021-05-07 DIAGNOSIS — I10 Essential (primary) hypertension: Secondary | ICD-10-CM | POA: Insufficient documentation

## 2021-05-07 DIAGNOSIS — I119 Hypertensive heart disease without heart failure: Secondary | ICD-10-CM | POA: Insufficient documentation

## 2021-05-07 DIAGNOSIS — Z7982 Long term (current) use of aspirin: Secondary | ICD-10-CM | POA: Diagnosis not present

## 2021-05-07 DIAGNOSIS — S22000A Wedge compression fracture of unspecified thoracic vertebra, initial encounter for closed fracture: Secondary | ICD-10-CM | POA: Diagnosis not present

## 2021-05-07 IMAGING — CR DG THORACIC SPINE 2V
3 series · 3 of 3 positions shown · non-contrast
Comparison: Chest radiographs [DATE], abdominal CT [DATE]
and chest CT [DATE].

CLINICAL DATA: Mid to low back pain extending into both hips since
yesterday. No acute injury.

EXAM:
THORACIC SPINE 2 VIEWS

[t-spine ap]
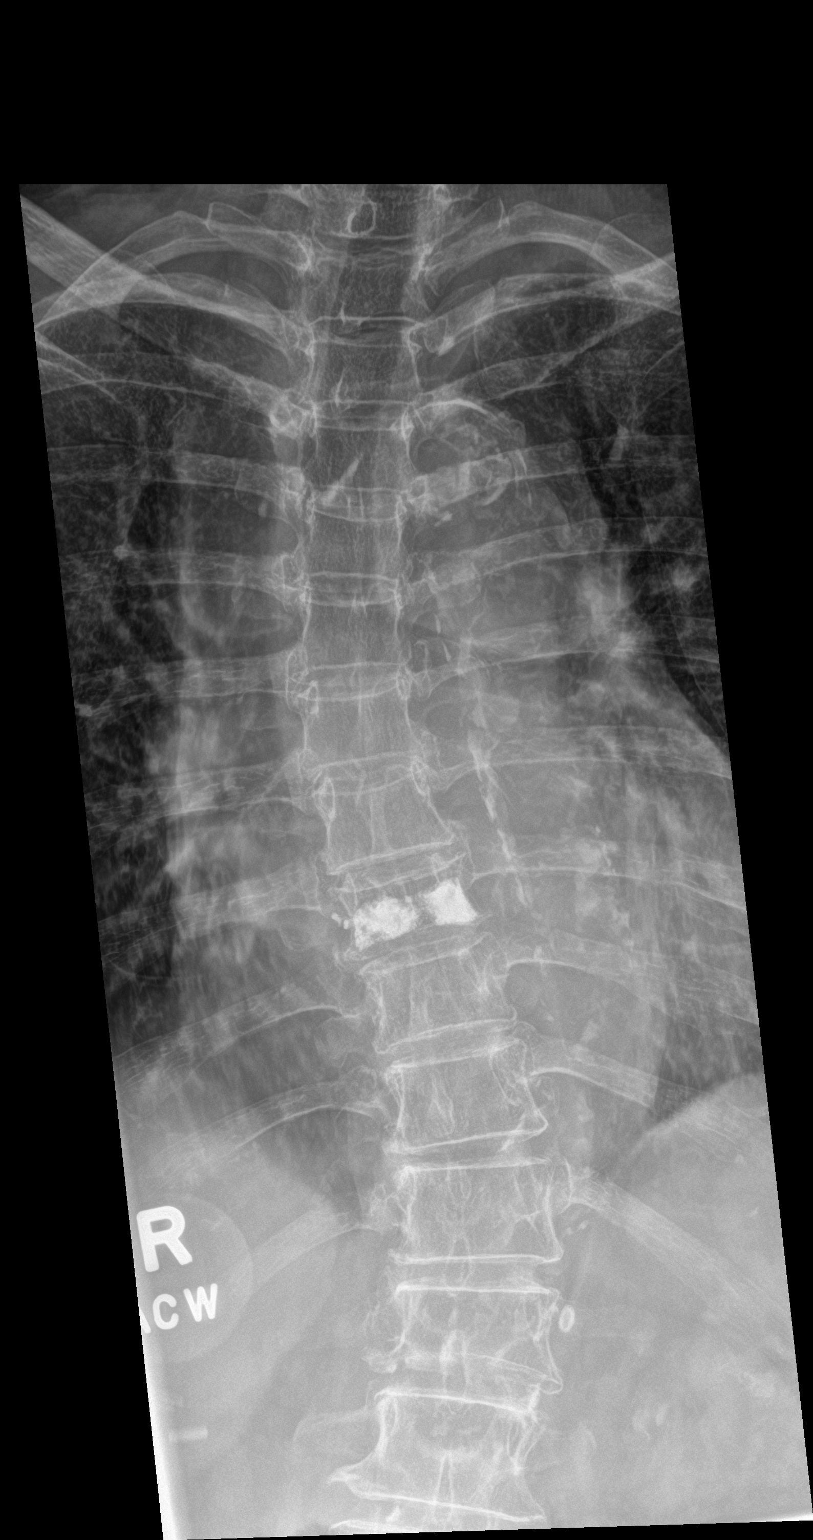

[t-spine lat]
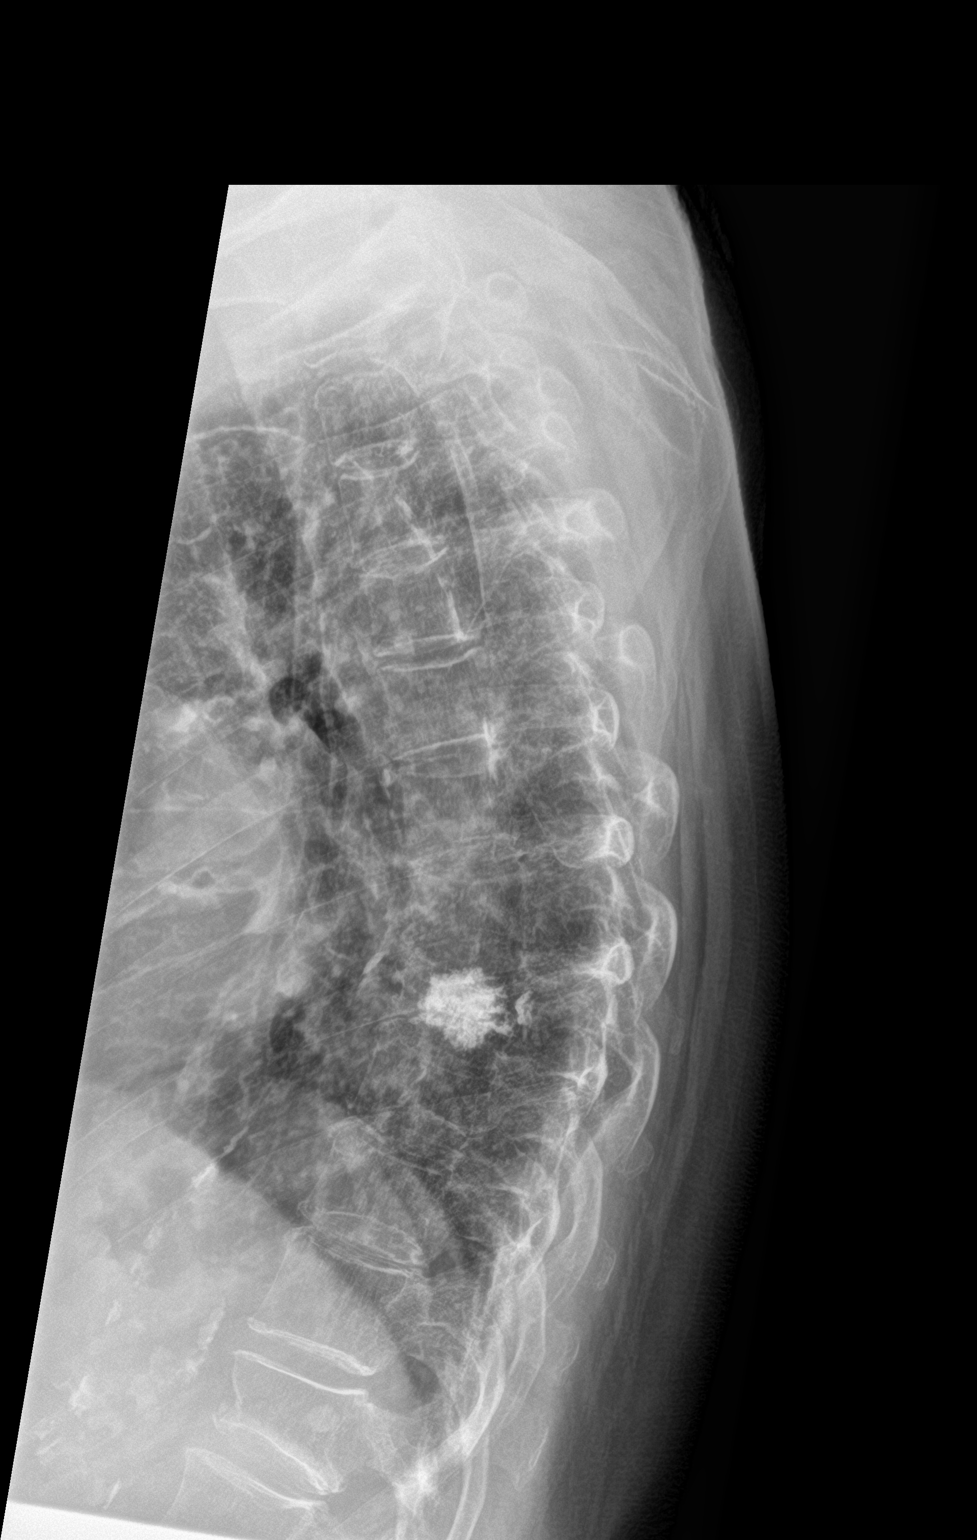

[t-spine swimmers]
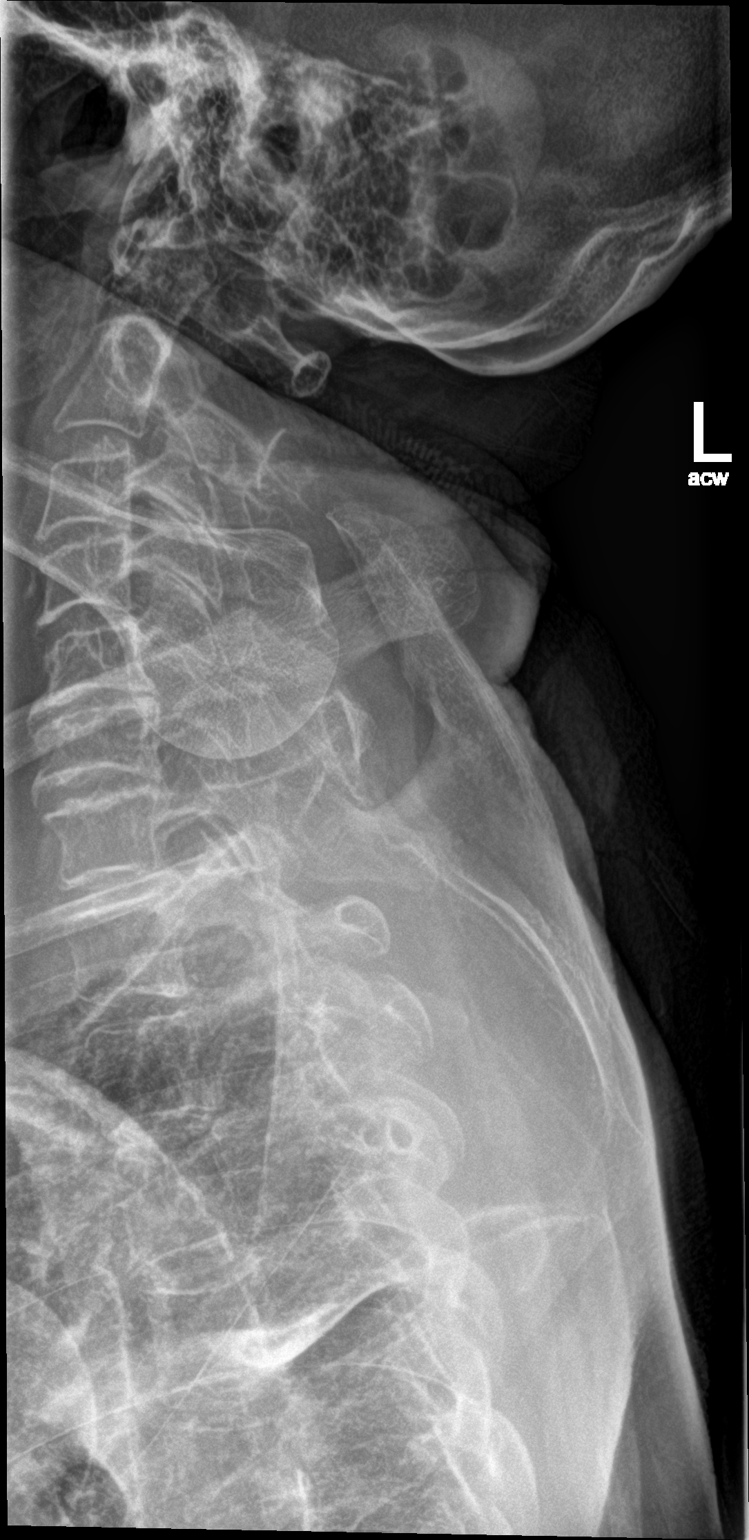

[3 of 3 positions shown; findings below may reference images not displayed]

FINDINGS: There are small ribs at T12. There is a biconvex thoracolumbar
scoliosis, convex to the left at T11. Patient is status post spinal
augmentation at T8. There is a new inferior endplate compression
fracture at T12 which is likely acute. This results in approximately
20% loss of vertebral body height. No osseous retropulsion
identified. No other acute fractures are identified. Mild lower
cervical spondylosis noted.
IMPRESSION: New inferior endplate compression fracture at T12, likely acute.
Previous T8 spinal augmentation.

## 2021-05-07 IMAGING — CR DG LUMBAR SPINE 2-3V
3 series · 3 of 3 positions shown · non-contrast
Comparison: Chest radiographs [DATE].  Abdominal CT [DATE].

CLINICAL DATA: Mid to low back pain extending into both hips since
yesterday. No acute injury.

EXAM:
LUMBAR SPINE - 2-3 VIEW

[l-spine ap]
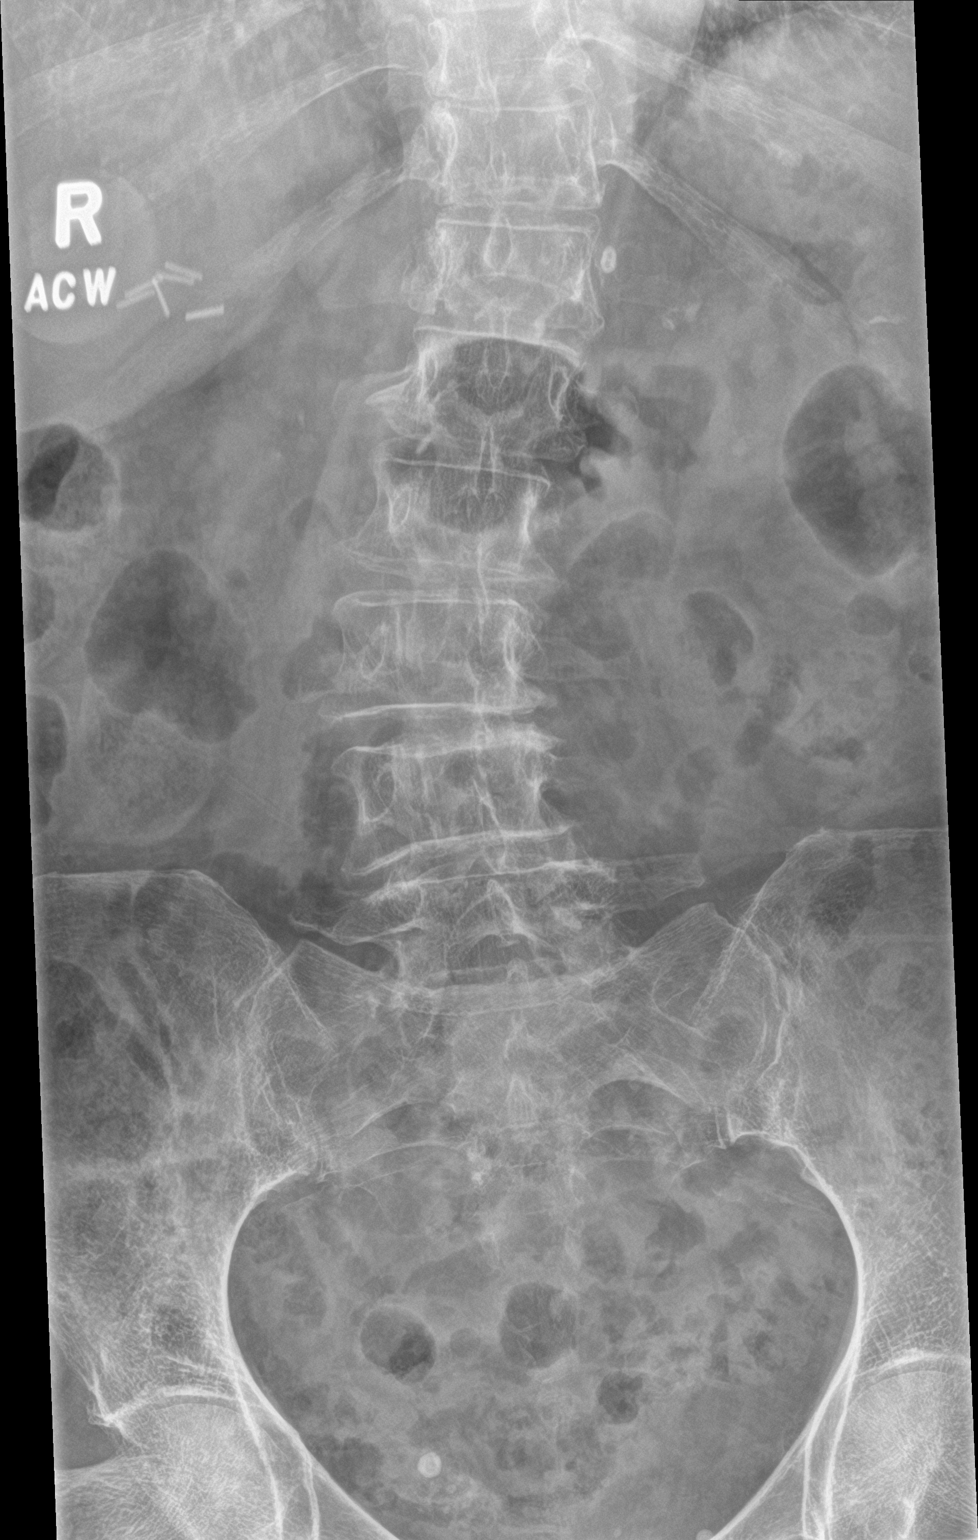

[l-spine spot]
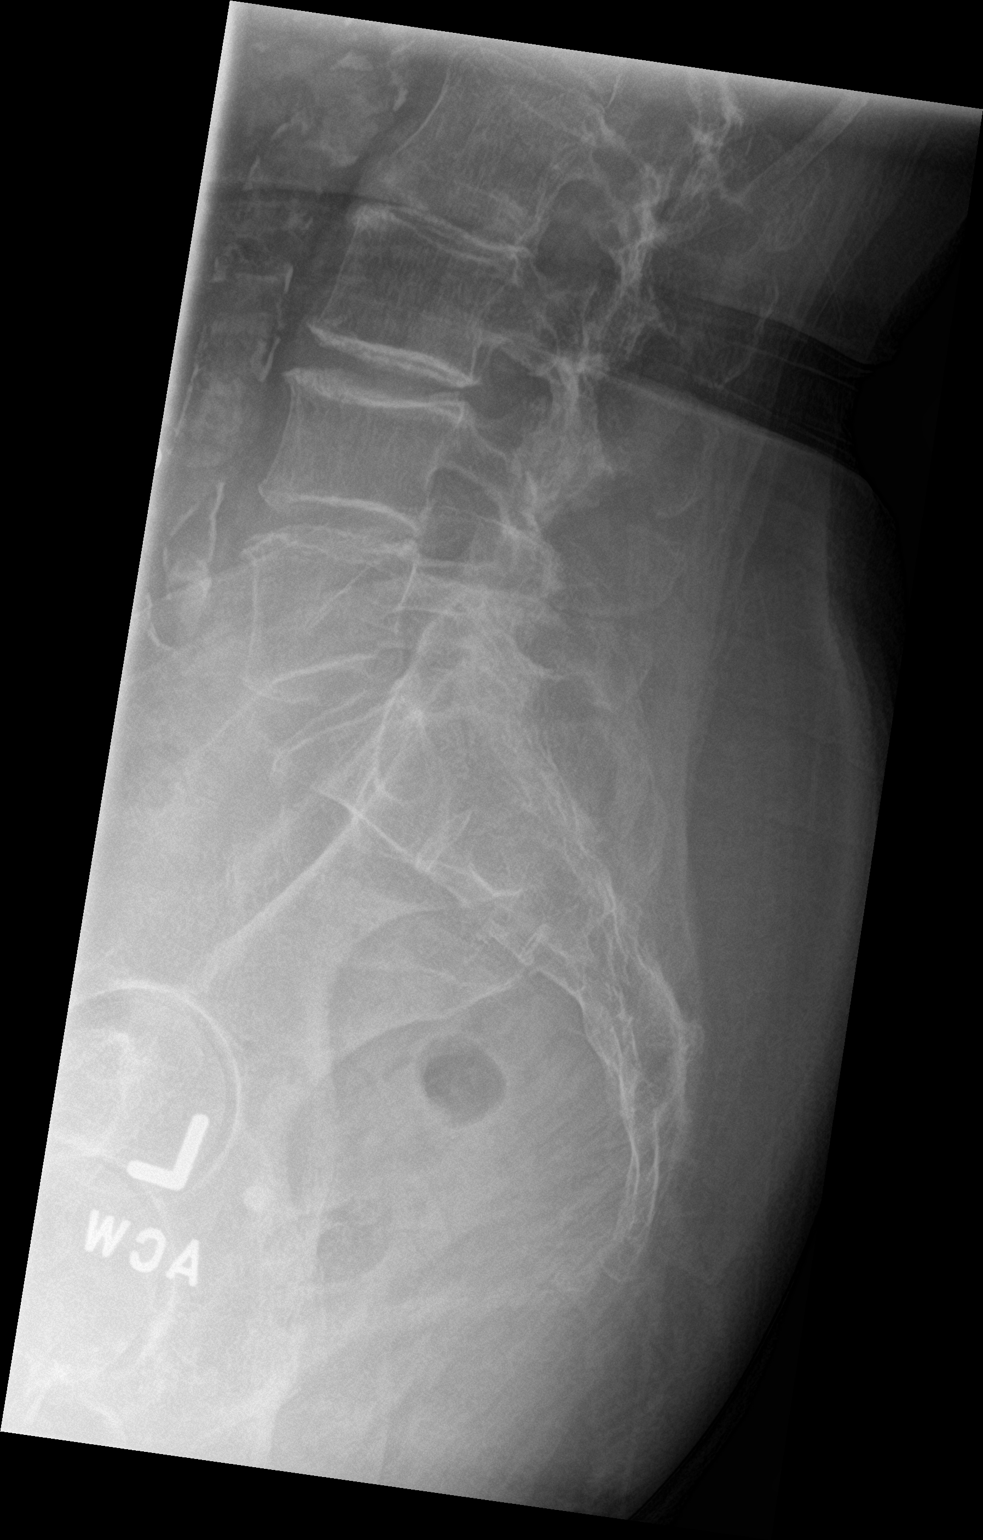

[l-spine lat]
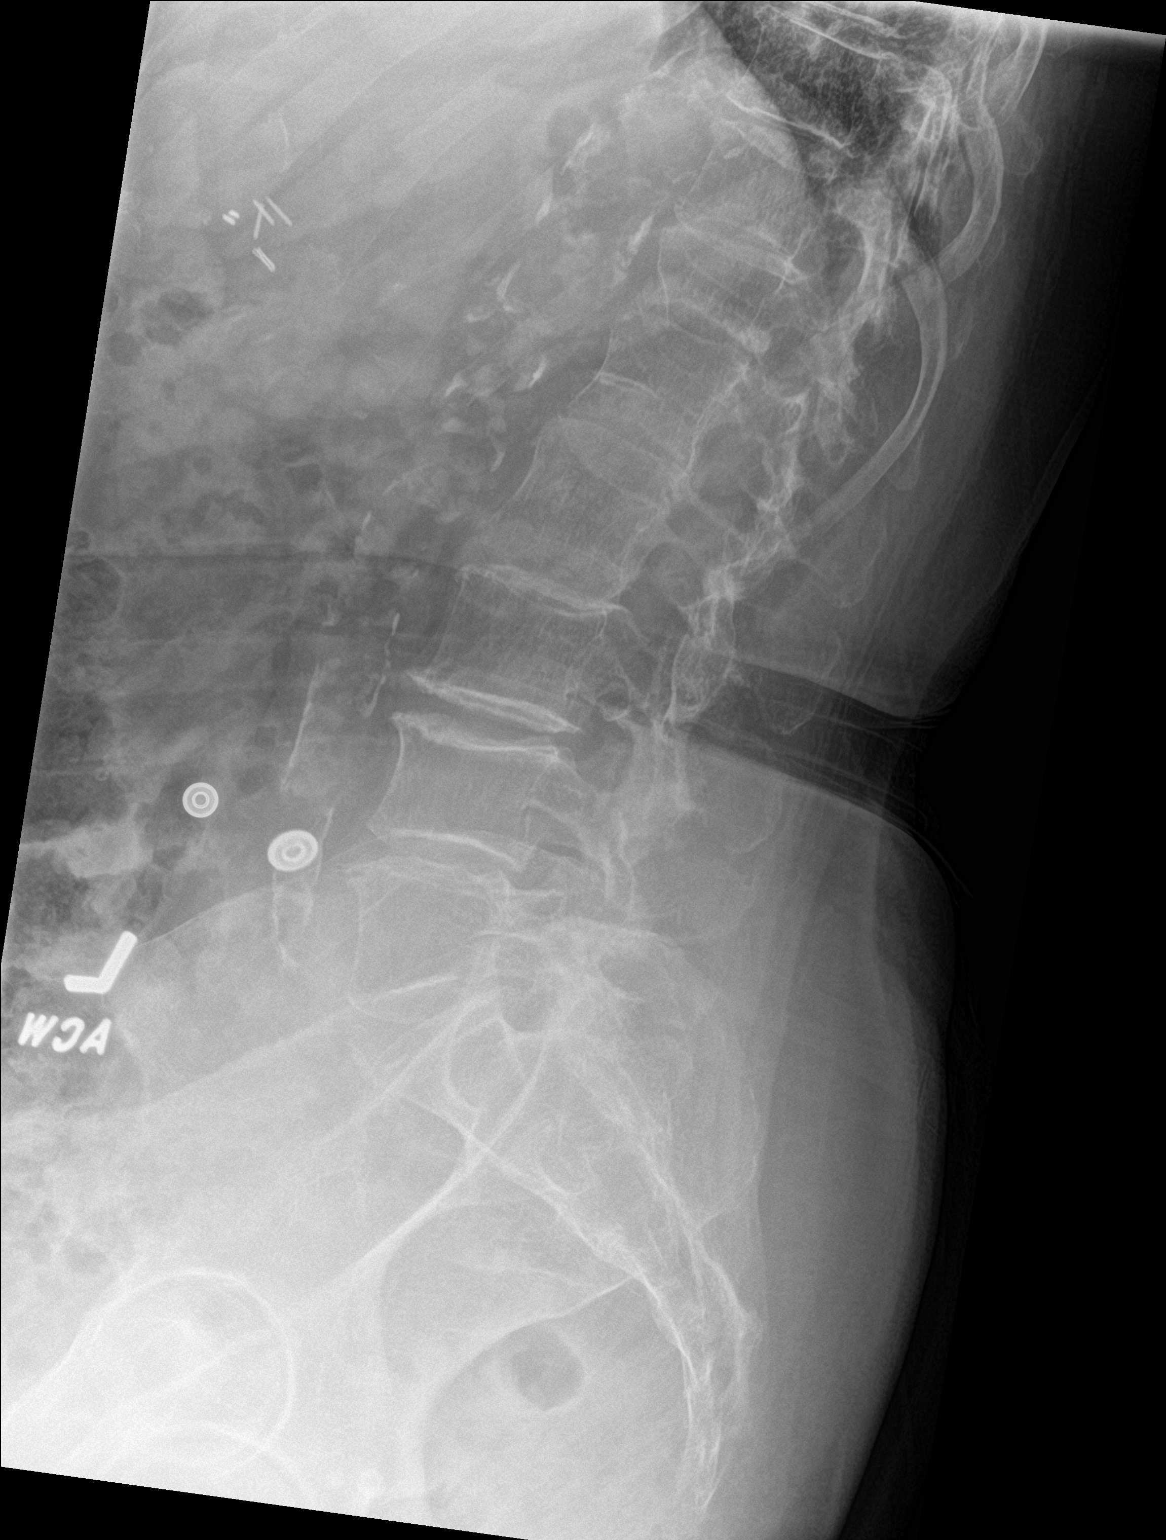

[3 of 3 positions shown; findings below may reference images not displayed]

FINDINGS: There are small ribs at T12 and 5 lumbar type vertebral bodies.
There is a convex right scoliosis centered at L3. Acute-appearing
inferior endplate compression deformity at T12 is better seen on the
accompanying thoracic spine radiographs. No evidence of acute lumbar
spine fracture. A mild chronic superior endplate compression
deformity at L4 is stable. There is multilevel lumbar spondylosis
with facet hypertrophy. Diffuse aortoiliac atherosclerosis noted.
IMPRESSION: No evidence of acute lumbar spine injury. T12 inferior endplate
compression deformity, likely acute.

## 2021-05-07 MED ORDER — ACETAMINOPHEN 500 MG PO TABS
1000.0000 mg | ORAL_TABLET | Freq: Once | ORAL | Status: AC
  Administered 2021-05-07: 15:00:00 1000 mg via ORAL
  Filled 2021-05-07: qty 2

## 2021-05-07 MED ORDER — OXYCODONE HCL 5 MG PO CAPS: 5.0000 mg | ORAL_CAPSULE | ORAL | 0 refills | Status: DC | PRN

## 2021-05-07 MED ORDER — IBUPROFEN 400 MG PO TABS
400.0000 mg | ORAL_TABLET | Freq: Once | ORAL | Status: AC
  Administered 2021-05-07: 15:00:00 400 mg via ORAL
  Filled 2021-05-07: qty 1

## 2021-05-07 MED ORDER — OXYCODONE HCL 5 MG PO CAPS: 5.0000 mg | ORAL_CAPSULE | ORAL | 0 refills | Status: AC | PRN

## 2021-05-07 MED ORDER — LIDOCAINE 5 % EX PTCH
1.0000 | MEDICATED_PATCH | CUTANEOUS | Status: DC
  Administered 2021-05-07: 15:00:00 1 via TRANSDERMAL
  Filled 2021-05-07 (×2): qty 1

## 2021-05-07 MED ORDER — LIDOCAINE 5 % EX PTCH
1.0000 | MEDICATED_PATCH | CUTANEOUS | Status: DC
Start: 2021-05-07 — End: 2021-05-07

## 2021-05-07 NOTE — Discharge Instructions (Signed)
You have a compression fracture of your thoracic spine.  Please take Tylenol every 8 hours up to 1 g.  You can also take 400 mg of ibuprofen every 6 hours for the next 10 days.  You can take oxycodone as needed at nighttime.  Please be aware that this medication can make you dizzy, confused and should be used only for breakthrough pain.  Please follow-up with your primary care provider for a evaluation for osteoporosis.  If you develop any numbness or weakness in your legs or your pain is not controlled, please return to the emergency department.

## 2021-05-07 NOTE — ED Provider Notes (Signed)
[facility]  ____________________________________________   Event Date/Time   First MD Initiated Contact with Patient 05/07/21 1424     (approximate)  I have reviewed the triage vital signs and the nursing notes.   HISTORY  Chief Complaint Back Pain    HPI Laura Cole is a 80 y.o. female with past medical history of hypertension, GERD, peripheral vascular disease,TIA who presents with back pain.  Symptoms started acutely yesterday while she was bending down to put on her compression stockings.  She felt a sharp pain in the mid back bilaterally.  She was able to sleep last night pain-free but then when she woke up this morning her pain was worse.  She has been having difficulty getting around secondary to pain.  She denies any numbness tingling or weakness in her lower extremities.  She denies any bowel or bladder incontinence.  Does have a history of prior compression fracture treated with kyphoplasty.  She has been told she had osteopenia in the past.  She read that she was not supposed to take NSAIDs given history of TIA so has not had anything for pain.         Past Medical History:  Diagnosis Date   Bradycardia    Cancer ([place])    skin cancer   Coronary artery disease    Diverticulitis    GERD (gastroesophageal reflux disease)    Heart murmur    Hypertension    Hypothyroidism    Osteoarthritis    Peripheral vascular disease ([facility])    TIA (transient ischemic attack)     There are no problems to display for this patient.   Past Surgical History:  Procedure Laterality Date   ABDOMINAL HYSTERECTOMY     BACK SURGERY     CHOLECYSTECTOMY     COLON SURGERY     COLONOSCOPY WITH PROPOFOL N/A 12/05/2019   Procedure: COLONOSCOPY WITH PROPOFOL;  Surgeon: Lesly Rubenstein, MD;  Location: [facility] ENDOSCOPY;  Service: Endoscopy;  Laterality: N/A;   ESOPHAGOGASTRODUODENOSCOPY (EGD) WITH PROPOFOL N/A 12/05/2019   Procedure: ESOPHAGOGASTRODUODENOSCOPY (EGD)  WITH PROPOFOL;  Surgeon: Lesly Rubenstein, MD;  Location: [facility] ENDOSCOPY;  Service: Endoscopy;  Laterality: N/A;   EYE SURGERY     RECTOCELE REPAIR     RECTOCELE REPAIR     REPLACEMENT TOTAL KNEE Bilateral    TKRX2 Bilateral    WRIST SURGERY      Prior to Admission medications   Medication Sig Start Date End Date Taking? Authorizing Provider  oxycodone (OXY-IR) 5 MG capsule Take 1 capsule (5 mg total) by mouth every 4 (four) hours as needed for up to 5 days. 05/07/21 05/12/21 Yes Rada Hay, MD  amLODipine (NORVASC) 5 MG tablet  10/26/18   [provider]  aspirin 81 MG chewable tablet Chew by mouth. Patient not taking: Reported on 12/05/2019 08/29/15   [provider]  atorvastatin (LIPITOR) 20 MG tablet Take 20 mg by mouth daily.    [provider]  Cholecalciferol (VITAMIN D-1000 MAX ST) 25 MCG (1000 UT) tablet Take by mouth. Patient not taking: Reported on 12/05/2019    [provider]  clobetasol (TEMOVATE) 0.05 % external solution Mix 66mL into 1# jar of CeraVe and apply daily as directed 12/06/18   [provider]  famotidine (PEPCID) 20 MG tablet Take 20 mg by mouth 2 (two) times daily.    [provider]  levothyroxine (SYNTHROID) 50 MCG tablet Take 50 mcg by mouth daily before breakfast.  [provider]  metoprolol tartrate (LOPRESSOR) 25 MG tablet Take by mouth. 10/26/18 12/05/19  [provider]    Allergies Mushroom extract complex, Promethazine, Alprazolam, Cefdinir, Ciprofloxacin, Dilaudid [hydromorphone hcl], Hydromorphone, Phenergan [promethazine hcl], and Pravastatin  Family History  Problem Relation Age of Onset   Breast cancer Sister 10    Social History Social History   Tobacco Use   Smoking status: Never   Smokeless tobacco: Never  Vaping Use   Vaping Use: Never used  Substance Use Topics   Alcohol use: Not Currently   Drug use: Never    Review of Systems   Review of  Systems  Musculoskeletal:  Positive for arthralgias and back pain. Negative for gait problem.  Neurological:  Negative for seizures and weakness.  All other systems reviewed and are negative.  Physical Exam Updated Vital Signs BP 124/61 (BP Location: Left Arm)    Pulse (!) 59    Temp 98.4 F (36.9 C) (Oral)    Resp 20    Ht 5\' 5"  (1.651 m)    Wt 72.6 kg    LMP  (LMP Unknown)    SpO2 99%    BMI 26.63 kg/m   Physical Exam Vitals and nursing note reviewed.  Constitutional:      General: She is not in acute distress.    Appearance: Normal appearance.  HENT:     Head: Normocephalic and atraumatic.  Eyes:     General: No scleral icterus.    Conjunctiva/sclera: Conjunctivae normal.  Pulmonary:     Effort: Pulmonary effort is normal. No respiratory distress.     Breath sounds: No stridor.  Musculoskeletal:        General: No deformity or signs of injury.     Cervical back: Normal range of motion.     Comments: Mild tenderness to palpation in the thoracic midline  Skin:    General: Skin is dry.     Coloration: Skin is not jaundiced or pale.  Neurological:     General: No focal deficit present.     Mental Status: She is alert and oriented to person, place, and time. Mental status is at baseline.     Comments: 5/5 strength with plantarflexion, dorsiflexion, hip extension Sensation is grossly intact the bilateral lower extremities  Psychiatric:        Mood and Affect: Mood normal.        Behavior: Behavior normal.     LABS (all labs ordered are listed, but only abnormal results are displayed)  Labs Reviewed - No data to display ____________________________________________  EKG  ___________  RADIOLOGY Almeta Monas, personally viewed and evaluated these images (plain radiographs) as part of my medical decision making, as well as reviewing the written report by the radiologist.  ED MD interpretation:   I reviewed the x-rays of the lumbar and thoracic spine which shows  a acute compression fracture at T12 _________________________________    ____________________________________________   PROCEDURES  Procedure(s) performed (including Critical Care):  Procedures   ____________________________________________   INITIAL IMPRESSION / [place] / ED COURSE     Patient is an 80 year old female presenting with acute onset of back pain that started when she was putting her socks on yesterday.  Pain is located in the lower thoracic midline and wrapping around to the bilateral flanks.  She has no neurologic symptoms.  On exam she overall appears comfortable.  Does have tenderness in the midline thoracic spine.  X-rays obtained from triage  of the lumbar and thoracic spine show an acute T12 fracture with less than 20% loss of height and no retropulsion.  She has no neurologic symptoms.  Recommended pain control with Tylenol around-the-clock and a short course of NSAIDs as well as oxycodone for breakthrough pain at night.  We discussed the risks of opiates including increased risk of falls delirium etc. and I advised that she take these only for breakthrough pain.  I discussed following up with her primary care provider for evaluation of osteoporosis and also gave her surgery follow-up.  She is stable for discharge.      ____________________________________________   FINAL CLINICAL IMPRESSION(S) / ED DIAGNOSES  Final diagnoses:  Compression fracture of body of thoracic vertebra [facility])     ED Discharge Orders          Ordered    oxycodone (OXY-IR) 5 MG capsule  Every 4 hours PRN        05/07/21 1445             Note:  This document was prepared using Dragon voice recognition software and may include unintentional dictation errors.    Rada Hay, MD 05/07/21 [phone]

## 2021-05-07 NOTE — ED Triage Notes (Signed)
Pt to ED via POV with c/o mid back pain that goes to her lower back and then into her bilat hips, her right side hurts worse. This began yesterday after putting on her compression stockings. She states that the pain is just getting worse and now she feels like she cant sit, stand and walk.

## 2021-05-15 ENCOUNTER — Ambulatory Visit: Payer: Medicare Other | Admitting: Physical Therapy

## 2021-05-21 ENCOUNTER — Encounter: Payer: Self-pay | Admitting: Physical Therapy

## 2021-05-21 DIAGNOSIS — R262 Difficulty in walking, not elsewhere classified: Secondary | ICD-10-CM

## 2021-05-21 DIAGNOSIS — M6281 Muscle weakness (generalized): Secondary | ICD-10-CM

## 2021-05-21 DIAGNOSIS — R269 Unspecified abnormalities of gait and mobility: Secondary | ICD-10-CM

## 2021-05-21 DIAGNOSIS — R2681 Unsteadiness on feet: Secondary | ICD-10-CM

## 2021-05-21 DIAGNOSIS — R278 Other lack of coordination: Secondary | ICD-10-CM

## 2021-05-21 DIAGNOSIS — R2689 Other abnormalities of gait and mobility: Secondary | ICD-10-CM

## 2021-05-21 NOTE — Therapy (Signed)
[place] MAIN [facility] SERVICES [address] [place], [place], [address] Phone: [phone]   Fax:  [phone]  May 21, 2021   No Recipients  Physical Therapy Discharge Summary  Patient: Laura Cole  MRN: [id number]  Date of Birth: 1940-10-14   Diagnosis: Muscle weakness (generalized)  Unsteadiness on feet  Difficulty in walking, not elsewhere classified  Other abnormalities of gait and mobility  Other lack of coordination  Abnormality of gait and mobility Referring Provider (PT): Dr. Harrel Lemon   The above patient had been seen in Physical Therapy 10 times of 12 treatments scheduled with 0 no shows and 2 cancellations.  The treatment consisted of strengthening/balance exercise The patient is: Improved  Subjective: Patient requested to cancel all remaining appointments as she has a spinal compression fracture and has a cold. When conducting a chart review, therapist noticed patient has been referred to home health PT  Discharge Findings: unable to obtain objective measures as patient failed to return to therapy; Please see last progress note dated 04/29/2021    Goals Partially Met    Sincerely,   Aeson Sawyers, PT, DPT   CC No Recipients  [place] [address] [place], [place], [address] Phone: [phone]   Fax:  [phone]  Patient: Laura Cole  MRN: [id number]  Date of Birth: Oct 10, 1940

## 2021-05-22 ENCOUNTER — Ambulatory Visit: Payer: Medicare Other | Admitting: Physical Therapy

## 2021-05-24 ENCOUNTER — Ambulatory Visit: Payer: Medicare Other | Admitting: Physical Therapy

## 2021-05-25 ENCOUNTER — Other Ambulatory Visit: Payer: Self-pay

## 2021-05-25 ENCOUNTER — Encounter: Payer: Self-pay | Admitting: Intensive Care

## 2021-05-25 DIAGNOSIS — E876 Hypokalemia: Secondary | ICD-10-CM | POA: Diagnosis not present

## 2021-05-25 DIAGNOSIS — Z9102 Food additives allergy status: Secondary | ICD-10-CM

## 2021-05-25 DIAGNOSIS — M4854XA Collapsed vertebra, not elsewhere classified, thoracic region, initial encounter for fracture: Secondary | ICD-10-CM | POA: Diagnosis present

## 2021-05-25 DIAGNOSIS — Z885 Allergy status to narcotic agent status: Secondary | ICD-10-CM

## 2021-05-25 DIAGNOSIS — Z9049 Acquired absence of other specified parts of digestive tract: Secondary | ICD-10-CM

## 2021-05-25 DIAGNOSIS — U071 COVID-19: Principal | ICD-10-CM | POA: Diagnosis present

## 2021-05-25 DIAGNOSIS — Z8673 Personal history of transient ischemic attack (TIA), and cerebral infarction without residual deficits: Secondary | ICD-10-CM

## 2021-05-25 DIAGNOSIS — I10 Essential (primary) hypertension: Secondary | ICD-10-CM | POA: Diagnosis present

## 2021-05-25 DIAGNOSIS — E878 Other disorders of electrolyte and fluid balance, not elsewhere classified: Secondary | ICD-10-CM | POA: Diagnosis present

## 2021-05-25 DIAGNOSIS — Z888 Allergy status to other drugs, medicaments and biological substances status: Secondary | ICD-10-CM

## 2021-05-25 DIAGNOSIS — Z90710 Acquired absence of both cervix and uterus: Secondary | ICD-10-CM

## 2021-05-25 DIAGNOSIS — Z881 Allergy status to other antibiotic agents status: Secondary | ICD-10-CM

## 2021-05-25 DIAGNOSIS — Z85828 Personal history of other malignant neoplasm of skin: Secondary | ICD-10-CM

## 2021-05-25 DIAGNOSIS — Z96653 Presence of artificial knee joint, bilateral: Secondary | ICD-10-CM | POA: Diagnosis present

## 2021-05-25 DIAGNOSIS — K219 Gastro-esophageal reflux disease without esophagitis: Secondary | ICD-10-CM | POA: Diagnosis present

## 2021-05-25 DIAGNOSIS — Z79899 Other long term (current) drug therapy: Secondary | ICD-10-CM

## 2021-05-25 DIAGNOSIS — E039 Hypothyroidism, unspecified: Secondary | ICD-10-CM | POA: Diagnosis present

## 2021-05-25 DIAGNOSIS — E871 Hypo-osmolality and hyponatremia: Secondary | ICD-10-CM | POA: Diagnosis present

## 2021-05-25 DIAGNOSIS — Z7982 Long term (current) use of aspirin: Secondary | ICD-10-CM

## 2021-05-25 DIAGNOSIS — I2510 Atherosclerotic heart disease of native coronary artery without angina pectoris: Secondary | ICD-10-CM | POA: Diagnosis present

## 2021-05-25 DIAGNOSIS — A0839 Other viral enteritis: Secondary | ICD-10-CM | POA: Diagnosis present

## 2021-05-25 DIAGNOSIS — I739 Peripheral vascular disease, unspecified: Secondary | ICD-10-CM | POA: Diagnosis present

## 2021-05-25 DIAGNOSIS — Z79890 Hormone replacement therapy (postmenopausal): Secondary | ICD-10-CM

## 2021-05-25 DIAGNOSIS — E860 Dehydration: Secondary | ICD-10-CM | POA: Diagnosis present

## 2021-05-25 LAB — COMPREHENSIVE METABOLIC PANEL
ALT: 15 U/L (ref 0–44)
AST: 22 U/L (ref 15–41)
Albumin: 3.7 g/dL (ref 3.5–5.0)
Alkaline Phosphatase: 72 U/L (ref 38–126)
Anion gap: 9 (ref 5–15)
BUN: 15 mg/dL (ref 8–23)
CO2: 26 mmol/L (ref 22–32)
Calcium: 11 mg/dL — ABNORMAL HIGH (ref 8.9–10.3)
Chloride: 97 mmol/L — ABNORMAL LOW (ref 98–111)
Creatinine, Ser: 0.8 mg/dL (ref 0.44–1.00)
GFR, Estimated: >60 mL/min (ref >60)
Glucose, Bld: 140 mg/dL — ABNORMAL HIGH (ref 70–99)
Potassium: 2.5 mmol/L — CL (ref 3.5–5.1)
Sodium: 132 mmol/L — ABNORMAL LOW (ref 135–145)
Total Bilirubin: 0.7 mg/dL (ref 0.3–1.2)
Total Protein: 7 g/dL (ref 6.5–8.1)

## 2021-05-25 LAB — CBC
HCT: 38.4 % (ref 36.0–46.0)
Hemoglobin: 12.8 g/dL (ref 12.0–15.0)
MCH: 29.1 pg (ref 26.0–34.0)
MCHC: 33.3 g/dL (ref 30.0–36.0)
MCV: 87.3 fL (ref 80.0–100.0)
Platelets: 342 10*3/uL (ref 150–400)
RBC: 4.4 MIL/uL (ref 3.87–5.11)
RDW: 13.1 % (ref 11.5–15.5)
WBC: 6.3 10*3/uL (ref 4.0–10.5)
nRBC: 0 % (ref 0.0–0.2)

## 2021-05-25 LAB — TYPE AND SCREEN
ABO/RH(D): O POS
Antibody Screen: NEGATIVE

## 2021-05-25 MED ORDER — POTASSIUM CHLORIDE CRYS ER 20 MEQ PO TBCR
40.0000 meq | EXTENDED_RELEASE_TABLET | Freq: Once | ORAL | Status: AC
Start: 2021-05-25 — End: 2021-05-25
  Administered 2021-05-25: 40 meq via ORAL
  Filled 2021-05-25: qty 2

## 2021-05-25 NOTE — ED Notes (Signed)
Dr. Quentin Cornwall notified of potassium 2.5 no new orders received.

## 2021-05-25 NOTE — ED Triage Notes (Addendum)
Arrived by EMS from home with c/o poor po intake. Diagnosed covid 5 days ago. Reports a couple episodes today of bright red stool BM. V/S WNL per EMS, 60s HR, 168/80b/p, 100% RA, 18G L hand. C/o right sided abdominal pain

## 2021-05-26 ENCOUNTER — Inpatient Hospital Stay
Admission: EM | Admit: 2021-05-26 | Discharge: 2021-05-27 | DRG: 178 | Disposition: A | Discharge status: Home Health | Payer: Medicare Other | Attending: Hospitalist | Admitting: Hospitalist

## 2021-05-26 DIAGNOSIS — E876 Hypokalemia: Secondary | ICD-10-CM | POA: Diagnosis present

## 2021-05-26 DIAGNOSIS — E871 Hypo-osmolality and hyponatremia: Secondary | ICD-10-CM | POA: Diagnosis present

## 2021-05-26 DIAGNOSIS — I2510 Atherosclerotic heart disease of native coronary artery without angina pectoris: Secondary | ICD-10-CM | POA: Diagnosis present

## 2021-05-26 DIAGNOSIS — Z881 Allergy status to other antibiotic agents status: Secondary | ICD-10-CM | POA: Diagnosis not present

## 2021-05-26 DIAGNOSIS — A0839 Other viral enteritis: Secondary | ICD-10-CM | POA: Diagnosis present

## 2021-05-26 DIAGNOSIS — Z888 Allergy status to other drugs, medicaments and biological substances status: Secondary | ICD-10-CM | POA: Diagnosis not present

## 2021-05-26 DIAGNOSIS — U071 COVID-19: Secondary | ICD-10-CM

## 2021-05-26 DIAGNOSIS — Z9102 Food additives allergy status: Secondary | ICD-10-CM | POA: Diagnosis not present

## 2021-05-26 DIAGNOSIS — R531 Weakness: Secondary | ICD-10-CM

## 2021-05-26 DIAGNOSIS — Z85828 Personal history of other malignant neoplasm of skin: Secondary | ICD-10-CM | POA: Diagnosis not present

## 2021-05-26 DIAGNOSIS — M4854XA Collapsed vertebra, not elsewhere classified, thoracic region, initial encounter for fracture: Secondary | ICD-10-CM | POA: Diagnosis present

## 2021-05-26 DIAGNOSIS — E878 Other disorders of electrolyte and fluid balance, not elsewhere classified: Secondary | ICD-10-CM | POA: Diagnosis present

## 2021-05-26 DIAGNOSIS — I739 Peripheral vascular disease, unspecified: Secondary | ICD-10-CM | POA: Diagnosis present

## 2021-05-26 DIAGNOSIS — Z79899 Other long term (current) drug therapy: Secondary | ICD-10-CM | POA: Diagnosis not present

## 2021-05-26 DIAGNOSIS — Z9049 Acquired absence of other specified parts of digestive tract: Secondary | ICD-10-CM | POA: Diagnosis not present

## 2021-05-26 DIAGNOSIS — Z79890 Hormone replacement therapy (postmenopausal): Secondary | ICD-10-CM | POA: Diagnosis not present

## 2021-05-26 DIAGNOSIS — R351 Nocturia: Secondary | ICD-10-CM

## 2021-05-26 DIAGNOSIS — E860 Dehydration: Secondary | ICD-10-CM | POA: Diagnosis present

## 2021-05-26 DIAGNOSIS — I10 Essential (primary) hypertension: Secondary | ICD-10-CM | POA: Diagnosis present

## 2021-05-26 DIAGNOSIS — E039 Hypothyroidism, unspecified: Secondary | ICD-10-CM | POA: Diagnosis present

## 2021-05-26 DIAGNOSIS — Z7982 Long term (current) use of aspirin: Secondary | ICD-10-CM | POA: Diagnosis not present

## 2021-05-26 DIAGNOSIS — Z90710 Acquired absence of both cervix and uterus: Secondary | ICD-10-CM | POA: Diagnosis not present

## 2021-05-26 DIAGNOSIS — Z96653 Presence of artificial knee joint, bilateral: Secondary | ICD-10-CM | POA: Diagnosis present

## 2021-05-26 DIAGNOSIS — R197 Diarrhea, unspecified: Secondary | ICD-10-CM

## 2021-05-26 DIAGNOSIS — K219 Gastro-esophageal reflux disease without esophagitis: Secondary | ICD-10-CM | POA: Diagnosis present

## 2021-05-26 DIAGNOSIS — Z8673 Personal history of transient ischemic attack (TIA), and cerebral infarction without residual deficits: Secondary | ICD-10-CM | POA: Diagnosis not present

## 2021-05-26 DIAGNOSIS — Z885 Allergy status to narcotic agent status: Secondary | ICD-10-CM | POA: Diagnosis not present

## 2021-05-26 HISTORY — DX: Unspecified hemorrhoids: K64.9

## 2021-05-26 LAB — BASIC METABOLIC PANEL
Anion gap: 8 (ref 5–15)
BUN: 10 mg/dL (ref 8–23)
CO2: 24 mmol/L (ref 22–32)
Calcium: 9.2 mg/dL (ref 8.9–10.3)
Chloride: 106 mmol/L (ref 98–111)
Creatinine, Ser: 0.7 mg/dL (ref 0.44–1.00)
GFR, Estimated: >60 mL/min (ref >60)
Glucose, Bld: 126 mg/dL — ABNORMAL HIGH (ref 70–99)
Potassium: 2.8 mmol/L — ABNORMAL LOW (ref 3.5–5.1)
Sodium: 138 mmol/L (ref 135–145)

## 2021-05-26 LAB — MAGNESIUM: Magnesium: 2.1 mg/dL (ref 1.7–2.4)

## 2021-05-26 MED ORDER — LEVOTHYROXINE SODIUM 50 MCG PO TABS
50.0000 ug | ORAL_TABLET | Freq: Every day | ORAL | Status: DC
  Administered 2021-05-27: 50 ug via ORAL
  Filled 2021-05-26: qty 1

## 2021-05-26 MED ORDER — SODIUM CHLORIDE 0.9 % IV SOLN: INTRAVENOUS | Status: DC

## 2021-05-26 MED ORDER — SODIUM CHLORIDE 0.9 % IV BOLUS
1000.0000 mL | Freq: Once | INTRAVENOUS | Status: AC
  Administered 2021-05-26: 1000 mL via INTRAVENOUS

## 2021-05-26 MED ORDER — BACLOFEN 10 MG PO TABS
10.0000 mg | ORAL_TABLET | Freq: Three times a day (TID) | ORAL | Status: DC | PRN
  Administered 2021-05-26 – 2021-05-27 (×2): 10 mg via ORAL
  Filled 2021-05-26 (×5): qty 1

## 2021-05-26 MED ORDER — PANTOPRAZOLE SODIUM 40 MG PO TBEC
40.0000 mg | DELAYED_RELEASE_TABLET | Freq: Every day | ORAL | Status: DC
  Administered 2021-05-26 – 2021-05-27 (×2): 40 mg via ORAL
  Filled 2021-05-26 (×2): qty 1

## 2021-05-26 MED ORDER — ACETAMINOPHEN 650 MG RE SUPP: 650.0000 mg | Freq: Four times a day (QID) | RECTAL | Status: DC | PRN

## 2021-05-26 MED ORDER — POTASSIUM CHLORIDE 10 MEQ/100ML IV SOLN
10.0000 meq | Freq: Once | INTRAVENOUS | Status: AC
  Administered 2021-05-26: 10 meq via INTRAVENOUS
  Filled 2021-05-26: qty 100

## 2021-05-26 MED ORDER — ONDANSETRON HCL 4 MG/2ML IJ SOLN: 4.0000 mg | Freq: Four times a day (QID) | INTRAMUSCULAR | Status: DC | PRN

## 2021-05-26 MED ORDER — SODIUM CHLORIDE 0.9 % IV SOLN
200.0000 mg | Freq: Once | INTRAVENOUS | Status: AC
  Administered 2021-05-26: 200 mg via INTRAVENOUS
  Filled 2021-05-26: qty 200

## 2021-05-26 MED ORDER — MAGNESIUM SULFATE 2 GM/50ML IV SOLN
2.0000 g | Freq: Once | INTRAVENOUS | Status: AC
  Administered 2021-05-26: 2 g via INTRAVENOUS
  Filled 2021-05-26: qty 50

## 2021-05-26 MED ORDER — SODIUM CHLORIDE 0.9 % IV SOLN
100.0000 mg | Freq: Every day | INTRAVENOUS | Status: DC
  Administered 2021-05-27: 100 mg via INTRAVENOUS
  Filled 2021-05-26 (×2): qty 20

## 2021-05-26 MED ORDER — PANTOPRAZOLE SODIUM 20 MG PO TBEC: 20.0000 mg | DELAYED_RELEASE_TABLET | Freq: Two times a day (BID) | ORAL | Status: DC

## 2021-05-26 MED ORDER — ACETAMINOPHEN 325 MG PO TABS: 650.0000 mg | ORAL_TABLET | Freq: Four times a day (QID) | ORAL | Status: DC | PRN

## 2021-05-26 MED ORDER — POTASSIUM CHLORIDE CRYS ER 20 MEQ PO TBCR
40.0000 meq | EXTENDED_RELEASE_TABLET | Freq: Two times a day (BID) | ORAL | Status: AC
  Administered 2021-05-26 (×2): 40 meq via ORAL
  Filled 2021-05-26 (×2): qty 2

## 2021-05-26 NOTE — ED Notes (Signed)
Informed RN bed assigned 

## 2021-05-26 NOTE — Progress Notes (Signed)
PT Cancellation Note  Patient Details Name: Laura Cole MRN: [id number] DOB: 03/22/1941   Cancelled Treatment:    Reason Eval/Treat Not Completed: Patient not medically ready.   Guidance needed for T12 compression fracture, no information on care esp is brace needed?  Follow up as pt information permits.   Ramond Dial 05/26/2021, 1:20 PM  Mee Hives, PT PhD Acute Rehab Dept. Number: [place]

## 2021-05-26 NOTE — Consult Note (Signed)
Remdesivir - Pharmacy Brief Note   O:  ALT: 15 CXR: N/A SpO2: Saturations appropriate on room air   A/P:  05/22/21 SARS-CoV-2 PCR (+). Was Rx'd molnupiravir outpatient. Symptoms predominantly GI at this point  Remdesivir 200 mg IVPB once followed by 100 mg IVPB daily x 4 days.   May consider decreasing total duration to 3 days depending on clinical course  Benita Gutter  05/26/2021 8:30 AM

## 2021-05-26 NOTE — H&P (Signed)
History and Physical    Laura Cole ERD:[id number] DOB: 12-07-1940 DOA: 05/26/2021  PCP: Laura Hire, MD  Patient coming from: Home  I have personally briefly reviewed patient's old medical records in [place]  Chief Complaint: Watery diarrhea, COVID-positive  HPI: Laura Cole is a 81 y.o. female with medical history significant of hypertension, hypothyroidism,  history of hemorrhoids, GERD, compression fracture of T12 present here with severe diarrhea since 3 days.  Patient tested positive for COVID 5 days ago at her primary care office (COVID result in care everywhere) and was started on antiviral medicine [facility]) which she started taking on 05/22/2021 however she started having severe watery diarrhea after 2 days taking antiviral medicine.  She noticed bright red blood per rectum x2 however she has a history of internal and external hemorrhoids.  She also sees blood on the toilet paper.  Reports diarrhea is associated with poor appetite, generalized weakness, bad taste in her mouth.  Has some productive cough with white phlegm however denies association with shortness of breath, wheezing.  No fever, chills, nausea, vomiting, abdominal pain.  She lives alone at home and uses cane for ambulation at baseline.  No history of tobacco abuse, alcohol abuse or any illicit drug abuse. Recently had compression fraction of T12 and took oxycodone for short time period.    ED Course: Upon arrival to ED: Pulse 60, BP 145/55, maintaining oxygen saturation on room air, afebrile.  CBC: No leukocytosis, H&H: 12.8/38.4, platelet: 342.  CMP shows sodium of 132, potassium 2.5, chloride 97.  Kidney function: Within normal limits.  Patient was given IV fluids along with potassium and magnesium replacement.  [facility] hospitalist consulted for admission for severe dehydration due to diarrhea secondary to COVID-19 infection.    Review of Systems: As per HPI otherwise negative.    Past Medical History:   Diagnosis Date   Bradycardia    Cancer ([place])    skin cancer   Coronary artery disease    Diverticulitis    GERD (gastroesophageal reflux disease)    Heart murmur    Hemorrhoids    Hypertension    Hypothyroidism    Osteoarthritis    Peripheral vascular disease ([place])    TIA (transient ischemic attack)     Past Surgical History:  Procedure Laterality Date   ABDOMINAL HYSTERECTOMY     BACK SURGERY     CHOLECYSTECTOMY     COLON SURGERY     COLONOSCOPY WITH PROPOFOL N/A 12/05/2019   Procedure: COLONOSCOPY WITH PROPOFOL;  Surgeon: Lesly Rubenstein, MD;  Location: [facility] ENDOSCOPY;  Service: Endoscopy;  Laterality: N/A;   ESOPHAGOGASTRODUODENOSCOPY (EGD) WITH PROPOFOL N/A 12/05/2019   Procedure: ESOPHAGOGASTRODUODENOSCOPY (EGD) WITH PROPOFOL;  Surgeon: Lesly Rubenstein, MD;  Location: [facility] ENDOSCOPY;  Service: Endoscopy;  Laterality: N/A;   EYE SURGERY     RECTOCELE REPAIR     RECTOCELE REPAIR     REPLACEMENT TOTAL KNEE Bilateral    TKRX2 Bilateral    WRIST SURGERY       reports that she has never smoked. She has never used smokeless tobacco. She reports that she does not currently use alcohol. She reports that she does not use drugs.  Allergies  Allergen Reactions   Mushroom Extract Complex Nausea Only and Nausea And Vomiting    Other reaction(s): Vomiting   Promethazine     Other reaction(s): Unknown   Alprazolam     Other reaction(s): Other (See Comments)   Cefdinir Nausea Only   Ciprofloxacin  Other reaction(s): Other (See Comments)   Dilaudid [Hydromorphone Hcl]    Hydromorphone    Phenergan [Promethazine Hcl]    Pravastatin     Other reaction(s): Muscle Pain    Family History  Problem Relation Age of Onset   Breast cancer Sister 96    Prior to Admission medications   Medication Sig Start Date End Date Taking? Authorizing Provider  amLODipine (NORVASC) 5 MG tablet  10/26/18   [provider]  aspirin 81 MG chewable tablet Chew by  mouth. Patient not taking: Reported on 12/05/2019 08/29/15   [provider]  atorvastatin (LIPITOR) 20 MG tablet Take 20 mg by mouth daily.    [provider]  Cholecalciferol (VITAMIN D-1000 MAX ST) 25 MCG (1000 UT) tablet Take by mouth. Patient not taking: Reported on 12/05/2019    [provider]  clobetasol (TEMOVATE) 0.05 % external solution Mix 51mL into 1# jar of CeraVe and apply daily as directed 12/06/18   [provider]  famotidine (PEPCID) 20 MG tablet Take 20 mg by mouth 2 (two) times daily.    [provider]  levothyroxine (SYNTHROID) 50 MCG tablet Take 50 mcg by mouth daily before breakfast.    [provider]  metoprolol tartrate (LOPRESSOR) 25 MG tablet Take by mouth. 10/26/18 12/05/19  [provider]    Physical Exam: Vitals:   05/25/21 2133 05/26/21 0043 05/26/21 0245 05/26/21 0700  BP: (!) 158/58 137/89 (!) 168/75 120/86  Pulse: (!) 59 82 60 100  Resp:  18 18 (!) 24  Temp:  98 F (36.7 C)    TempSrc:  Oral    SpO2: 94% 99% 98% 97%  Weight:      Height:        Constitutional: NAD, calm, comfortable, on room air, communicating well, appears dehydrated Eyes: PERRL, lids and conjunctivae normal ENMT: Mucous membranes are dry. Posterior pharynx clear of any exudate or lesions.Normal dentition.  Neck: normal, supple, no masses, no thyromegaly Respiratory: clear to auscultation bilaterally, no wheezing, no crackles. Normal respiratory effort. No accessory muscle use.  Cardiovascular: Regular rate and rhythm, no murmurs / rubs / gallops. No extremity edema. 2+ pedal pulses. No carotid bruits.  Abdomen: no tenderness, no masses palpated. No hepatosplenomegaly. Bowel sounds positive.  Musculoskeletal: no clubbing / cyanosis. No joint deformity upper and lower extremities. Good ROM, no contractures. Normal muscle tone.  Skin: no rashes, lesions, ulcers. No induration Neurologic: CN 2-12 grossly intact. Sensation  intact, DTR normal. Strength 5/5 in all 4.  Psychiatric: Normal judgment and insight. Alert and oriented x 3. Normal mood.    Labs on Admission: I have personally reviewed following labs and imaging studies  CBC: Recent Labs  Lab 05/25/21 1826  WBC 6.3  HGB 12.8  HCT 38.4  MCV 87.3  PLT 947   Basic Metabolic Panel: Recent Labs  Lab 05/25/21 1826  NA 132*  K 2.5*  CL 97*  CO2 26  GLUCOSE 140*  BUN 15  CREATININE 0.80  CALCIUM 11.0*   GFR: Estimated Creatinine Clearance: 57.6 mL/min (by C-G formula based on SCr of 0.8 mg/dL). Liver Function Tests: Recent Labs  Lab 05/25/21 1826  AST 22  ALT 15  ALKPHOS 72  BILITOT 0.7  PROT 7.0  ALBUMIN 3.7   No results for input(s): LIPASE, AMYLASE in the last 168 hours. No results for input(s): AMMONIA in the last 168 hours. Coagulation Profile: No results for input(s): INR, PROTIME in the last 168 hours. Cardiac  Enzymes: No results for input(s): CKTOTAL, CKMB, CKMBINDEX, TROPONINI in the last 168 hours. BNP (last 3 results) No results for input(s): PROBNP in the last 8760 hours. HbA1C: No results for input(s): HGBA1C in the last 72 hours. CBG: No results for input(s): GLUCAP in the last 168 hours. Lipid Profile: No results for input(s): CHOL, HDL, LDLCALC, TRIG, CHOLHDL, LDLDIRECT in the last 72 hours. Thyroid Function Tests: No results for input(s): TSH, T4TOTAL, FREET4, T3FREE, THYROIDAB in the last 72 hours. Anemia Panel: No results for input(s): VITAMINB12, FOLATE, FERRITIN, TIBC, IRON, RETICCTPCT in the last 72 hours. Urine analysis:    Component Value Date/Time   APPEARANCEUR [place] (A) 01/11/2019 1518   GLUCOSEU Negative 01/11/2019 1518   BILIRUBINUR Negative 01/11/2019 1518   PROTEINUR Negative 01/11/2019 1518   NITRITE Negative 01/11/2019 1518   LEUKOCYTESUR Trace (A) 01/11/2019 1518    Radiological Exams on Admission: No results found.  EKG: Independently reviewed.  Normal sinus rhythm with sinus  arrhythmias, right bundle branch block.  Age-indeterminate septal infarct.  Assessment/Plan Principal Problem:   Gastroenteritis due to COVID-19 virus Active Problems:   Hypokalemia   Hyponatremia   Dehydration secondary to gastroenteritis due to COVID-19 virus: -Patient presenting with severe diarrhea, poor appetite -Tested positive for COVID on 05/23/2021 -Given IV fluids in the ED. -Admit patient on the floor.  Continue gentle hydration -Zofran as needed for nausea and vomiting -full liquid diet-advance as tolerated -Monitor vitals closely  Bright red blood per rectum: -As per patient she had 2 episodes.  Has history of hemorrhoids. -CBC: H&H is stable -Continue to monitor.  She is currently hemodynamically stable.  Covid Positive: -Patient is currently maintaining oxygen saturation on room air. -has productive cough. start Remdisvir x3 days  Hypokalemia: Potassium 2.5 -Replenished.  Check magnesium level -Repeat BMP tomorrow a.m.  Hyponatremia/hypochloremia: -Likely secondary to diarrhea -Continue gentle hydration.  Repeat BMP tomorrow a.m.  Hypothyroidism: Continue levothyroxine  Hypertension: Blood pressure is currently stable -We will hold antihypertensive medication at this time due to dehydration -Monitor blood pressure closely and resume antihypertensive medication once she is back to her baseline.  Compression fracture of T12: -Reviewed recent x-ray from 05/07/2021 -Continue baclofen -We will consult physical therapy.  GERD: Continue PPI   DVT prophylaxis: SCD, no chemical anticoagulation due to bright red blood per rectum Code Status: Full code-confirmed with the patient Family Communication: None present at bedside.  Plan of care discussed with patient in length and she verbalized understanding and agreed with it. Disposition Plan: home Consults called: none Admission status: inpatient   Mckinley Jewel MD [facility]  If 7PM-7AM, please  contact night-coverage [email or web address]  05/26/2021, 7:35 AM

## 2021-05-26 NOTE — ED Notes (Signed)
Pt assisted to restroom again with ed tech

## 2021-05-26 NOTE — ED Notes (Signed)
Patient had moderate to severe coughing fit but did not desaturate and remained at 97-100% O2. Patient coughed up multiple amounts of clear sputum into tissues and emesis bag.

## 2021-05-26 NOTE — ED Notes (Signed)
Pt moved from ED stretcher to hospital bed.  

## 2021-05-26 NOTE — ED Notes (Signed)
Request made for transport to the floor ?

## 2021-05-26 NOTE — ED Provider Notes (Signed)
[facility] Provider Note    Event Date/Time   First MD Initiated Contact with Patient 05/26/21 [phone]     (approximate)   History   Rectal Bleeding and Covid Positive   HPI  Laura Cole is a 81 y.o. female whose medical history most notably includes a recent diagnosis of COVID about 5 days ago at her primary care doctor's office.  She was started on antiviral medication and subsequently developed severe diarrhea.  She presents tonight for evaluation of persistent severe diarrhea and bright red blood per rectum.  She says she has a history of internal and external hemorrhoids and sometimes she will see blood on her toilet paper, but this is a substantial amount of blood, more than she is used to.  She denies having any abdominal pain, just the persistent diarrhea.  She denies fever, chest pain, and shortness of breath.  She said that she has decreased taste sensation and decreased oral intake but otherwise is not having many symptoms associated with COVID.  However she reports that the diarrhea and the rectal bleeding are severe.     Physical Exam   Triage Vital Signs: ED Triage Vitals  Enc Vitals Group     BP 05/25/21 1816 (!) 145/55     Pulse Rate 05/25/21 1816 60     Resp 05/25/21 1816 16     Temp 05/25/21 1816 98.5 F (36.9 C)     Temp Source 05/25/21 1816 Oral     SpO2 05/25/21 1816 99 %     Weight 05/25/21 1809 73.5 kg (162 lb)     Height 05/25/21 1809 1.676 m (5\' 6" )     Head Circumference --      Peak Flow --      Pain Score 05/25/21 1809 9     Pain Loc --      Pain Edu? --      Excl. in [place]? --     Most recent vital signs: Vitals:   05/26/21 0043 05/26/21 0245  BP: 137/89 (!) 168/75  Pulse: 82 60  Resp: 18   Temp: 98 F (36.7 C)   SpO2: 99% 98%     General: Awake, appears uncomfortable but not in severe distress and nontoxic. CV:  Good peripheral perfusion.  Normal heart rate and rhythm. Resp:  Normal effort.  No cough observed  during my assessment. Abd:  No tenderness to palpation of the abdomen.  Rectal exam notable for nonbleeding external hemorrhoid.  No significant tenderness to palpation on digital exam, but bright red blood is present along with light brown stool.  ED nurse present as chaperone.    ED Results / Procedures / Treatments   Labs (all labs ordered are listed, but only abnormal results are displayed) Labs Reviewed  COMPREHENSIVE METABOLIC PANEL - Abnormal; Notable for the following components:      Result Value   Sodium 132 (*)    Potassium 2.5 (*)    Chloride 97 (*)    Glucose, Bld 140 (*)    Calcium 11.0 (*)    All other components within normal limits  CBC  MAGNESIUM  POC OCCULT BLOOD, ED  TYPE AND SCREEN     EKG  ED ECG REPORT I, Hinda Kehr, the attending physician, personally viewed and interpreted this ECG.  Date: 05/26/2021 EKG Time: 6:24 AM Rate: 88 Rhythm: Normal sinus rhythm with sinus arrhythmia QRS Axis: normal Intervals: Incomplete right bundle branch block ST/T Wave abnormalities: Non-specific ST  segment / T-wave changes, but no clear evidence of acute ischemia. Narrative Interpretation: no definitive evidence of acute ischemia; does not meet STEMI criteria.     PROCEDURES:  Critical Care performed: Yes, see critical care procedure note(s)  .Critical Care Performed by: Hinda Kehr, MD Authorized by: Hinda Kehr, MD   Critical care provider statement:    Critical care time (minutes):  30   Critical care time was exclusive of:  Separately billable procedures and treating other patients   Critical care was necessary to treat or prevent imminent or life-threatening deterioration of the following conditions:  Metabolic crisis   Critical care was time spent personally by me on the following activities:  Development of treatment plan with patient or surrogate, evaluation of patient's response to treatment, examination of patient, obtaining history from  patient or surrogate, ordering and performing treatments and interventions, ordering and review of laboratory studies, ordering and review of radiographic studies, pulse oximetry, re-evaluation of patient's condition and review of old charts .1-3 Lead EKG Interpretation Performed by: Hinda Kehr, MD Authorized by: Hinda Kehr, MD     Interpretation: normal     ECG rate:  60   ECG rate assessment: normal     Rhythm: sinus rhythm     Ectopy: none     Conduction: normal     MEDICATIONS ORDERED IN ED: Medications  magnesium sulfate IVPB 2 g 50 mL (has no administration in time range)  potassium chloride 10 mEq in 100 mL IVPB (has no administration in time range)  sodium chloride 0.9 % bolus 1,000 mL (has no administration in time range)  potassium chloride SA (KLOR-CON M) CR tablet 40 mEq (40 mEq Oral Given 05/25/21 2133)     IMPRESSION / MDM / ASSESSMENT AND PLAN / ED COURSE  I reviewed the triage vital signs and the nursing notes.                              Differential diagnosis includes, but is not limited to, medication side effect, COVID sequela, metabolic or electrolyte abnormality, hemorrhoids, AV malformation,    Patient's vital signs are stable and within normal limits other than hypertension.  She is in no respiratory distress.  I reviewed her primary care doctor note in care everywhere and saw her clinic visit 4 days ago where she was diagnosed with COVID-19 and I verified the positive result in the computer system.  I saw where she was prescribed Molnupiravir, and I believe that most likely her diarrhea as a result of side effects to this medication.  She is not having any abdominal pain and she has no tenderness to palpation.  I believe that the bright red blood per rectum is most likely the result of her diarrhea and her internal and external hemorrhoids rather than being a more acute or severe issue such as an AV malformation or diverticulosis.  There is no indication  for imaging at this time.  Her hemoglobin is stable and she has no leukocytosis.  However we also ordered a comprehensive metabolic panel and she has very slight hyponatremia but has profound hypokalemia with a potassium of 2.5.  I treated her with 40 mill equivalents of potassium by mouth and ordered 10 mill equivalents of potassium by IV as well as magnesium 2 g IV to help with potassium reabsorption.  I believe that she is also volume depleted so I ordered normal saline 1 L IV bolus.  In spite of the hypokalemia, the patient's EKG is reassuring.  I discussed the plan with the patient and she agrees with the plan for admission.      Clinical Course as of 05/26/21 0556  Sun May 26, 2021  0551 I consulted Dr. Damita Dunnings with the hospitalist service.  We discussed the case and she will admit the patient and agrees with the plan of care thus far. [CF]    Clinical Course User Index [CF] Hinda Kehr, MD     FINAL CLINICAL IMPRESSION(S) / ED DIAGNOSES   Final diagnoses:  None     Rx / DC Orders   ED Discharge Orders     None        Note:  This document was prepared using Dragon voice recognition software and may include unintentional dictation errors.   Hinda Kehr, MD 05/26/21 [phone]

## 2021-05-27 ENCOUNTER — Ambulatory Visit: Payer: Medicare Other | Admitting: Physical Therapy

## 2021-05-27 LAB — CBC
HCT: 37.1 % (ref 36.0–46.0)
Hemoglobin: 12.4 g/dL (ref 12.0–15.0)
MCH: 29.1 pg (ref 26.0–34.0)
MCHC: 33.4 g/dL (ref 30.0–36.0)
MCV: 87.1 fL (ref 80.0–100.0)
Platelets: 329 10*3/uL (ref 150–400)
RBC: 4.26 MIL/uL (ref 3.87–5.11)
RDW: 13.3 % (ref 11.5–15.5)
WBC: 7.3 10*3/uL (ref 4.0–10.5)
nRBC: 0 % (ref 0.0–0.2)

## 2021-05-27 LAB — BASIC METABOLIC PANEL
Anion gap: 7 (ref 5–15)
BUN: 10 mg/dL (ref 8–23)
CO2: 24 mmol/L (ref 22–32)
Calcium: 8.7 mg/dL — ABNORMAL LOW (ref 8.9–10.3)
Chloride: 105 mmol/L (ref 98–111)
Creatinine, Ser: 0.64 mg/dL (ref 0.44–1.00)
GFR, Estimated: >60 mL/min (ref >60)
Glucose, Bld: 94 mg/dL (ref 70–99)
Potassium: 3.4 mmol/L — ABNORMAL LOW (ref 3.5–5.1)
Sodium: 136 mmol/L (ref 135–145)

## 2021-05-27 MED ORDER — METOPROLOL TARTRATE 25 MG PO TABS
12.5000 mg | ORAL_TABLET | Freq: Two times a day (BID) | ORAL | Status: DC
  Administered 2021-05-27: 12.5 mg via ORAL
  Filled 2021-05-27: qty 1

## 2021-05-27 MED ORDER — POTASSIUM CHLORIDE CRYS ER 20 MEQ PO TBCR
40.0000 meq | EXTENDED_RELEASE_TABLET | Freq: Two times a day (BID) | ORAL | Status: DC
  Administered 2021-05-27: 40 meq via ORAL
  Filled 2021-05-27: qty 2

## 2021-05-27 MED ORDER — AMLODIPINE BESYLATE 5 MG PO TABS: 5.0000 mg | ORAL_TABLET | Freq: Two times a day (BID) | ORAL | Status: DC

## 2021-05-27 MED ORDER — AMLODIPINE BESYLATE 5 MG PO TABS
5.0000 mg | ORAL_TABLET | Freq: Two times a day (BID) | ORAL | Status: DC
  Administered 2021-05-27: 5 mg via ORAL
  Filled 2021-05-27: qty 1

## 2021-05-27 NOTE — Progress Notes (Signed)
AVS reprinted, placed in discharge folder with the EMS packet. Awaiting for EMS.

## 2021-05-27 NOTE — Evaluation (Signed)
Occupational Therapy Evaluation Patient Details Name: Laura Cole MRN: [id number] DOB: 07-04-1940 Today's Date: 05/27/2021   History of Present Illness Pt is an 81 y.o. female presenting to hospital 1/7 with c/o persistent severe diarrhea and bright red blood per rectum; recent diagnosis of COVID 5 days prior at PCP's (decreased taste sensation and decreased oral intake).  Pt admitted with dehydration secondary to gastroeneteritis d/t COVID-19 virus, BRBPR, hypokalemia, hyponatremia/hypochloremia, h/o compression fx T12 (from imaging 05/07/21), and h/o T8 kyphoplasty.   Clinical Impression   Ms Leaman was seen for OT evaluation this date. Prior to hospital admission, pt was MOD I for community mobility using SPC, no AD for household ADLs and assist for IADLs. Pt lives alone in 1st floor apartment with family available PRN. Pt presents to acute OT demonstrating impaired ADL performance and functional mobility 2/2 decreased activity tolerance and functional strength/balance deficits. Pt currently requires SETUP + SUPERVISION don/doff B socks and pants - RW for standing portion and cues for safe technique. SUPERVISION + RW for BSC t/f. Pt recalled 2/3 back precautions at start of session. Educated re: back pcns, d/c recs, AE recs, DME recs, falls prevention, and ECS. Pt would benefit from skilled OT to address noted impairments and functional limitations (see below for any additional details). Upon hospital discharge, recommend HHOT to maximize pt safety and return to PLOF.       Recommendations for follow up therapy are one component of a multi-disciplinary discharge planning process, led by the attending physician.  Recommendations may be updated based on patient status, additional functional criteria and insurance authorization.   Follow Up Recommendations  Home health OT    Assistance Recommended at Discharge Intermittent Supervision/Assistance  Patient can return home with the following Assistance  with cooking/housework;Help with stairs or ramp for entrance    Functional Status Assessment  Patient has had a recent decline in their functional status and demonstrates the ability to make significant improvements in function in a reasonable and predictable amount of time.  Equipment Recommendations  BSC/3in1    Recommendations for Other Services       Precautions / Restrictions Precautions Precautions: Fall Precaution Comments: Spinal precautions (T12 fx noted on imaging 05/07/21).  Per medical appt with Dr. Izora Ribas (Neurosurgery) on 05/22/21: recommending TLSO brace for T12 compression fx (pt with ongoing pain):  in regards to TLSO brace/POC--"She should wear this most of the time when she is upright. At this point, we will hold off on kyphoplasty. If she has worsening pain or is not improving or has a change in the confirmation of her fracture at her next visit, we will discuss kyphoplasty."  Pt reports plan for TLSO brace (has outpatient appointment for this with [facility] planned but does not have brace yet). Restrictions Weight Bearing Restrictions: No      Mobility Bed Mobility Overal bed mobility: Needs Assistance Bed Mobility: Sit to Supine     Supine to sit: Supervision Sit to supine: Supervision   General bed mobility comments: log rolling from flat bed    Transfers Overall transfer level: Needs assistance Equipment used: Rolling walker (2 wheels) Transfers: Sit to/from Stand;Bed to chair/wheelchair/BSC Sit to Stand: Min guard     Step pivot transfers: Min guard     General transfer comment: initial vc's for walker use      Balance Overall balance assessment: Needs assistance Sitting-balance support: No upper extremity supported;Feet supported Sitting balance-Leahy Scale: Good Sitting balance - Comments: steady sitting reaching within BOS   Standing  balance support: No upper extremity supported;During functional activity Standing balance-Leahy Scale:  Fair Standing balance comment: 1 minor LOB with dynamic stadning - corrects with RW                           ADL either performed or assessed with clinical judgement   ADL Overall ADL's : Needs assistance/impaired                                       General ADL Comments: SETUP + SUPERVISION don/doff B socks and pants - RW for standing portion and cues for safe technique. SUPERVISION + RW for BSC t/f.      Pertinent Vitals/Pain Pain Assessment: 0-10 Pain Score: 4  Pain Location: back (T12 area) Pain Descriptors / Indicators: Sore;Aching Pain Intervention(s): Limited activity within patient's tolerance;Repositioned     Hand Dominance Right   Extremity/Trunk Assessment Upper Extremity Assessment Upper Extremity Assessment: Generalized weakness   Lower Extremity Assessment Lower Extremity Assessment: Generalized weakness   Cervical / Trunk Assessment Cervical / Trunk Assessment: Other exceptions Cervical / Trunk Exceptions: forward head/shoulders   Communication Communication Communication: No difficulties   Cognition Arousal/Alertness: Awake/alert Behavior During Therapy: WFL for tasks assessed/performed Overall Cognitive Status: Within Functional Limits for tasks assessed                                 General Comments: decreased safety awareness     General Comments       Exercises Exercises: Other exercises Other Exercises Other Exercises: Pt educated re: back pcns, d/c recs, AE recs, DME recs, falls prevention   Shoulder Instructions      Home Living Family/patient expects to be discharged to:: Private residence [facility] apts; 1st floor) Living Arrangements: Alone Available Help at Discharge: Family;Available PRN/intermittently Type of Home: Apartment Home Access: Level entry     Home Layout: One level     Bathroom Shower/Tub: Teacher, early years/pre: Handicapped height     Home  Equipment: Grab bars - tub/shower;Grab bars - toilet;Cane - single point;Crutches          Prior Functioning/Environment Prior Level of Function : Independent/Modified Independent             Mobility Comments: Ambulatory no AD in home but uses SPC in community.  1 fall Dec 2021 (blacked out). ADLs Comments: Daughter comes to assist to take garbage out or laundry every 2-3 days; orders groceries from [employer] typically        OT Problem List: Decreased strength;Decreased activity tolerance;Impaired balance (sitting and/or standing);Decreased safety awareness      OT Treatment/Interventions: Self-care/ADL training;Therapeutic exercise;Energy conservation;DME and/or AE instruction;Therapeutic activities;Patient/family education;Balance training    OT Goals(Current goals can be found in the care plan section) Acute Rehab OT Goals Patient Stated Goal: to go home OT Goal Formulation: With patient Time For Goal Achievement: 06/10/21 Potential to Achieve Goals: Good ADL Goals Pt Will Perform Grooming: Independently;standing Pt Will Perform Lower Body Dressing: sit to/from stand;with modified independence (c LRAD PRN) Pt Will Transfer to Toilet: with modified independence;ambulating;regular height toilet;grab bars (c LRAD PRN) Pt Will Perform Tub/Shower Transfer: Shower transfer;with modified independence;grab bars  OT Frequency: Min 2X/week    Co-evaluation  AM-PAC OT "6 Clicks" Daily Activity     Outcome Measure Help from another person eating meals?: None Help from another person taking care of personal grooming?: A Little Help from another person toileting, which includes using toliet, bedpan, or urinal?: A Little Help from another person bathing (including washing, rinsing, drying)?: A Little Help from another person to put on and taking off regular upper body clothing?: None Help from another person to put on and taking off regular lower body clothing?: A  Little 6 Click Score: 20   End of Session Equipment Utilized During Treatment: Rolling walker (2 wheels)  Activity Tolerance: Patient tolerated treatment well Patient left: in bed;with call bell/phone within reach;with bed alarm set  OT Visit Diagnosis: Other abnormalities of gait and mobility (R26.89);Muscle weakness (generalized) (M62.81)                Time: 7473-4037 OT Time Calculation (min): 38 min Charges:  OT General Charges $OT Visit: 1 Visit OT Evaluation $OT Eval Moderate Complexity: 1 Mod OT Treatments $Self Care/Home Management : 23-37 mins  Dessie Coma, M.S. OTR/L  05/27/21, 3:27 PM  ascom [phone]

## 2021-05-27 NOTE — Evaluation (Signed)
Physical Therapy Evaluation Patient Details Name: Laura Cole MRN: [id number] DOB: 27-Oct-1940 Today's Date: 05/27/2021  History of Present Illness  Pt is an 81 y.o. female presenting to hospital 1/7 with c/o persistent severe diarrhea and bright red blood per rectum; recent diagnosis of COVID 5 days prior at PCP's (decreased taste sensation and decreased oral intake).  Pt admitted with dehydration secondary to gastroeneteritis d/t COVID-19 virus, BRBPR, hypokalemia, hyponatremia/hypochloremia, h/o compression fx T12 (from imaging 05/07/21), and h/o T8 kyphoplasty.  Clinical Impression  Pt reports plan for outpatient appointment with [facility] for TLSO brace for T12 compression fx--was supposed to have appt this coming Wednesday but pt cancelled appt d/t being in hospital (pt plans to reschedule appt) so pt does not have TLSO brace yet.  Prior to hospital admission, pt was ambulatory (no AD in home but used [facility] in community); lives in 1st level apt (no steps to navigate).  Currently pt is SBA semi-supine to sitting edge of bed via logrolling; CGA with transfers using RW; and CGA ambulating 120 feet with RW.  Generalized weakness noted.  During ambulation, initial short B LE steps noted but improved to partial to almost step through gait pattern with increased distance ambulating.  Pt did have 1 loss of balance (CGA provided for safety but pt able to self correct) when pt trying to pull up her underwear in standing (Occupational therapy consulted for ADL's d/t this concern).  4/10 back pain during session.  Pt would benefit from skilled PT to address noted impairments and functional limitations (see below for any additional details).  Upon hospital discharge, pt would benefit from [facility] (pt reports she had HHPT scheduled already to come out but not sure which company and does not have their number at hospital)--TOC notified.    Recommendations for follow up therapy are one component of a multi-disciplinary  discharge planning process, led by the attending physician.  Recommendations may be updated based on patient status, additional functional criteria and insurance authorization.  Follow Up Recommendations Home health PT    Assistance Recommended at Discharge PRN  Patient can return home with the following  Assistance with cooking/housework;A little help with bathing/dressing/bathroom    Equipment Recommendations Rolling walker (2 wheels);BSC/3in1  Recommendations for Other Services  OT consult    Functional Status Assessment Patient has had a recent decline in their functional status and demonstrates the ability to make significant improvements in function in a reasonable and predictable amount of time.     Precautions / Restrictions Precautions Precautions: Fall Precaution Comments: Spinal precautions (T12 fx noted on imaging 05/07/21).  Per medical appt with Dr. Izora Ribas (Neurosurgery) on 05/22/21: recommending TLSO brace for T12 compression fx (pt with ongoing pain):  in regards to TLSO brace/POC--"She should wear this most of the time when she is upright. At this point, we will hold off on kyphoplasty. If she has worsening pain or is not improving or has a change in the confirmation of her fracture at her next visit, we will discuss kyphoplasty."  Pt reports plan for TLSO brace (has outpatient appointment for this with [facility] planned but does not have brace yet). Restrictions Weight Bearing Restrictions: No      Mobility  Bed Mobility Overal bed mobility: Needs Assistance Bed Mobility: Supine to Sit     Supine to sit: Supervision;HOB elevated     General bed mobility comments: via logrolling; mild increased effort to perform on own    Transfers Overall transfer level: Needs assistance Equipment used: Rolling  walker (2 wheels) Transfers: Sit to/from Stand;Bed to chair/wheelchair/BSC Sit to Stand: Min guard   Step pivot transfers: Min guard (stand step turn bed to recliner  with RW)       General transfer comment: initial vc's for walker use    Ambulation/Gait Ambulation/Gait assistance: Min guard Gait Distance (Feet): 120 Feet Assistive device: Rolling walker (2 wheels)   Gait velocity: decreased     General Gait Details: initial short B LE steps noted but improved to partial to almost step through gait pattern with increased distance ambulating  Stairs            Wheelchair Mobility    Modified Rankin (Stroke Patients Only)       Balance Overall balance assessment: Needs assistance Sitting-balance support: No upper extremity supported;Feet supported Sitting balance-Leahy Scale: Good Sitting balance - Comments: steady sitting reaching within BOS   Standing balance support: Bilateral upper extremity supported;During functional activity Standing balance-Leahy Scale: Good Standing balance comment: steady ambulating with RW; pt did have 1 loss of balance when pulling up her underwear (in standing) requiring CGA for safety                             Pertinent Vitals/Pain Pain Assessment: 0-10 Pain Score: 4  Pain Location: back (T12 area) Pain Descriptors / Indicators: Sore;Aching Pain Intervention(s): Limited activity within patient's tolerance;Monitored during session;Repositioned Vitals (HR and O2 on room air) stable and WFL throughout treatment session.    Home Living Family/patient expects to be discharged to:: Private residence [facility] apts; 1st floor) Living Arrangements: Alone   Type of Home: Apartment Home Access: Level entry       Home Layout: One level Home Equipment: Grab bars - tub/shower;Grab bars - toilet;Cane - single point;Crutches      Prior Function Prior Level of Function : Independent/Modified Independent             Mobility Comments: Ambulatory no AD in home but uses SPC in community.  1 fall Dec 2021 (blacked out). ADLs Comments: Daughter comes to assist to take garbage out or  laundry every 2-3 days; orders groceries from [employer] typically     Hand Dominance        Extremity/Trunk Assessment   Upper Extremity Assessment Upper Extremity Assessment: Generalized weakness    Lower Extremity Assessment Lower Extremity Assessment: Generalized weakness    Cervical / Trunk Assessment Cervical / Trunk Assessment: Other exceptions Cervical / Trunk Exceptions: forward head/shoulders  Communication   Communication: No difficulties  Cognition Arousal/Alertness: Awake/alert Behavior During Therapy: WFL for tasks assessed/performed Overall Cognitive Status: Within Functional Limits for tasks assessed                                          General Comments  Nursing cleared pt for participation in physical therapy.  Pt agreeable to PT session.     Exercises     Assessment/Plan    PT Assessment Patient needs continued PT services  PT Problem List Decreased strength;Decreased activity tolerance;Decreased balance;Decreased mobility;Decreased knowledge of use of DME;Pain       PT Treatment Interventions DME instruction;Gait training;Functional mobility training;Therapeutic activities;Therapeutic exercise;Balance training;Patient/family education    PT Goals (Current goals can be found in the Care Plan section)  Acute Rehab PT Goals Patient Stated Goal: to improve strength and mobility PT  Goal Formulation: With patient Time For Goal Achievement: 06/10/21 Potential to Achieve Goals: Good    Frequency Min 2X/week     Co-evaluation               AM-PAC PT "6 Clicks" Mobility  Outcome Measure Help needed turning from your back to your side while in a flat bed without using bedrails?: None Help needed moving from lying on your back to sitting on the side of a flat bed without using bedrails?: A Little Help needed moving to and from a bed to a chair (including a wheelchair)?: A Little Help needed standing up from a chair using  your arms (e.g., wheelchair or bedside chair)?: A Little Help needed to walk in hospital room?: A Little Help needed climbing 3-5 steps with a railing? : A Lot 6 Click Score: 18    End of Session Equipment Utilized During Treatment: Gait belt Activity Tolerance: Patient tolerated treatment well Patient left: in chair;with call bell/phone within reach;with chair alarm set Nurse Communication: Mobility status;Precautions (pt's IV beeping) PT Visit Diagnosis: Other abnormalities of gait and mobility (R26.89);Muscle weakness (generalized) (M62.81)    Time: 1140-1230 PT Time Calculation (min) (ACUTE ONLY): 50 min   Charges:   PT Evaluation $PT Eval Low Complexity: 1 Low PT Treatments $Gait Training: 8-22 mins $Therapeutic Activity: 8-22 mins       Leitha Bleak, PT 05/27/21, 1:29 PM

## 2021-05-27 NOTE — Discharge Summary (Signed)
Physician Discharge Summary   Laura Cole  female DOB: 1941-04-24  VFI:[id number]  PCP: Baxter Hire, MD  Admit date: 05/26/2021 Discharge date: 05/27/2021  Admitted From: home Disposition:  home CODE STATUS: Full code  Discharge Instructions     Discharge instructions   Complete by: As directed    You have COVID infection with loose stool, but no signs of dehydration or respiratory problem, so you can go home to recover.   Dr. Enzo Bi [place] Hospital Course:  For full details, please see H&P, progress notes, consult notes and ancillary notes.  Briefly,  Laura Cole is a 81 y.o. female with medical history significant of hypertension, hypothyroidism, hemorrhoids, GERD, compression fracture of T12 presented here with severe diarrhea since 3 days.   Patient tested positive for COVID 5 days ago at her primary care office (COVID result in care everywhere) and was started on antiviral medicine [facility]) which she started taking on 05/22/2021 however she started having severe watery diarrhea after 2 days taking antiviral medicine.  She noticed bright red blood per rectum x2 however she has a history of internal and external hemorrhoids.    gastroenteritis due to COVID-19 virus: -Tested positive for COVID on 05/22/2021 --No clinical signs of dehydration, BUN and Cr remained wnl. -Given IV fluids in the ED. --Diarrhea improved after presentation, and was discharged the next day.   Bright red blood per rectum: -As per patient she had 2 episodes.  Has history of hemorrhoids.  Hgb remained wnl.   Covid Positive: -Patient is currently maintaining oxygen saturation on room air.  Hypokalemia:  Potassium 2.5, likely due to diarrhea. --repleted.   Hyponatremia/hypochloremia, resolved -Likely secondary to diarrhea  Hypothyroidism: Continue levothyroxine   Hypertension:  Blood pressure is currently stable. --cont home amlodipine and Lopressor   Compression fracture  of T12: -Reviewed recent x-ray from 05/07/2021 -Continue baclofen   GERD:  Continue PPI   Discharge Diagnoses:  Principal Problem:   Gastroenteritis due to COVID-19 virus Active Problems:   Hypokalemia   Hyponatremia   30 Day Unplanned Readmission Risk Score    Flowsheet Row ED to Hosp-Admission (Current) from 05/26/2021 in [place]  30 Day Unplanned Readmission Risk Score (%) 11.17 Filed at 05/27/2021 1200       This score is the patient's risk of an unplanned readmission within 30 days of being discharged (0 -100%). The score is based on dignosis, age, lab data, medications, orders, and past utilization.   Low:  0-14.9   Medium: 15-21.9   High: 22-29.9   Extreme: 30 and above         Discharge Instructions:  Allergies as of 05/27/2021       Reactions   Mushroom Extract Complex Nausea Only, Nausea And Vomiting   Other reaction(s): Vomiting   Promethazine    Other reaction(s): Unknown   Alprazolam    Other reaction(s): Other (See Comments)   Cefdinir Nausea Only   Ciprofloxacin    Other reaction(s): Other (See Comments)   Dilaudid [hydromorphone Hcl]    Hydromorphone    Phenergan [promethazine Hcl]    Pravastatin    Other reaction(s): Muscle Pain        Medication List     TAKE these medications    amLODipine 5 MG tablet Commonly known as: NORVASC Take 1 tablet (5 mg total) by mouth 2 (two) times daily. Home med. What changed:  medication strength how  much to take when to take this additional instructions   baclofen 10 MG tablet Commonly known as: LIORESAL Take 10 mg by mouth 3 (three) times daily as needed for muscle spasms.   levothyroxine 50 MCG tablet Commonly known as: SYNTHROID Take 50 mcg by mouth daily before breakfast.   metoprolol tartrate 25 MG tablet Commonly known as: LOPRESSOR Take 12.5 mg by mouth 2 (two) times daily.   molnupiravir EUA 200 MG Caps capsule Commonly known as:  LAGEVRIO Take 800 mg by mouth 2 (two) times daily.   ondansetron 4 MG disintegrating tablet Commonly known as: ZOFRAN-ODT Take 4 mg by mouth every 8 (eight) hours as needed for nausea/vomiting.   pantoprazole 20 MG tablet Commonly known as: PROTONIX Take 20 mg by mouth 2 (two) times daily.               Durable Medical Equipment  (From admission, onward)           Start     Ordered   05/27/21 1513  For home use only DME Walker rolling  Once       Question Answer Comment  Walker: With [place] Wheels   Patient needs a walker to treat with the following condition Weakness      05/27/21 1512   05/27/21 1512  For home use only DME 3 n 1  Once        05/27/21 1512             Follow-up Information     Baxter Hire, MD Follow up in 1 week(s).   Specialty: Internal Medicine Contact information: [place] [address] [phone]                 Allergies  Allergen Reactions   Mushroom Extract Complex Nausea Only and Nausea And Vomiting    Other reaction(s): Vomiting   Promethazine     Other reaction(s): Unknown   Alprazolam     Other reaction(s): Other (See Comments)   Cefdinir Nausea Only   Ciprofloxacin     Other reaction(s): Other (See Comments)   Dilaudid [Hydromorphone Hcl]    Hydromorphone    Phenergan [Promethazine Hcl]    Pravastatin     Other reaction(s): Muscle Pain     The results of significant diagnostics from this hospitalization (including imaging, microbiology, ancillary and laboratory) are listed below for reference.   Consultations:   Procedures/Studies: DG Thoracic Spine 2 View  Result Date: 05/07/2021 CLINICAL DATA:  Mid to low back pain extending into both hips since yesterday. No acute injury. EXAM: THORACIC SPINE 2 VIEWS COMPARISON:  Chest radiographs 04/23/2020, abdominal CT 11/23/2019 and chest CT 04/20/1990. FINDINGS: There are small ribs at T12. There is a biconvex thoracolumbar  scoliosis, convex to the left at T11. Patient is status post spinal augmentation at T8. There is a new inferior endplate compression fracture at T12 which is likely acute. This results in approximately 20% loss of vertebral body height. No osseous retropulsion identified. No other acute fractures are identified. Mild lower cervical spondylosis noted. IMPRESSION: New inferior endplate compression fracture at T12, likely acute. Previous T8 spinal augmentation. Electronically Signed   By: Richardean Sale M.D.   On: 05/07/2021 13:59   DG Lumbar Spine 2-3 Views  Result Date: 05/07/2021 CLINICAL DATA:  Mid to low back pain extending into both hips since yesterday. No acute injury. EXAM: LUMBAR SPINE - 2-3 VIEW COMPARISON:  Chest radiographs 04/23/2020.  Abdominal CT  11/23/2019. FINDINGS: There are small ribs at T12 and 5 lumbar type vertebral bodies. There is a convex right scoliosis centered at L3. Acute-appearing inferior endplate compression deformity at T12 is better seen on the accompanying thoracic spine radiographs. No evidence of acute lumbar spine fracture. A mild chronic superior endplate compression deformity at L4 is stable. There is multilevel lumbar spondylosis with facet hypertrophy. Diffuse aortoiliac atherosclerosis noted. IMPRESSION: No evidence of acute lumbar spine injury. T12 inferior endplate compression deformity, likely acute. Electronically Signed   By: Richardean Sale M.D.   On: 05/07/2021 14:01      Labs: BNP (last 3 results) No results for input(s): BNP in the last 8760 hours. Basic Metabolic Panel: Recent Labs  Lab 05/25/21 1826 05/26/21 1132 05/27/21 0418  NA 132* 138 136  K 2.5* 2.8* 3.4*  CL 97* 106 105  CO2 26 24 24   GLUCOSE 140* 126* 94  BUN 15 10 10   CREATININE 0.80 0.70 0.64  CALCIUM 11.0* 9.2 8.7*  MG  --  2.1  --    Liver Function Tests: Recent Labs  Lab 05/25/21 1826  AST 22  ALT 15  ALKPHOS 72  BILITOT 0.7  PROT 7.0  ALBUMIN 3.7   No results  for input(s): LIPASE, AMYLASE in the last 168 hours. No results for input(s): AMMONIA in the last 168 hours. CBC: Recent Labs  Lab 05/25/21 1826 05/27/21 0418  WBC 6.3 7.3  HGB 12.8 12.4  HCT 38.4 37.1  MCV 87.3 87.1  PLT 342 329   Cardiac Enzymes: No results for input(s): CKTOTAL, CKMB, CKMBINDEX, TROPONINI in the last 168 hours. BNP: Invalid input(s): POCBNP CBG: No results for input(s): GLUCAP in the last 168 hours. D-Dimer No results for input(s): DDIMER in the last 72 hours. Hgb A1c No results for input(s): HGBA1C in the last 72 hours. Lipid Profile No results for input(s): CHOL, HDL, LDLCALC, TRIG, CHOLHDL, LDLDIRECT in the last 72 hours. Thyroid function studies No results for input(s): TSH, T4TOTAL, T3FREE, THYROIDAB in the last 72 hours.  Invalid input(s): FREET3 Anemia work up No results for input(s): VITAMINB12, FOLATE, FERRITIN, TIBC, IRON, RETICCTPCT in the last 72 hours. Urinalysis    Component Value Date/Time   APPEARANCEUR Hazy (A) 01/11/2019 1518   GLUCOSEU Negative 01/11/2019 1518   BILIRUBINUR Negative 01/11/2019 1518   PROTEINUR Negative 01/11/2019 1518   NITRITE Negative 01/11/2019 1518   LEUKOCYTESUR Trace (A) 01/11/2019 1518   Sepsis Labs Invalid input(s): PROCALCITONIN,  WBC,  LACTICIDVEN Microbiology No results found for this or any previous visit (from the past 240 hour(s)).   Total time spend on discharging this patient, including the last patient exam, discussing the hospital stay, instructions for ongoing care as it relates to all pertinent caregivers, as well as preparing the medical discharge records, prescriptions, and/or referrals as applicable, is 35 minutes.    Enzo Bi, MD  [facility] 05/27/2021, 4:22 PM

## 2021-05-27 NOTE — TOC Initial Note (Signed)
Transition of Care [facility]) - Initial/Assessment Note    Patient Details  Name: Laura Cole MRN: [id number] Date of Birth: 11-07-1940  [facility] CM/SW Contact:    Beverly Sessions, RN Phone Number: 05/27/2021, 3:36 PM  Clinical Narrative:                 Due to covid isolation assessment completed via phone  Admitted [id number]   Admitted from: Home Alone PCP: [place]: Daughter to pick up discharge medications and will drop them off on the patients porch after discharge   Current home health/prior home health/DME: Active with [facility] for PT  Printed resources for meals on wheels, and PCS services   Orders for home health PT and OT faxed to [place] with [facility] RW and [facility] to be delivered to room prior to discharge  Due to patient being covid positive her family is unable to transport.  Arranged transport through door to door [facility] transport    459 pmUpdate:  DME to be delivered to home.  EMS transport called   Expected Discharge Plan: [place] Barriers to Discharge: Barriers Resolved   Patient Goals and CMS Choice        Expected Discharge Plan and Services Expected Discharge Plan: [place]   Discharge Planning Services: CM Consult   Living arrangements for the past 2 months: Apartment Expected Discharge Date: 05/27/21               DME Arranged: Berta Minor rolling DME Agency: [employer] Date DME Agency Contacted: 05/27/21   Representative spoke with at DME Agency: Faythe Dingwall HH Arranged: PT, OT [place] Agency: [place] Date [place]: 05/27/21   Representative spoke with at [place]: [place]  Prior Living Arrangements/Services Living arrangements for the past 2 months: [place] with:: Self Patient language and need for interpreter reviewed:: Yes Do you feel safe going back to the place where you live?: Yes      Need for Family Participation in Patient  Care: Yes (Comment) Care giver support system in place?: Yes (comment) Current home services: Home PT Criminal Activity/Legal Involvement Pertinent to Current Situation/Hospitalization: No - Comment as needed  Activities of Daily Living Home Assistive Devices/Equipment: Cane (specify quad or straight) ADL Screening (condition at time of admission) Patient's cognitive ability adequate to safely complete daily activities?: Yes Is the patient deaf or have difficulty hearing?: Yes Does the patient have difficulty seeing, even when wearing glasses/contacts?: No Does the patient have difficulty concentrating, remembering, or making decisions?: Yes Patient able to express need for assistance with ADLs?: Yes Does the patient have difficulty dressing or bathing?: No Independently performs ADLs?: Yes (appropriate for developmental age) Does the patient have difficulty walking or climbing stairs?: Yes Weakness of Legs: Both Weakness of Arms/Hands: Both  Permission Sought/Granted                  Emotional Assessment       Orientation: : Oriented to Self, Oriented to Place, Oriented to  Time, Oriented to Situation Alcohol / Substance Use: Not Applicable Psych Involvement: No (comment)  Admission diagnosis:  Hypokalemia [E87.6] Generalized weakness [R53.1] Diarrhea, unspecified type [R19.7] Diarrhea due to COVID-19 [U07.1, A08.39] Gastroenteritis due to COVID-19 virus [U07.1, A08.39] COVID-19 [U07.1] Patient Active Problem List   Diagnosis Date Noted   Gastroenteritis due to COVID-19 virus 05/26/2021   Diarrhea due to COVID-19 05/26/2021   Hypokalemia 05/26/2021  Hyponatremia 05/26/2021   PCP:  Baxter Hire, MD Pharmacy:   [facility] [phone], [place] [address] Phone: [phone] Fax: [phone]     Social Determinants of Health (SDOH) Interventions     Readmission Risk Interventions No flowsheet data found.

## 2021-05-27 NOTE — Plan of Care (Signed)

## 2021-05-29 ENCOUNTER — Ambulatory Visit: Payer: Medicare Other | Admitting: Physical Therapy

## 2021-06-03 ENCOUNTER — Ambulatory Visit: Payer: Medicare Other | Admitting: Physical Therapy

## 2021-06-05 ENCOUNTER — Ambulatory Visit: Payer: Medicare Other | Admitting: Physical Therapy

## 2021-06-10 ENCOUNTER — Ambulatory Visit: Payer: Medicare Other | Admitting: Physical Therapy

## 2021-06-12 ENCOUNTER — Ambulatory Visit: Payer: Medicare Other | Admitting: Physical Therapy

## 2021-06-17 ENCOUNTER — Ambulatory Visit: Payer: Medicare Other | Admitting: Physical Therapy

## 2021-06-19 ENCOUNTER — Ambulatory Visit: Payer: Medicare Other | Admitting: Physical Therapy

## 2021-07-09 ENCOUNTER — Other Ambulatory Visit: Payer: Self-pay

## 2021-07-09 ENCOUNTER — Emergency Department
Admission: EM | Admit: 2021-07-09 | Discharge: 2021-07-09 | Disposition: A | Discharge status: Home / Self Care | Payer: Medicare Other | Attending: Emergency Medicine | Admitting: Emergency Medicine

## 2021-07-09 ENCOUNTER — Emergency Department: Payer: Medicare Other

## 2021-07-09 DIAGNOSIS — I2510 Atherosclerotic heart disease of native coronary artery without angina pectoris: Secondary | ICD-10-CM | POA: Insufficient documentation

## 2021-07-09 DIAGNOSIS — R0602 Shortness of breath: Secondary | ICD-10-CM | POA: Insufficient documentation

## 2021-07-09 DIAGNOSIS — I119 Hypertensive heart disease without heart failure: Secondary | ICD-10-CM | POA: Diagnosis not present

## 2021-07-09 DIAGNOSIS — R002 Palpitations: Secondary | ICD-10-CM | POA: Diagnosis not present

## 2021-07-09 LAB — URINALYSIS, ROUTINE W REFLEX MICROSCOPIC
Bilirubin Urine: NEGATIVE
Glucose, UA: NEGATIVE mg/dL
Hgb urine dipstick: NEGATIVE
Ketones, ur: NEGATIVE mg/dL
Nitrite: NEGATIVE
Protein, ur: NEGATIVE mg/dL
Specific Gravity, Urine: 1.011 (ref 1.005–1.030)
pH: 5 (ref 5.0–8.0)

## 2021-07-09 LAB — BASIC METABOLIC PANEL
Anion gap: 10 (ref 5–15)
BUN: 9 mg/dL (ref 8–23)
CO2: 25 mmol/L (ref 22–32)
Calcium: 10.4 mg/dL — ABNORMAL HIGH (ref 8.9–10.3)
Chloride: 103 mmol/L (ref 98–111)
Creatinine, Ser: 0.86 mg/dL (ref 0.44–1.00)
GFR, Estimated: >60 mL/min (ref >60)
Glucose, Bld: 128 mg/dL — ABNORMAL HIGH (ref 70–99)
Potassium: 3.6 mmol/L (ref 3.5–5.1)
Sodium: 138 mmol/L (ref 135–145)

## 2021-07-09 LAB — CBC
HCT: 39.1 % (ref 36.0–46.0)
Hemoglobin: 12.5 g/dL (ref 12.0–15.0)
MCH: 29.3 pg (ref 26.0–34.0)
MCHC: 32 g/dL (ref 30.0–36.0)
MCV: 91.8 fL (ref 80.0–100.0)
Platelets: 317 10*3/uL (ref 150–400)
RBC: 4.26 MIL/uL (ref 3.87–5.11)
RDW: 15 % (ref 11.5–15.5)
WBC: 6.9 10*3/uL (ref 4.0–10.5)
nRBC: 0 % (ref 0.0–0.2)

## 2021-07-09 LAB — TROPONIN I (HIGH SENSITIVITY): Troponin I (High Sensitivity): 6 ng/L (ref <18)

## 2021-07-09 IMAGING — CR DG CHEST 2V
1 series · 2 of 2 positions shown · non-contrast
Comparison: [DATE]

CLINICAL DATA: Shortness of breath

EXAM:
CHEST - 2 VIEW

[Series 1: dg chest 2 view · 0.14mm/px · 2 of 2 slices shown]
[im 1/2]
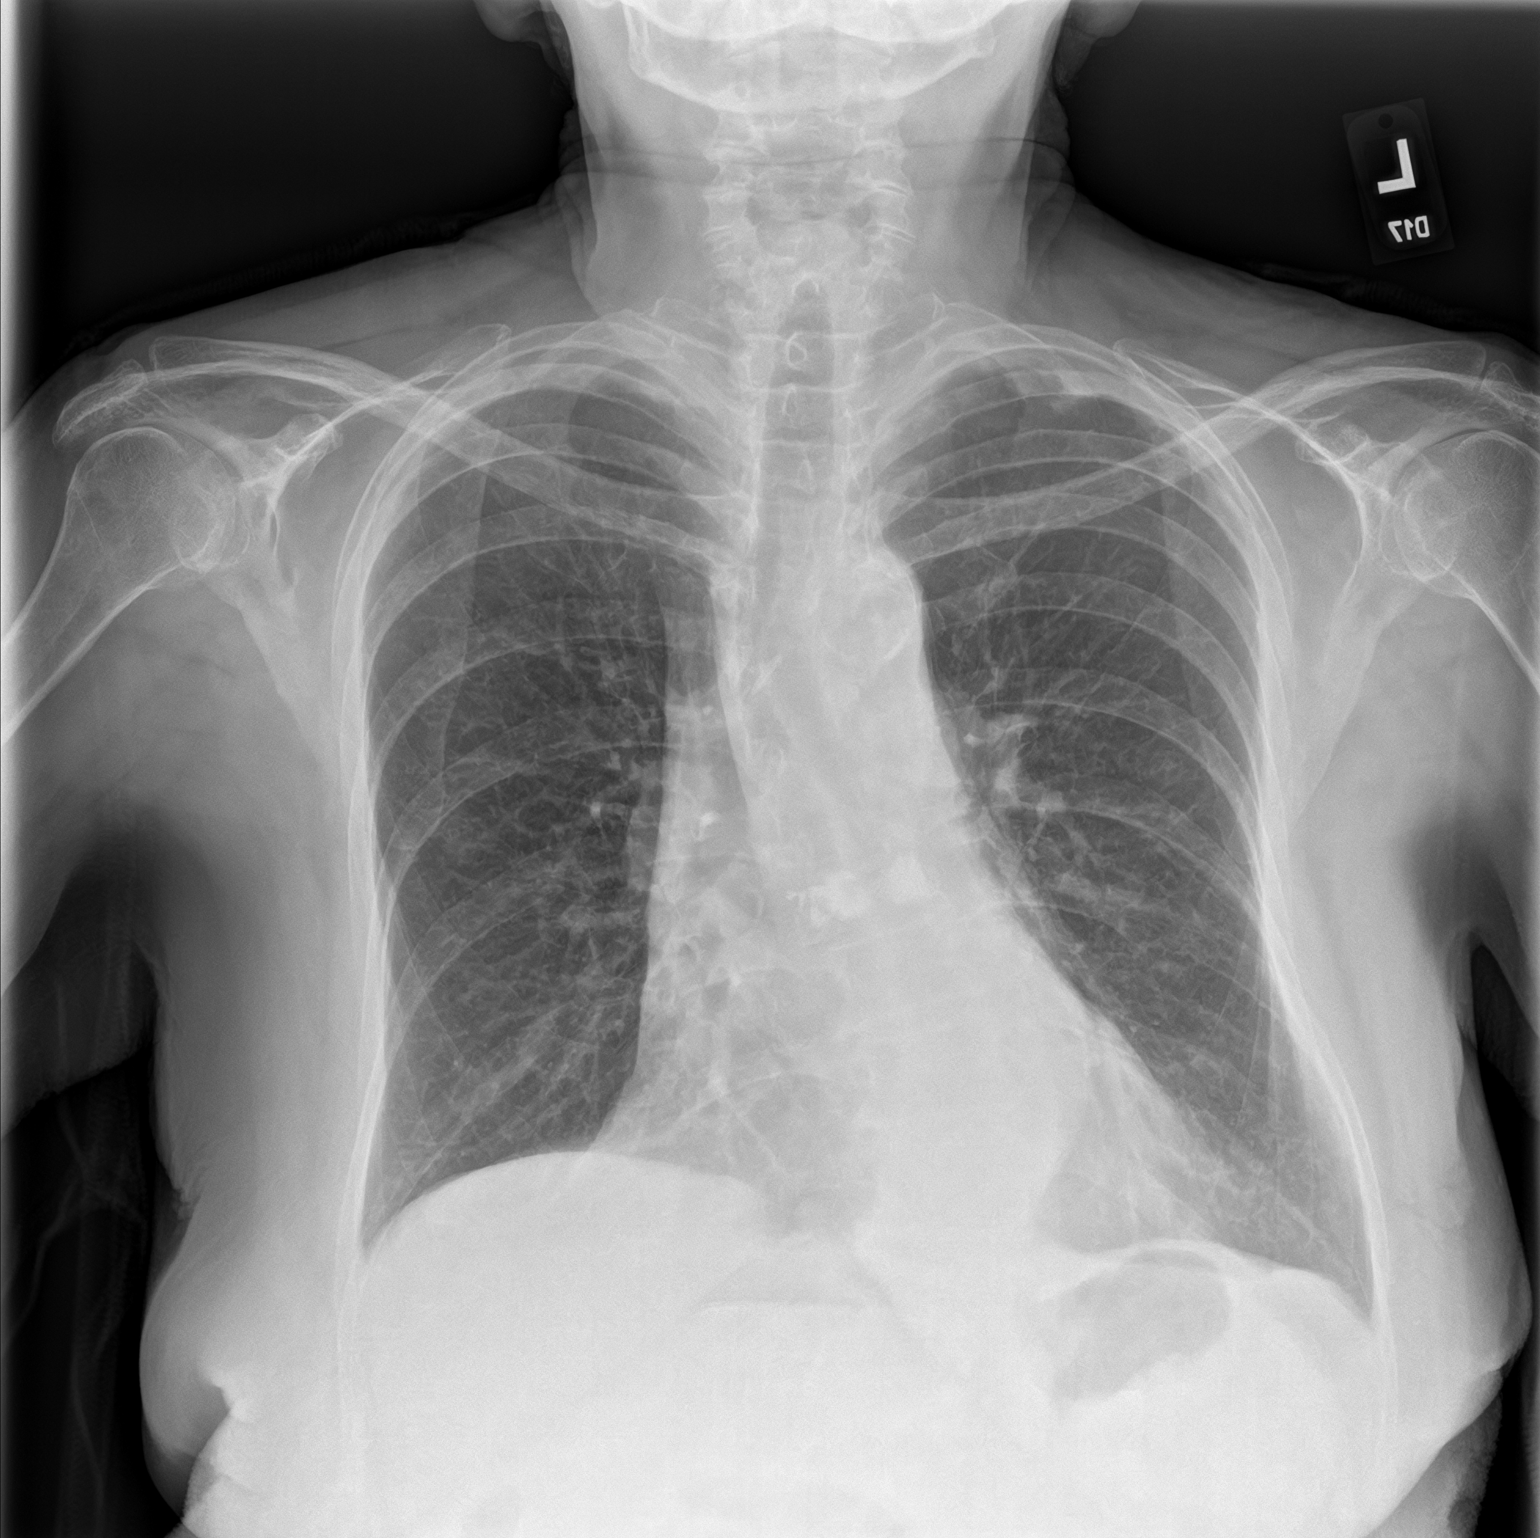
[im 2/2]
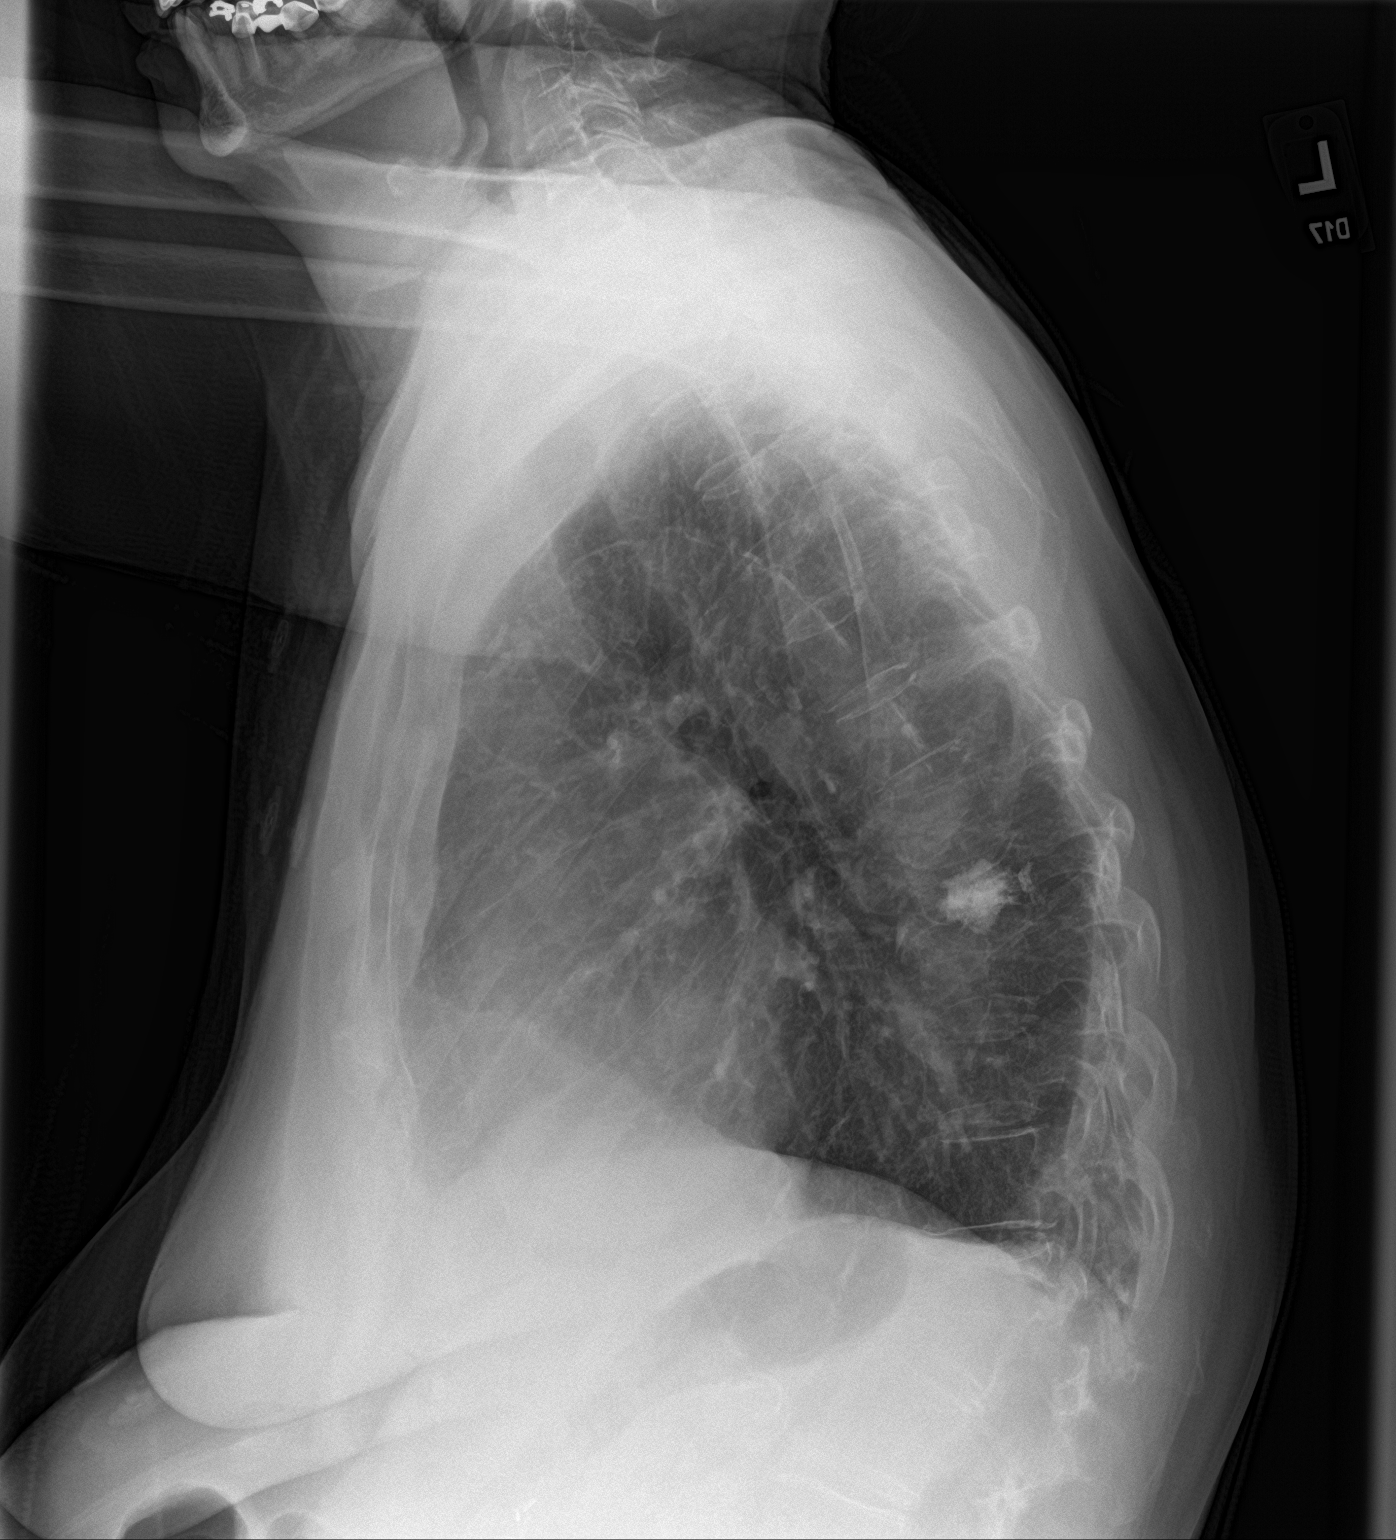

[2 of 2 positions shown; findings below may reference images not displayed]

FINDINGS: No focal consolidation. No pleural effusion or pneumothorax. Heart
and mediastinal contours are unremarkable.

No acute osseous abnormality. Prior midthoracic vertebral body
augmentation. Levoscoliosis of the thoracolumbar spine.
IMPRESSION: No active cardiopulmonary disease.

## 2021-07-09 NOTE — ED Provider Notes (Signed)
[facility] Provider Note    Event Date/Time   First MD Initiated Contact with Patient 07/09/21 1140     (approximate)   History   Shortness of Breath   HPI  Laura Cole is a 81 y.o. female who presents to the ED for evaluation of Shortness of Breath   I review walk-in clinic visit from earlier this morning the patient was seen for palpitations.  Referred to [place] for evaluation. I reviewed outpatient [facility] clinic cardiology visit from November 2022 for patient was seen for palpitations.  Has not chronically without indication of dysrhythmia.  She has a history otherwise of HTN, CAD, HLD and sinus pericardia.  Patient presents to the ED, accompanied by her daughter, for evaluation of palpitations.  She reports these are reminiscent of the past 10+ years of palpitations that she has had chronically, but seemed more severe or faster.  She reports no significant chest pain, syncope, dizziness.  Denies emesis or abdominal pain.  Reports tolerating p.o. intake and toileting at her baseline.  No weakness to the extremities.   Physical Exam   Triage Vital Signs: ED Triage Vitals [07/09/21 1001]  Enc Vitals Group     BP 134/65     Pulse Rate 65     Resp 18     Temp 98 F (36.7 C)     Temp src      SpO2 98 %     Weight      Height      Head Circumference      Peak Flow      Pain Score      Pain Loc      Pain Edu?      Excl. in [place]?     Most recent vital signs: Vitals:   07/09/21 1001  BP: 134/65  Pulse: 65  Resp: 18  Temp: 98 F (36.7 C)  SpO2: 98%    General: Awake, no distress.  CV:  Good peripheral perfusion. RRR Resp:  Normal effort. CTAB Abd:  No distention.  MSK:  No deformity noted.  Neuro:  No focal deficits appreciated. Cranial nerves II through XII intact 5/5 strength and sensation in all 4 extremities Other:     ED Results / Procedures / Treatments   Labs (all labs ordered are listed, but only abnormal results are  displayed) Labs Reviewed  BASIC METABOLIC PANEL - Abnormal; Notable for the following components:      Result Value   Glucose, Bld 128 (*)    Calcium 10.4 (*)    All other components within normal limits  URINALYSIS, ROUTINE W REFLEX MICROSCOPIC - Abnormal; Notable for the following components:   Color, Urine YELLOW (*)    APPearance HAZY (*)    Leukocytes,Ua SMALL (*)    Bacteria, UA RARE (*)    All other components within normal limits  CBC  TROPONIN I (HIGH SENSITIVITY)  TROPONIN I (HIGH SENSITIVITY)    EKG Sinus rhythm with a rate of 64 bpm.  Normal axis . Incomplete right bundle and otherwise normal intervals.  Nonspecific inferior ST changes without STEMI. Similar to EKG from last month  RADIOLOGY CXR reviewed by me without evidence of acute cardiopulmonary pathology.  Official radiology report(s): DG Chest 2 View  Result Date: 07/09/2021 CLINICAL DATA:  Shortness of breath EXAM: CHEST - 2 VIEW COMPARISON:  05/13/2020 FINDINGS: No focal consolidation. No pleural effusion or pneumothorax. Heart and mediastinal contours are unremarkable. No acute osseous abnormality.  Prior midthoracic vertebral body augmentation. Levoscoliosis of the thoracolumbar spine. IMPRESSION: No active cardiopulmonary disease. Electronically Signed   By: Kathreen Devoid M.D.   On: 07/09/2021 10:21    PROCEDURES and INTERVENTIONS:  .1-3 Lead EKG Interpretation Performed by: Vladimir Crofts, MD Authorized by: Vladimir Crofts, MD     Interpretation: normal     ECG rate:  60   ECG rate assessment: normal     Rhythm: sinus rhythm     Ectopy: none     Conduction: normal    Medications - No data to display   IMPRESSION / MDM / [place] / ED COURSE  I reviewed the triage vital signs and the nursing notes.  81 year old woman presents to the ED with chronic palpitations without evidence of acute pathology.  Normal vitals on room air.  Reassuring examination without evidence of neurologic or  vascular deficits.  No cardiac murmurs appreciated.  Work-up is benign with a nonischemic EKG, negative troponin, normal electrolytes.  Urine without infectious features.  EKG nonischemic.  Has a couple PACs, which may be symptomatic.  Considering her extensive cardiac work-up, while I considered an observation admission I do not think this is prudent.  We will discharge with return precautions.      FINAL CLINICAL IMPRESSION(S) / ED DIAGNOSES   Final diagnoses:  Palpitations     Rx / DC Orders   ED Discharge Orders     None        Note:  This document was prepared using Dragon voice recognition software and may include unintentional dictation errors.   Vladimir Crofts, MD 07/09/21 1257

## 2021-07-09 NOTE — ED Notes (Signed)
First Nurse Note:  Pt to ED from [facility] in for palpitations. Pt is in NAD.

## 2021-07-09 NOTE — ED Triage Notes (Signed)
Pt comes with c/o feeling like her heart is skipping beats. Pt states some SOB. Pt denies any CP.  Pt states this all started week ago.

## 2021-07-12 ENCOUNTER — Other Ambulatory Visit: Payer: Self-pay | Admitting: Neurosurgery

## 2021-07-12 DIAGNOSIS — S22080D Wedge compression fracture of T11-T12 vertebra, subsequent encounter for fracture with routine healing: Secondary | ICD-10-CM

## 2021-07-23 ENCOUNTER — Ambulatory Visit
Admission: RE | Admit: 2021-07-23 | Discharge: 2021-07-23 | Disposition: A | Discharge status: Home / Self Care | Payer: Medicare Other | Source: Ambulatory Visit | Attending: Neurosurgery | Admitting: Neurosurgery

## 2021-07-23 DIAGNOSIS — S22080D Wedge compression fracture of T11-T12 vertebra, subsequent encounter for fracture with routine healing: Secondary | ICD-10-CM | POA: Diagnosis not present

## 2021-07-23 IMAGING — MR MR THORACIC SPINE W/O CM
5 of 6 series · 31 of 48 positions shown · non-contrast
Comparison: Radiography [DATE].

CLINICAL DATA: Bending injury on [DATE].  Compression fracture.

EXAM:
MRI THORACIC SPINE WITHOUT CONTRAST
TECHNIQUE: Multiplanar, multisequence MR imaging of the thoracic spine was
performed. No intravenous contrast was administered.

[Series 16: T1 · sagittal · 5.0mm · 1.88mm/px · 4 of 9 slices shown (1 of 2)]
[im 1/9]
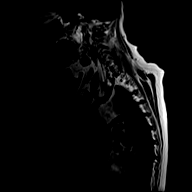
[im 3/9]
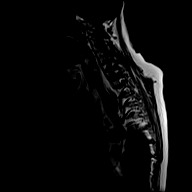
[im 6/9]
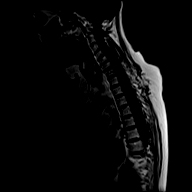
[im 9/9]
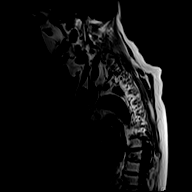

[Series 17: T2 · sagittal · 3.0mm · 1.06mm/px · 6 of 19 slices shown (1 of 2)]
[im 1/19]
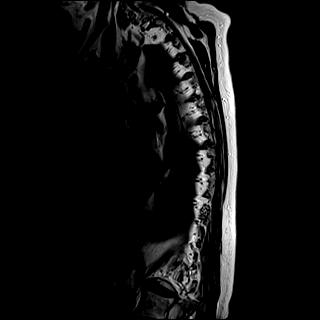
[im 4/19]
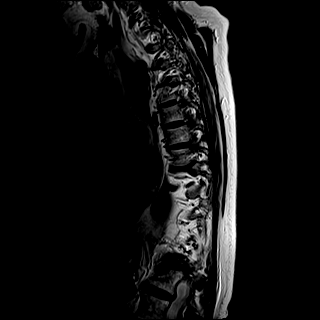
[im 8/19]
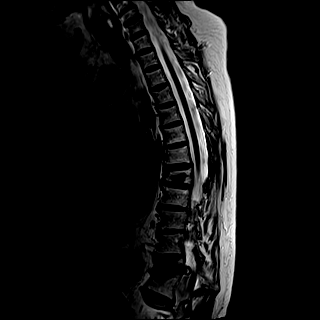
[im 11/19]
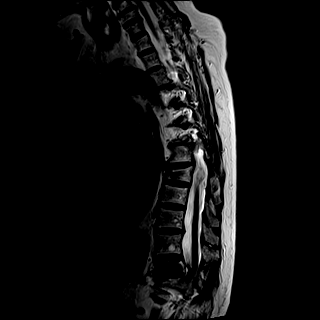
[im 15/19]
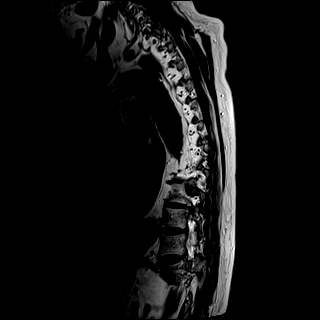
[im 19/19]
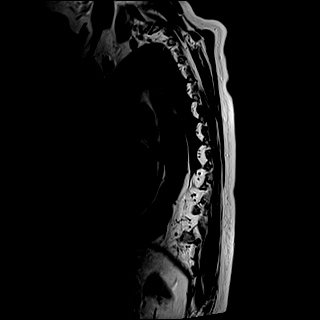

[Series 18: T1 · sagittal · 3.0mm · 1.06mm/px · 6 of 19 slices shown (2 of 2)]
[im 1/19]
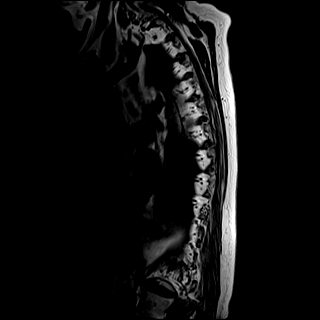
[im 4/19]
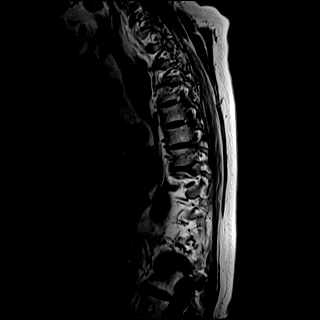
[im 8/19]
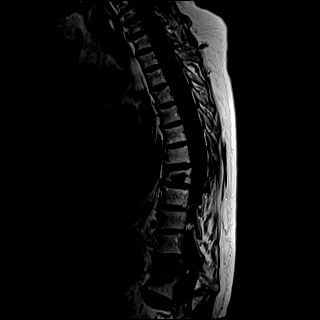
[im 11/19]
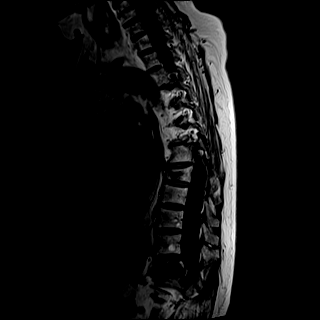
[im 15/19]
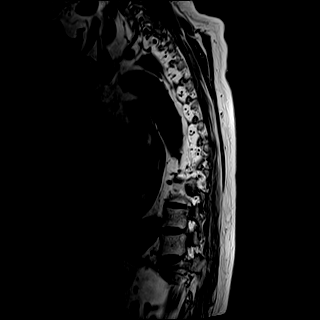
[im 19/19]
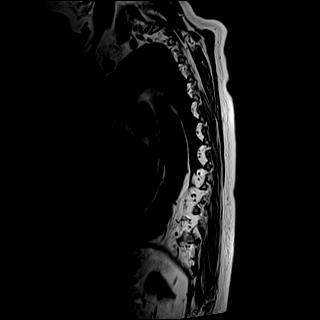

[Series 19: STIR · sagittal · 3.0mm · 0.53mm/px · 6 of 19 slices shown]
[im 1/19]
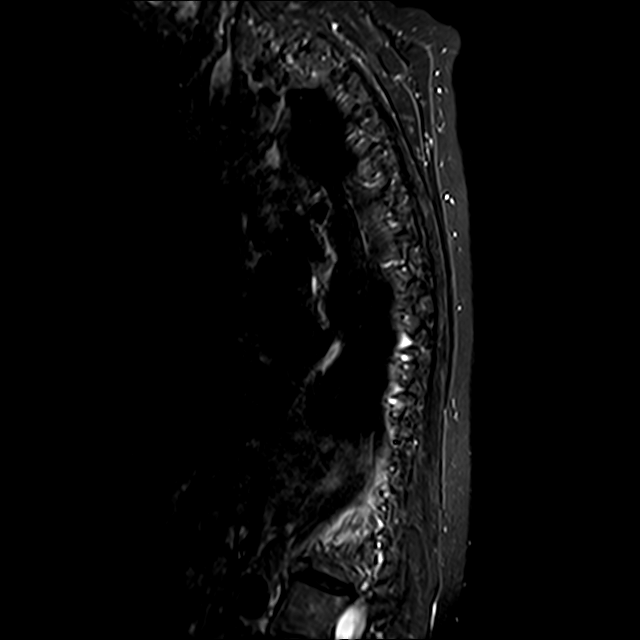
[im 4/19]
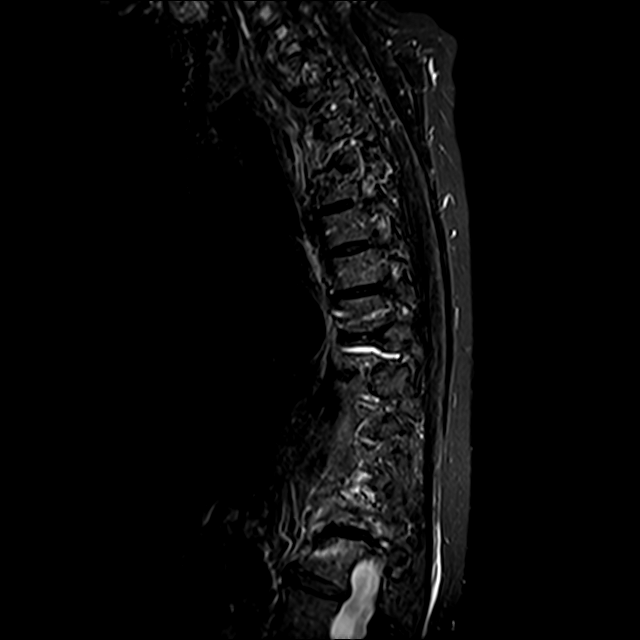
[im 8/19]
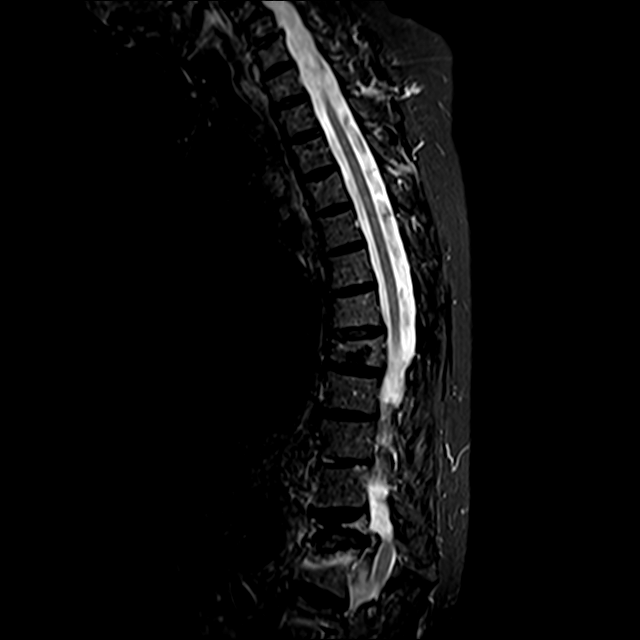
[im 11/19]
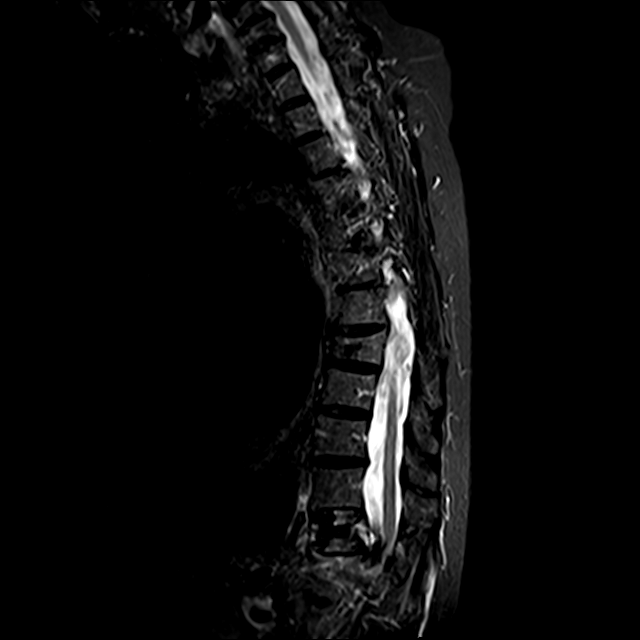
[im 15/19]
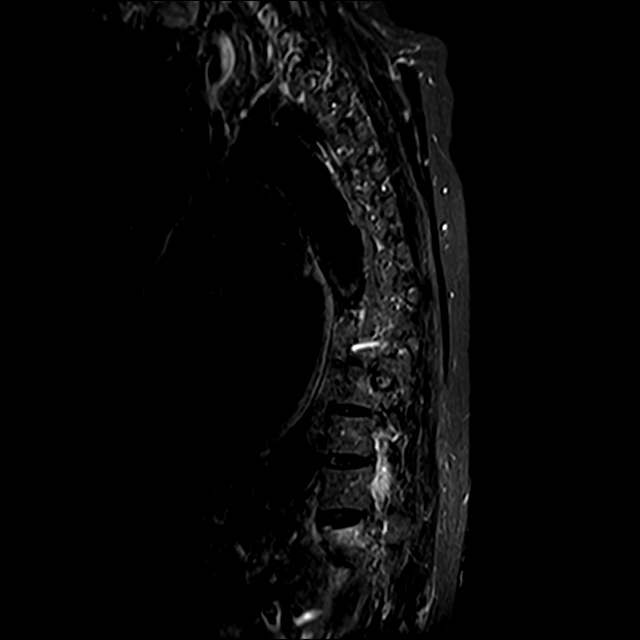
[im 19/19]
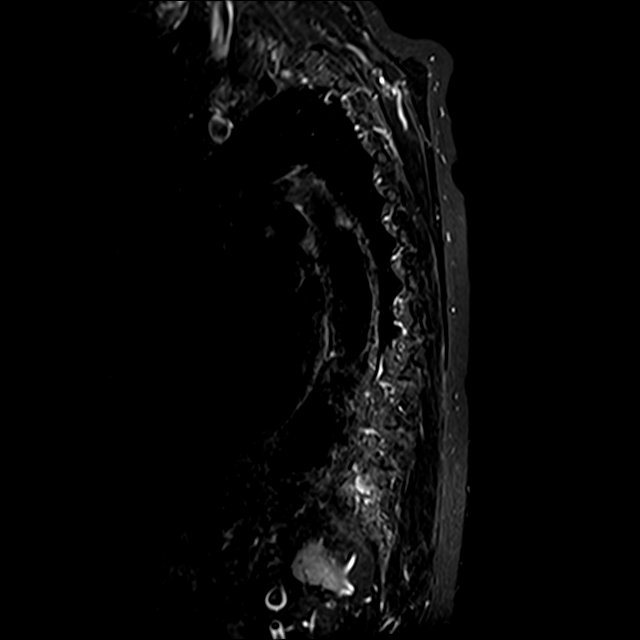

[Series 20: T2 · axial · 4.0mm · 0.59mm/px · z∈[-316,-106]mm · 9 of 39 slices shown (2 of 2)]
[im 1/39]
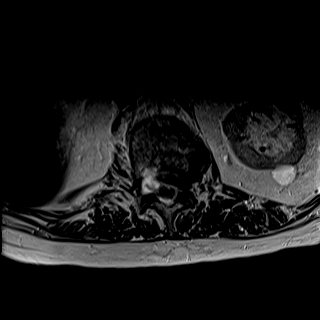
[im 7/39]
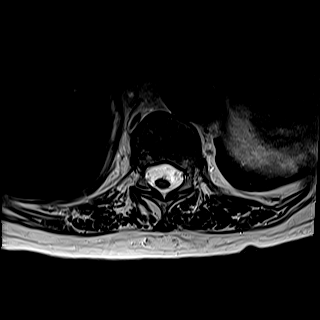
[im 13/39]
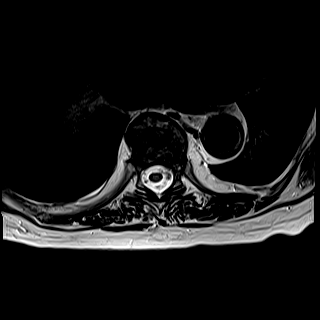
[im 16/39]
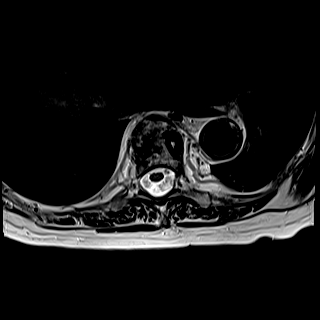
[im 20/39]
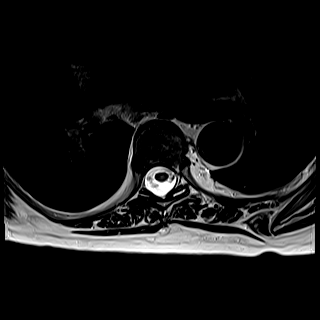
[im 23/39]
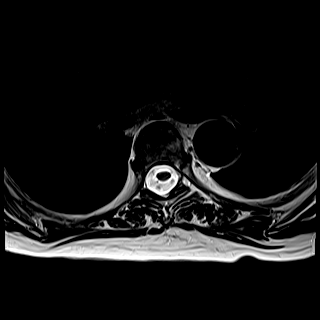
[im 26/39]
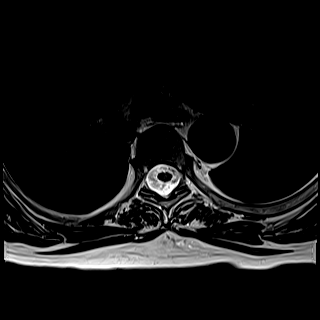
[im 32/39]
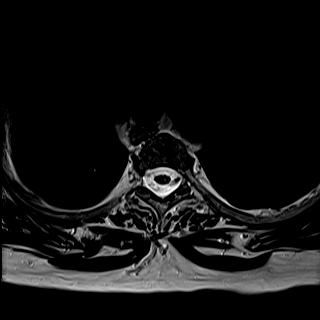
[im 39/39]
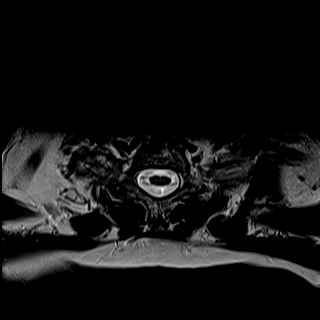

[31 of 48 positions shown; findings below may reference images not displayed]

FINDINGS: Alignment: Scoliotic curvature convex to the right with the apex at
T6.

Vertebrae: Old minor superior endplate depression at T4. Old healed
augmented fracture at T8 with loss of height of 30%. Progression of
the previously seen acute fracture at T12 with loss of height of 70%
presently and retropulsion and or disc extrusion extending 5 mm
posteriorly, encroaching upon the spinal canal, with effacement of
the subarachnoid space and slight indentation of the cord. Superior
endplate fracture anteriorly at L1 with loss of height 20%
anteriorly but no posterior vertebral body involvement.

Cord: No primary cord lesion. See above for description of the T12
level.

Paraspinal and other soft tissues: Aortic atherosclerosis. Otherwise
negative.

Disc levels:

No significant disc level pathology from T1-2 through T11-12.
Minimal non-compressive disc bulges.

As noted above, at T12 and T12-L1, there is 5 mm retropulsion of the
inferior endplate and probably some associated disc material which
encroaches upon the spinal canal, effacing the ventral subarachnoid
space and indenting the ventral cord slightly.
IMPRESSION: Progression of the previously seen compression fracture at T12, now
with loss of height of 70%. Inferior endplate shows retropulsion and
possibly associated herniation of the T12-L1 disc by a distance of 5
mm, encroaching upon the spinal canal, effacing the ventral
subarachnoid space and indenting the ventral cord slightly.

New superior endplate fracture anteriorly at L1 with loss of height
anteriorly of 20%. No involvement of the posterior aspect of the
vertebral body.

## 2021-07-26 ENCOUNTER — Other Ambulatory Visit: Payer: Self-pay | Admitting: Neurosurgery

## 2021-07-26 DIAGNOSIS — S22080D Wedge compression fracture of T11-T12 vertebra, subsequent encounter for fracture with routine healing: Secondary | ICD-10-CM

## 2021-07-30 ENCOUNTER — Ambulatory Visit
Admission: RE | Admit: 2021-07-30 | Discharge: 2021-07-30 | Disposition: A | Discharge status: Home / Self Care | Payer: Medicare Other | Source: Ambulatory Visit | Attending: Neurosurgery | Admitting: Neurosurgery

## 2021-07-30 DIAGNOSIS — S22080D Wedge compression fracture of T11-T12 vertebra, subsequent encounter for fracture with routine healing: Secondary | ICD-10-CM

## 2021-07-30 HISTORY — PX: IR RADIOLOGIST EVAL & MGMT: IMG5224

## 2021-07-30 NOTE — Consult Note (Signed)
? ? ?Chief Complaint: ?Patient was seen in consultation today for thoracolumbar spine pain at the request of Laura Cole ? ?Referring Physician(s): ?Loleta Dicker ? ?History of Present Illness: ?Laura Cole is a 81 y.o. female who presents with a recent exacerbation of ongoing back pain since suffering a T12 compression fracture on May 07, 2021.  Laura Cole was sitting on her bed on 20 December putting on her compression hose when she felt a sudden pop in her back followed by acute pain.  She recognized the sensation from a prior T8 compression fracture she had many years prior.  She underwent cement augmentation with kyphoplasty for the T8 fracture approximately 16 years ago.  While the fracture pain was resolved, she has since found it very uncomfortable to wear a bra and associates this with the kyphoplasty procedure.  For this reason, she initially sought conservative management.  She has a brace which she wears when she can tolerate it which is not very often.  She was beginning to feel better until approximately 3 weeks ago when she had a sudden onset of severe exacerbation of her pain which is not getting any better.  Recent MRI imaging demonstrates a subacute to chronic fracture at T12 with vertebral plana appearance and approximately 5 mm of posterior retropulsion effacing the anterior CSF space.  ? ?Additionally, there is a more acute appearing fracture involving the superior endplate of L1 with approximately 20% height loss anteriorly with no evidence of retropulsion. ? ?She reports that her pain is largely in her lower back and is quite severe.  Her pain is a 6 out of 10 on a 10 point scale nearly all the time.  However, if she tries to stand for too long, say for example to make herself breakfast, the pain escalates to an 8 out of 10 and she has to sit down and rest before she is able to resume making breakfast.  Her pain is also fairly debilitating.  She scored a 12 out of 24 on the  [employer] disability questionnaire.  Her son is currently paying for her to have in-home help 4 hours a day 5 days a week.  Her aide helps her with cooking, cleaning and performing her other activities of daily living.  She had her first shower in weeks yesterday and was just able to tolerate it. ? ? ? ?Past Medical History:  ?Diagnosis Date  ? Bradycardia   ? Cancer [facility])   ? skin cancer  ? Coronary artery disease   ? Diverticulitis   ? GERD (gastroesophageal reflux disease)   ? Heart murmur   ? Hemorrhoids   ? Hypertension   ? Hypothyroidism   ? Osteoarthritis   ? Peripheral vascular disease ([place])   ? TIA (transient ischemic attack)   ? ? ?Past Surgical History:  ?Procedure Laterality Date  ? ABDOMINAL HYSTERECTOMY    ? BACK SURGERY    ? CHOLECYSTECTOMY    ? COLON SURGERY    ? COLONOSCOPY WITH PROPOFOL N/A 12/05/2019  ? Procedure: COLONOSCOPY WITH PROPOFOL;  Surgeon: Lesly Rubenstein, MD;  Location[facility] ENDOSCOPY;  Service: Endoscopy;  Laterality: N/A;  ? ESOPHAGOGASTRODUODENOSCOPY (EGD) WITH PROPOFOL N/A 12/05/2019  ? Procedure: ESOPHAGOGASTRODUODENOSCOPY (EGD) WITH PROPOFOL;  Surgeon: Lesly Rubenstein, MD;  Location: [facility] ENDOSCOPY;  Service: Endoscopy;  Laterality: N/A;  ? EYE SURGERY    ? IR RADIOLOGIST EVAL & MGMT  07/30/2021  ? RECTOCELE REPAIR    ? RECTOCELE REPAIR    ?  REPLACEMENT TOTAL KNEE Bilateral   ? TKRX2 Bilateral   ? WRIST SURGERY    ? ? ?Allergies: ?Mushroom extract complex, Promethazine, Alprazolam, Cefdinir, Ciprofloxacin, Dilaudid [hydromorphone hcl], Hydromorphone, Phenergan [promethazine hcl], and Pravastatin ? ?Medications: ?Prior to Admission medications   ?Medication Sig Start Date End Date Taking? Authorizing Provider  ?amLODipine (NORVASC) 5 MG tablet Take 1 tablet (5 mg total) by mouth 2 (two) times daily. Home med. 05/27/21  Yes Enzo Bi, MD  ?baclofen (LIORESAL) 10 MG tablet Take 10 mg by mouth 3 (three) times daily as needed for muscle spasms.   Yes [provider]   ?levothyroxine (SYNTHROID) 50 MCG tablet Take 50 mcg by mouth daily before breakfast.   Yes [provider]  ?metoprolol tartrate (LOPRESSOR) 25 MG tablet Take 12.5 mg by mouth 2 (two) times daily.   Yes [provider]  ?pantoprazole (PROTONIX) 20 MG tablet Take 20 mg by mouth 2 (two) times daily.   Yes [provider]  ?ondansetron (ZOFRAN-ODT) 4 MG disintegrating tablet Take 4 mg by mouth every 8 (eight) hours as needed for nausea/vomiting. ?Patient not taking: Reported on 07/30/2021    [provider]  ?  ? ?Family History  ?Problem Relation Age of Onset  ? Breast cancer Sister 8  ? ? ?Social History  ? ?Socioeconomic History  ? Marital status: Single  ?  Spouse name: Not on file  ? Number of children: Not on file  ? Years of education: Not on file  ? Highest education level: Not on file  ?Occupational History  ? Not on file  ?Tobacco Use  ? Smoking status: Never  ? Smokeless tobacco: Never  ?Vaping Use  ? Vaping Use: Never used  ?Substance and Sexual Activity  ? Alcohol use: Not Currently  ? Drug use: Never  ? Sexual activity: Not Currently  ?  Birth control/protection: Post-menopausal  ?Other Topics Concern  ? Not on file  ?Social History Narrative  ? Not on file  ? ?Social Determinants of Health  ? ?Financial Resource Strain: Not on file  ?Food Insecurity: Not on file  ?Transportation Needs: Not on file  ?Physical Activity: Not on file  ?Stress: Not on file  ?Social Connections: Not on file  ? ? ? ?Review of Systems: A 12 point ROS discussed and pertinent positives are indicated in the HPI above.  All other systems are negative. ? ?Review of Systems ? ?Vital Signs: ?BP (!) 141/58   Pulse 64   LMP  (LMP Unknown)   SpO2 93%  ? ?Physical Exam ?Constitutional:   ?   Appearance: Normal appearance.  ?HENT:  ?   Head: Normocephalic and atraumatic.  ?Eyes:  ?   General: No scleral icterus. ?Cardiovascular:  ?   Rate and Rhythm: Normal rate.  ?Pulmonary:  ?   Effort: Pulmonary  effort is normal.  ?Musculoskeletal:  ?     Back: ? ?   Comments: TTP overlying the L1 spinous process ?  ?Skin: ?   General: Skin is warm and dry.  ?Neurological:  ?   General: No focal deficit present.  ?   Mental Status: She is alert and oriented to person, place, and time.  ?Psychiatric:     ?   Mood and Affect: Mood normal.     ?   Behavior: Behavior normal.  ? ? ? ? ?Imaging: ?DG Chest 2 View ? ?Result Date: 07/09/2021 ?CLINICAL DATA:  Shortness of breath EXAM: CHEST - 2 VIEW COMPARISON:  05/13/2020 FINDINGS: No focal consolidation. No pleural effusion or pneumothorax. Heart and mediastinal contours are unremarkable. No acute osseous abnormality. Prior midthoracic vertebral body augmentation. Levoscoliosis of the thoracolumbar spine. IMPRESSION: No active cardiopulmonary disease. Electronically Signed   By: Kathreen Devoid M.D.   On: 07/09/2021 10:21  ? ?MR THORACIC SPINE WO CONTRAST ? ?Result Date: 07/23/2021 ?CLINICAL DATA:  Bending injury on 05/07/2021.  Compression fracture. EXAM: MRI THORACIC SPINE WITHOUT CONTRAST TECHNIQUE: Multiplanar, multisequence MR imaging of the thoracic spine was performed. No intravenous contrast was administered. COMPARISON:  Radiography 05/07/2021. FINDINGS: Alignment: Scoliotic curvature convex to the right with the apex at T6. Vertebrae: Old minor superior endplate depression at T4. Old healed augmented fracture at T8 with loss of height of 30%. Progression of the previously seen acute fracture at T12 with loss of height of 70% presently and retropulsion and or disc extrusion extending 5 mm posteriorly, encroaching upon the spinal canal, with effacement of the subarachnoid space and slight indentation of the cord. Superior endplate fracture anteriorly at L1 with loss of height 20% anteriorly but no posterior vertebral body involvement. Cord: No primary cord lesion. See above for description of the T12 level. Paraspinal and other soft tissues: Aortic atherosclerosis. Otherwise  negative. Disc levels: No significant disc level pathology from T1-2 through T11-12. Minimal non-compressive disc bulges. As noted above, at T12 and T12-L1, there is 5 mm retropulsion of the inferior endplate an

## 2021-08-01 ENCOUNTER — Other Ambulatory Visit: Payer: Self-pay | Admitting: Interventional Radiology

## 2021-08-09 ENCOUNTER — Other Ambulatory Visit: Payer: Self-pay | Admitting: Student

## 2021-08-09 NOTE — Progress Notes (Signed)
Spoke with patient 08/09/21 @ 12:15 pm. Need to arrive at 7:30 for 8:30 procedure, NPO after midnight and need for driver post procedure reviewed with patient. Patient verbalized understanding and stated that her son would be accompanying her for her procedure.  ?

## 2021-08-12 ENCOUNTER — Encounter: Payer: Self-pay | Admitting: Radiology

## 2021-08-12 ENCOUNTER — Other Ambulatory Visit: Payer: Self-pay

## 2021-08-12 ENCOUNTER — Ambulatory Visit
Admission: RE | Admit: 2021-08-12 | Discharge: 2021-08-12 | Disposition: A | Discharge status: Home / Self Care | Payer: Medicare Other | Source: Ambulatory Visit | Attending: Interventional Radiology | Admitting: Interventional Radiology

## 2021-08-12 DIAGNOSIS — I10 Essential (primary) hypertension: Secondary | ICD-10-CM | POA: Diagnosis not present

## 2021-08-12 DIAGNOSIS — S32019A Unspecified fracture of first lumbar vertebra, initial encounter for closed fracture: Secondary | ICD-10-CM | POA: Insufficient documentation

## 2021-08-12 DIAGNOSIS — I739 Peripheral vascular disease, unspecified: Secondary | ICD-10-CM | POA: Diagnosis not present

## 2021-08-12 DIAGNOSIS — M8008XA Age-related osteoporosis with current pathological fracture, vertebra(e), initial encounter for fracture: Secondary | ICD-10-CM | POA: Diagnosis not present

## 2021-08-12 DIAGNOSIS — X58XXXA Exposure to other specified factors, initial encounter: Secondary | ICD-10-CM | POA: Insufficient documentation

## 2021-08-12 DIAGNOSIS — Z79890 Hormone replacement therapy (postmenopausal): Secondary | ICD-10-CM | POA: Insufficient documentation

## 2021-08-12 DIAGNOSIS — Z79899 Other long term (current) drug therapy: Secondary | ICD-10-CM | POA: Diagnosis not present

## 2021-08-12 DIAGNOSIS — E039 Hypothyroidism, unspecified: Secondary | ICD-10-CM | POA: Diagnosis not present

## 2021-08-12 DIAGNOSIS — I2510 Atherosclerotic heart disease of native coronary artery without angina pectoris: Secondary | ICD-10-CM | POA: Insufficient documentation

## 2021-08-12 HISTORY — PX: IR KYPHO LUMBAR INC FX REDUCE BONE BX UNI/BIL CANNULATION INC/IMAGING: IMG5519

## 2021-08-12 LAB — CBC
HCT: 40.9 % (ref 36.0–46.0)
Hemoglobin: 13 g/dL (ref 12.0–15.0)
MCH: 29.6 pg (ref 26.0–34.0)
MCHC: 31.8 g/dL (ref 30.0–36.0)
MCV: 93.2 fL (ref 80.0–100.0)
Platelets: 296 10*3/uL (ref 150–400)
RBC: 4.39 MIL/uL (ref 3.87–5.11)
RDW: 14.3 % (ref 11.5–15.5)
WBC: 5.3 10*3/uL (ref 4.0–10.5)
nRBC: 0 % (ref 0.0–0.2)

## 2021-08-12 LAB — BASIC METABOLIC PANEL
Anion gap: 8 (ref 5–15)
BUN: 14 mg/dL (ref 8–23)
CO2: 26 mmol/L (ref 22–32)
Calcium: 10.3 mg/dL (ref 8.9–10.3)
Chloride: 106 mmol/L (ref 98–111)
Creatinine, Ser: 0.91 mg/dL (ref 0.44–1.00)
GFR, Estimated: >60 mL/min (ref >60)
Glucose, Bld: 106 mg/dL — ABNORMAL HIGH (ref 70–99)
Potassium: 3.7 mmol/L (ref 3.5–5.1)
Sodium: 140 mmol/L (ref 135–145)

## 2021-08-12 LAB — PROTIME-INR
INR: 1 (ref 0.8–1.2)
Prothrombin Time: 12.9 seconds (ref 11.4–15.2)

## 2021-08-12 IMAGING — XA IR KYPHO VERTEBRAL LUMBAR AUGMENTATION
1 series · 13 of 20 positions shown · non-contrast
Comparison: MR [DATE], lumbar plain film [DATE]

INDICATION: 80-year-old female presents for treatment of symptomatic L1
compression fracture

EXAM:
IR KYPHO VERTEBRAL LUMBAR AUGMENTATION

[Series 2: vasc extremity · 13 of 20 slices shown]
[im 1/20]
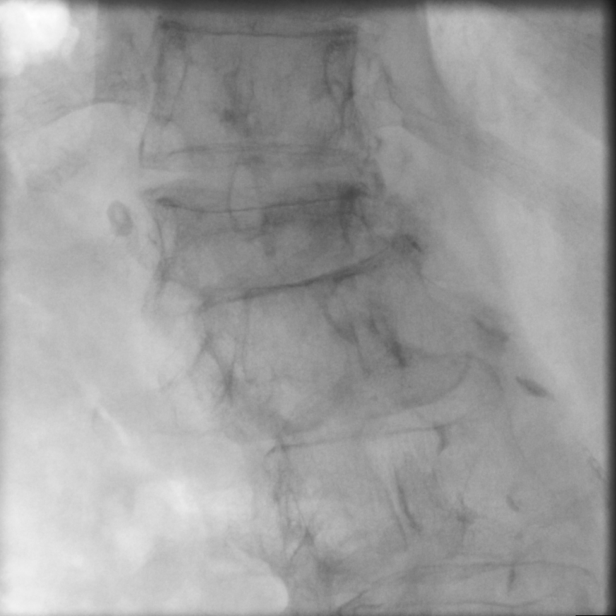
[im 3/20]
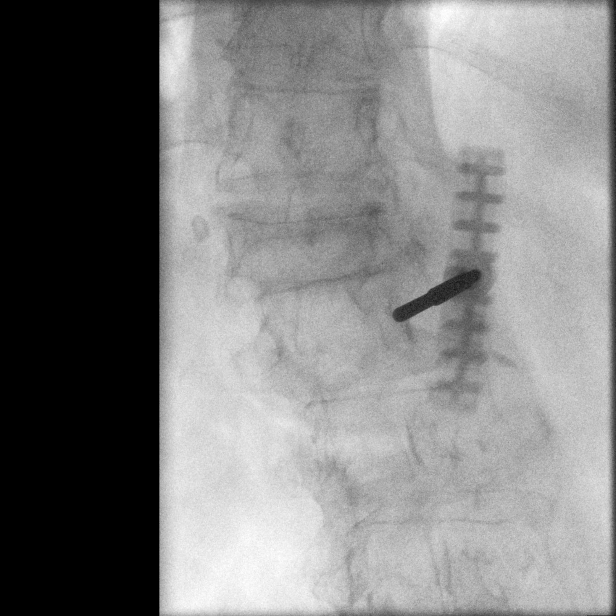
[im 4/20]
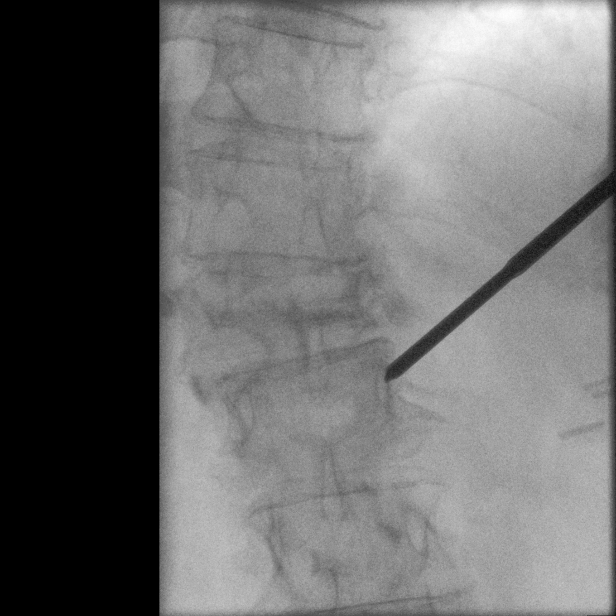
[im 6/20]
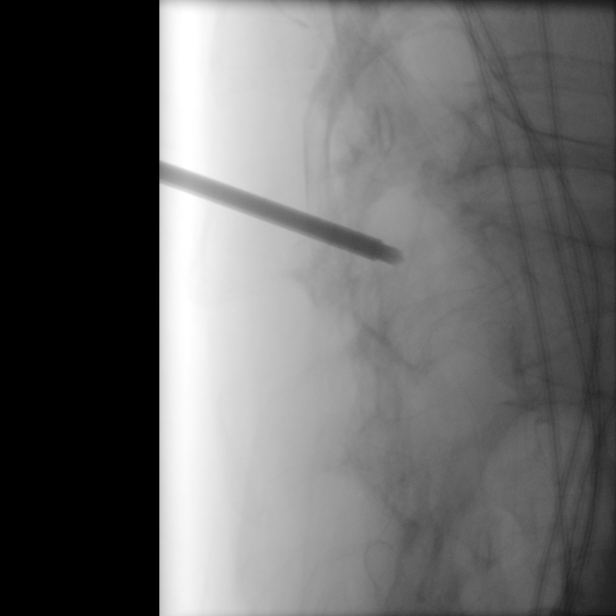
[im 7/20]
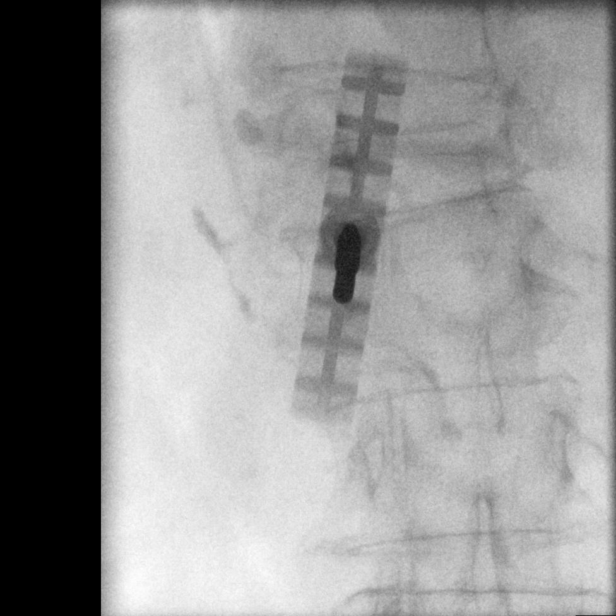
[im 9/20]
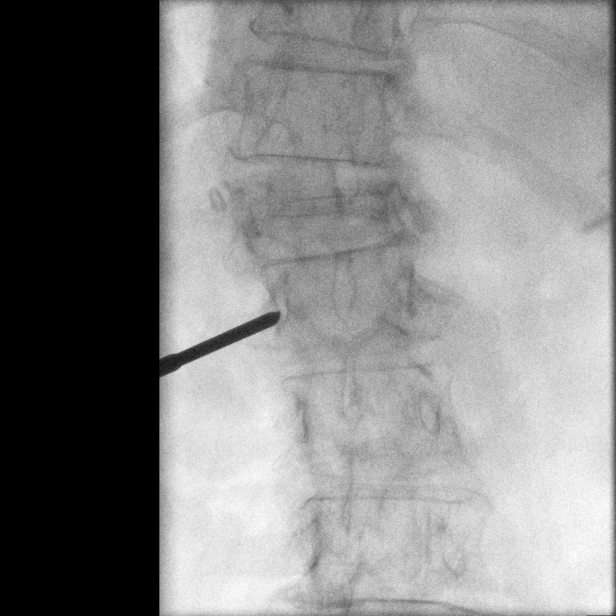
[im 11/20]
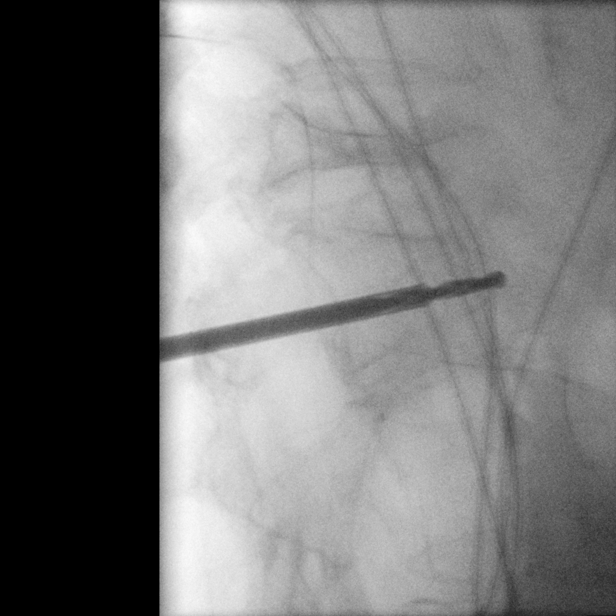
[im 12/20]
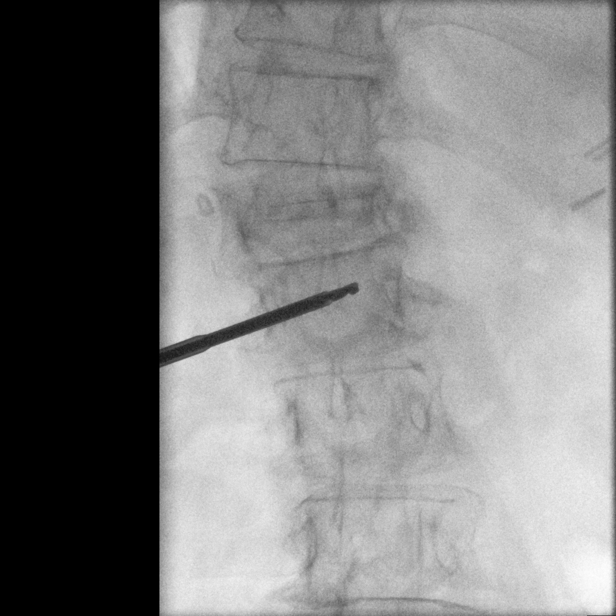
[im 14/20]
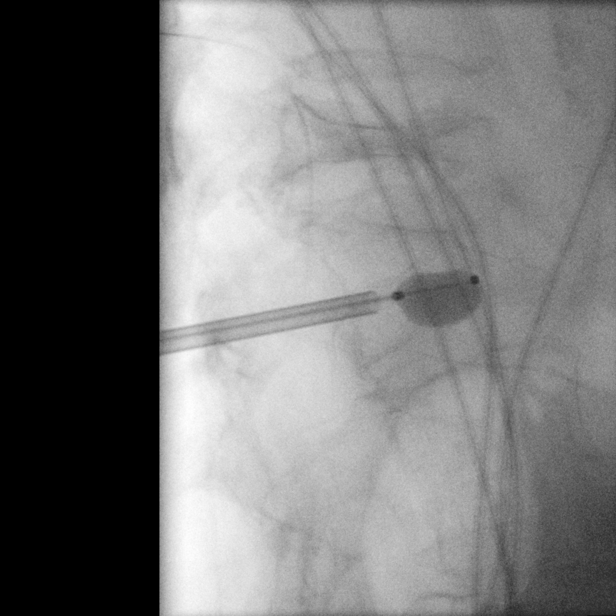
[im 15/20]
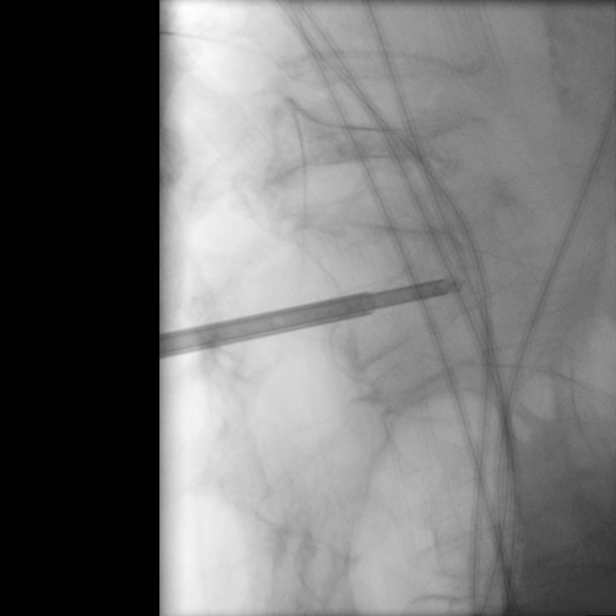
[im 17/20]
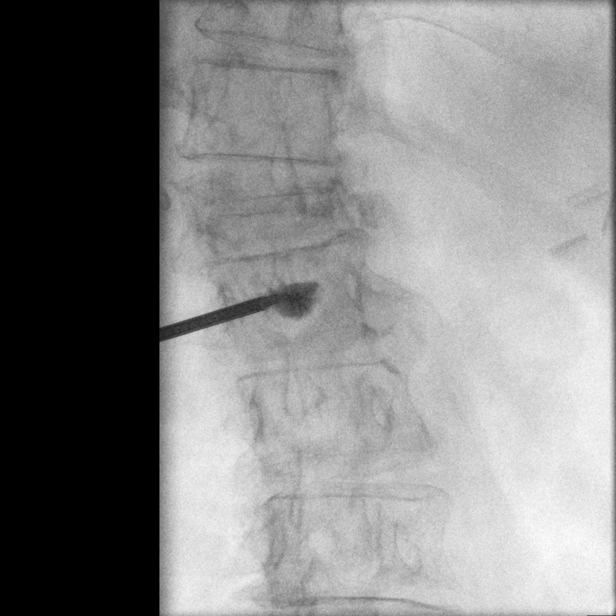
[im 18/20]
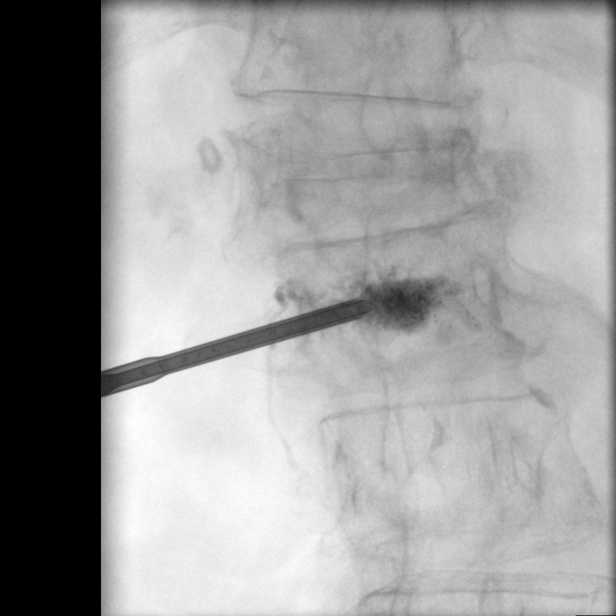
[im 20/20]
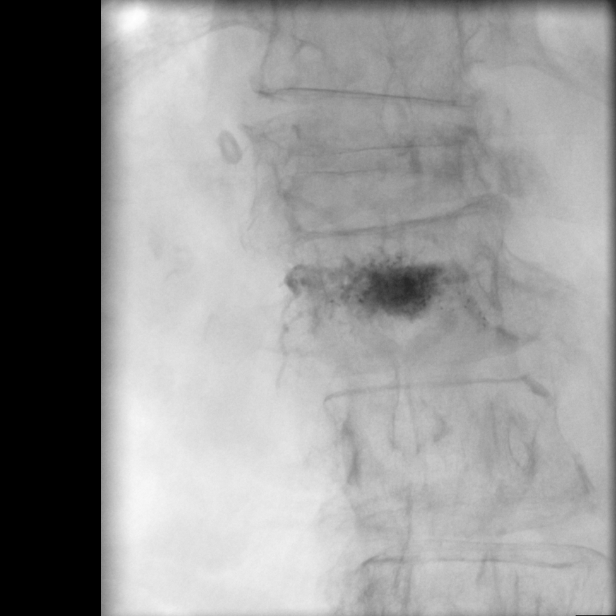

[13 of 20 positions shown; findings below may reference images not displayed]

MEDICATIONS:
As antibiotic prophylaxis, Vancomycin 1 gm IV was ordered
pre-procedure and administered intravenously within 1 hour of
incision.

ANESTHESIA/SEDATION:
Moderate (conscious) sedation was employed during this procedure. A
total of Versed 2.0 mg and Fentanyl 100 mcg was administered
intravenously.

Moderate Sedation Time: 41 minutes. The patient's level of
consciousness and vital signs were monitored continuously by
radiology nursing throughout the procedure under my direct
supervision.

FLUOROSCOPY TIME:  Fluoroscopy Time: 9 minutes 48 seconds (218.7
mGy)

COMPLICATIONS:
None

PROCEDURE:
Following a full explanation of the procedure along with the
potentially associated complications, a witnessed informed consent
was obtained. Specific risks that were discussed included bleeding,
infection, injury to adjacent structures, neurologic injury,
embolization of cement within the veins, failure of the procedure to
improve pain, need for further procedure/ surgery, cardiopulmonary
collapse, death. The patient understands the risks and wishes to
proceed.

The patient was placed prone on the fluoroscopic table. Nasal oxygen
was administered. Physiologic monitoring was performed throughout
the duration of the procedure. The skin overlying the L1 region was
prepped and draped in the usual sterile fashion. The T12 and L1
vertebral bodies were identified and the right L1 pedicle was
infiltrated with 1% lidocaine. This was then followed by initial
placement of an 8-gauge [REDACTED] needle at the posterior right L1
pedicle. The small size of the pedicle made for difficult initial
purchase of the diamond tip cannula. We elected to attempt a
left-sided Uni pedicular approach.

The left L1 pedicle was then infiltrated with 1% lidocaine. A small
incision was made with 11 blade scalpel, and then [REDACTED] 8
gauge needle was advanced through the left pedicle into the mid [DATE]
of the vertebral body, crossing the midline on the AP image. The
bone auger was placed confirming location in AP and lateral images.
We elected to proceed with Uni pedicular treatment given the
excellent midline position of the balloon.

Inflation of the left pedicular cannula balloons was performed under
fluoroscopic observation.

Methylmethacrylate mixture was then reconstituted. Under biplane
intermittent fluoroscopy, the methylmethacrylate was then injected
into the cavity at L1 vertebral body with filling of the fracture
cleft, cavity, and the interstices of the right and left and
posterior aspect of the vertebral body.

No extravasation was noted posteriorly into the spinal canal.
Minimal left-sided venous contamination was identified, with no
migration.

The needle was removed. Hemostasis was achieved at the skin entry
site.

There were no acute complications. Patient tolerated the procedure
well. The patient was observed for 30 minutes and returned to her
room in good condition.
IMPRESSION: Status post treatment of symptomatic L1 compression fracture with
vertebral augmentation kyphoplasty technique via left Uni pedicular
approach.

## 2021-08-12 MED ORDER — MIDAZOLAM HCL 2 MG/2ML IJ SOLN
INTRAMUSCULAR | Status: AC
  Filled 2021-08-12: qty 2

## 2021-08-12 MED ORDER — MIDAZOLAM HCL 5 MG/5ML IJ SOLN
INTRAMUSCULAR | Status: AC | PRN
Start: 2021-08-12 — End: 2021-08-12
  Administered 2021-08-12: .5 mg via INTRAVENOUS

## 2021-08-12 MED ORDER — MIDAZOLAM HCL 2 MG/2ML IJ SOLN
INTRAMUSCULAR | Status: AC | PRN
  Administered 2021-08-12: 1 mg via INTRAVENOUS
  Administered 2021-08-12: .5 mg via INTRAVENOUS

## 2021-08-12 MED ORDER — CEFAZOLIN SODIUM-DEXTROSE 2-4 GM/100ML-% IV SOLN
2.0000 g | INTRAVENOUS | Status: DC
  Filled 2021-08-12: qty 100

## 2021-08-12 MED ORDER — FENTANYL CITRATE (PF) 100 MCG/2ML IJ SOLN
INTRAMUSCULAR | Status: AC
  Filled 2021-08-12: qty 2

## 2021-08-12 MED ORDER — LIDOCAINE HCL (PF) 1 % IJ SOLN
INTRAMUSCULAR | Status: AC
  Filled 2021-08-12: qty 30

## 2021-08-12 MED ORDER — SODIUM CHLORIDE 0.9 % IV SOLN
INTRAVENOUS | Status: DC
  Filled 2021-08-12: qty 1000

## 2021-08-12 MED ORDER — DIPHENHYDRAMINE HCL 50 MG/ML IJ SOLN
50.0000 mg | Freq: Once | INTRAMUSCULAR | Status: AC
  Filled 2021-08-12: qty 1

## 2021-08-12 MED ORDER — FENTANYL CITRATE (PF) 100 MCG/2ML IJ SOLN
INTRAMUSCULAR | Status: AC | PRN
Start: 2021-08-12 — End: 2021-08-12
  Administered 2021-08-12 (×2): 25 ug via INTRAVENOUS
  Administered 2021-08-12: 50 ug via INTRAVENOUS

## 2021-08-12 MED ORDER — DIPHENHYDRAMINE HCL 50 MG/ML IJ SOLN
INTRAMUSCULAR | Status: AC
  Administered 2021-08-12: 50 mg via INTRAVENOUS
  Filled 2021-08-12: qty 1

## 2021-08-12 MED ORDER — LIDOCAINE HCL (PF) 1 % IJ SOLN
INTRAMUSCULAR | Status: AC
  Administered 2021-08-12: 29 mL
  Filled 2021-08-12: qty 30

## 2021-08-12 MED ORDER — VANCOMYCIN HCL IN DEXTROSE 1-5 GM/200ML-% IV SOLN
1000.0000 mg | Freq: Once | INTRAVENOUS | Status: DC
  Filled 2021-08-12: qty 200

## 2021-08-12 MED ORDER — VANCOMYCIN HCL 500 MG/100ML IV SOLN
INTRAVENOUS | Status: AC | PRN
  Administered 2021-08-12: 1000 mg via INTRAVENOUS

## 2021-08-12 NOTE — Progress Notes (Signed)
Patient clinically stable post L1 Kyphoplasty per Dr Earleen Newport, tolerated well. Received Versed 2 mg along with Fentanyl 100 mcg IV for procedure. Vitals stable pre and post. Report given to [place] RN in specials post procedure. ?

## 2021-08-12 NOTE — Progress Notes (Addendum)
Post procedure recovery radiology RN reached out to state patient had some itching and redness in the neck and face area. IV Vancomycin with 85m until completed infusion- this was stopped and 50 mg IV Benadryl was given, patient denied any shortness of breath, chest pain or hives, patient denied any worsening skin dryness or flaking when compared to pre-procedure chronic baseline dryness. Patient was seen and evaluated by me with improvement in redness and itching, RN also at bedside stating improvement in redness previously seen. Discussed with Dr. WEarleen Newportmy attending, who agreed with above plan and given improvement in symptoms after extended time of recovery with no recurrent or worsening symptoms patient was able to discharge home with family. Instructed to present to ED or call 911 if symptoms recur and patient verbalized understanding of plan.  ? ?MTsosie BillingD, PA-C ?08/12/2021, 3:16 PM ? ? ? ?

## 2021-08-12 NOTE — H&P (Signed)
? ?Chief Complaint: ?Patient was seen in consultation today for symptomatic L1 compression fracture at the request of McCullough,Heath K ? ?Referring Physician(s): ?McCullough,Heath K ? ?Supervising Physician: Corrie Mckusick ? ?Patient Status: [place] ? ?History of Present Illness: ?Laura Cole is a 81 y.o. female with symptomatic L1 acute/subacute compression fracture uncontrolled with pain medication and affecting ADL's with recent VIR consult and MRI imaging deemed appropriate for kyphoplasty.  ? ?The patient has had a H&P performed within the last 30 days, all history, medications, and exam have been reviewed. The patient denies any interval changes since the H&P. ? ?The patient denies any current chest pain or shortness of breath. She does admit to 4/10 lower back pain currently and usually with movement is 8/10. She denies any current blood thinner use, denies any known bleeding or clotting disorder. The patient denies any recent infections, fever or chills. The patient denies any history of sleep apnea or chronic oxygen use. She has no known complications to sedation.  ? ? ?Past Medical History:  ?Diagnosis Date  ? Bradycardia   ? Cancer [facility])   ? skin cancer  ? Coronary artery disease   ? Diverticulitis   ? GERD (gastroesophageal reflux disease)   ? Heart murmur   ? Hemorrhoids   ? Hypertension   ? Hypothyroidism   ? Osteoarthritis   ? Peripheral vascular disease ([place])   ? TIA (transient ischemic attack)   ? ? ?Past Surgical History:  ?Procedure Laterality Date  ? ABDOMINAL HYSTERECTOMY    ? BACK SURGERY    ? CHOLECYSTECTOMY    ? COLON SURGERY    ? COLONOSCOPY WITH PROPOFOL N/A 12/05/2019  ? Procedure: COLONOSCOPY WITH PROPOFOL;  Surgeon: Lesly Rubenstein, MD;  Location: [facility] ENDOSCOPY;  Service: Endoscopy;  Laterality: N/A;  ? ESOPHAGOGASTRODUODENOSCOPY (EGD) WITH PROPOFOL N/A 12/05/2019  ? Procedure: ESOPHAGOGASTRODUODENOSCOPY (EGD) WITH PROPOFOL;  Surgeon: Lesly Rubenstein, MD;  Location:  [facility] ENDOSCOPY;  Service: Endoscopy;  Laterality: N/A;  ? EYE SURGERY    ? IR RADIOLOGIST EVAL & MGMT  07/30/2021  ? RECTOCELE REPAIR    ? RECTOCELE REPAIR    ? REPLACEMENT TOTAL KNEE Bilateral   ? TKRX2 Bilateral   ? WRIST SURGERY    ? ? ?Allergies: ?Mushroom extract complex, Promethazine, Alprazolam, Cefdinir, Ciprofloxacin, Dilaudid [hydromorphone hcl], Hydromorphone, Phenergan [promethazine hcl], Pravastatin, and Augmentin [amoxicillin-pot clavulanate] ? ?Medications: ?Prior to Admission medications   ?Medication Sig Start Date End Date Taking? Authorizing Provider  ?amLODipine (NORVASC) 5 MG tablet Take 1 tablet (5 mg total) by mouth 2 (two) times daily. Home med. 05/27/21  Yes Enzo Bi, MD  ?baclofen (LIORESAL) 10 MG tablet Take 10 mg by mouth 3 (three) times daily as needed for muscle spasms.   Yes [provider]  ?levothyroxine (SYNTHROID) 50 MCG tablet Take 50 mcg by mouth daily before breakfast.   Yes [provider]  ?metoprolol tartrate (LOPRESSOR) 25 MG tablet Take 12.5 mg by mouth 2 (two) times daily.   Yes [provider]  ?ondansetron (ZOFRAN-ODT) 4 MG disintegrating tablet Take 4 mg by mouth every 8 (eight) hours as needed for nausea/vomiting.   Yes [provider]  ?pantoprazole (PROTONIX) 20 MG tablet Take 20 mg by mouth 2 (two) times daily.   Yes [provider]  ?  ? ?Family History  ?Problem Relation Age of Onset  ? Breast cancer Sister 91  ? ? ?Social History  ? ?Socioeconomic History  ? Marital status: Single  ?  Spouse name: Not on file  ? Number of children: Not on file  ? Years of education: Not on file  ? Highest education level: Not on file  ?Occupational History  ? Not on file  ?Tobacco Use  ? Smoking status: Never  ? Smokeless tobacco: Never  ?Vaping Use  ? Vaping Use: Never used  ?Substance and Sexual Activity  ? Alcohol use: Not Currently  ? Drug use: Never  ? Sexual activity: Not Currently  ?  Birth control/protection: Post-menopausal   ?Other Topics Concern  ? Not on file  ?Social History Narrative  ? Not on file  ? ?Social Determinants of Health  ? ?Financial Resource Strain: Not on file  ?Food Insecurity: Not on file  ?Transportation Needs: Not on file  ?Physical Activity: Not on file  ?Stress: Not on file  ?Social Connections: Not on file  ? ?Review of Systems: A 12 point ROS discussed and pertinent positives are indicated in the HPI above.  All other systems are negative. ? ?Review of Systems ? ?Vital Signs: ?BP (!) 120/54   Pulse (!) 53   Temp (!) 97.5 ?F (36.4 ?C) (Oral)   Resp (!) 25   Ht '5\' 5"'$  (1.651 m)   Wt 150 lb (68 kg)   LMP  (LMP Unknown)   SpO2 100%   BMI 24.96 kg/m?  ? ?Physical Exam ?Constitutional:   ?   Appearance: Normal appearance.  ?HENT:  ?   Head: Normocephalic and atraumatic.  ?Cardiovascular:  ?   Rate and Rhythm: Normal rate and regular rhythm.  ?Pulmonary:  ?   Effort: Pulmonary effort is normal. No respiratory distress.  ?Musculoskeletal:     ?   General: Tenderness present.  ?   Comments: Low back pain TTP  ?Skin: ?   General: Skin is warm and dry.  ?Neurological:  ?   Mental Status: She is alert and oriented to person, place, and time.  ? ? ?Imaging: ?MR THORACIC SPINE WO CONTRAST ? ?Result Date: 07/23/2021 ?CLINICAL DATA:  Bending injury on 05/07/2021.  Compression fracture. EXAM: MRI THORACIC SPINE WITHOUT CONTRAST TECHNIQUE: Multiplanar, multisequence MR imaging of the thoracic spine was performed. No intravenous contrast was administered. COMPARISON:  Radiography 05/07/2021. FINDINGS: Alignment: Scoliotic curvature convex to the right with the apex at T6. Vertebrae: Old minor superior endplate depression at T4. Old healed augmented fracture at T8 with loss of height of 30%. Progression of the previously seen acute fracture at T12 with loss of height of 70% presently and retropulsion and or disc extrusion extending 5 mm posteriorly, encroaching upon the spinal canal, with effacement of the subarachnoid space  and slight indentation of the cord. Superior endplate fracture anteriorly at L1 with loss of height 20% anteriorly but no posterior vertebral body involvement. Cord: No primary cord lesion. See above for description of the T12 level. Paraspinal and other soft tissues: Aortic atherosclerosis. Otherwise negative. Disc levels: No significant disc level pathology from T1-2 through T11-12. Minimal non-compressive disc bulges. As noted above, at T12 and T12-L1, there is 5 mm retropulsion of the inferior endplate and probably some associated disc material which encroaches upon the spinal canal, effacing the ventral subarachnoid space and indenting the ventral cord slightly. IMPRESSION: Progression of the previously seen compression fracture at T12, now with loss of height of 70%. Inferior endplate shows retropulsion and possibly associated herniation of the T12-L1 disc by a distance of 5 mm, encroaching upon the spinal canal, effacing the ventral subarachnoid space and indenting  the ventral cord slightly. New superior endplate fracture anteriorly at L1 with loss of height anteriorly of 20%. No involvement of the posterior aspect of the vertebral body. Electronically Signed   By: Nelson Chimes M.D.   On: 07/23/2021 16:43  ? ?IR Radiologist Eval & Mgmt ? ?Result Date: 07/30/2021 ?EXAM: NEW PATIENT OFFICE VISIT CHIEF COMPLAINT: SEE NOTE IN EPIC HISTORY OF PRESENT ILLNESS: SEE NOTE IN EPIC REVIEW OF SYSTEMS: SEE NOTE IN EPIC PHYSICAL EXAMINATION: SEE NOTE IN EPIC ASSESSMENT AND PLAN: SEE NOTE IN EPIC Electronically Signed   By: Jacqulynn Cadet M.D.   On: 07/30/2021 15:06   ? ?Labs: ? ?CBC: ?Recent Labs  ?  05/25/21 ?1826 05/27/21 ?0418 07/09/21 ?0958 08/12/21 ?0757  ?WBC 6.3 7.3 6.9 5.3  ?HGB 12.8 12.4 12.5 13.0  ?HCT 38.4 37.1 39.1 40.9  ?PLT 342 329 317 296  ? ? ?COAGS: ?Recent Labs  ?  08/12/21 ?0757  ?INR 1.0  ? ? ?BMP: ?Recent Labs  ?  05/26/21 ?1132 05/27/21 ?0418 07/09/21 ?0958 08/12/21 ?0757  ?NA 138 136 138 140  ?K  2.8* 3.4* 3.6 3.7  ?CL 106 105 103 106  ?CO2 '24 24 25 26  '$ ?GLUCOSE 126* 94 128* 106*  ?BUN '10 10 9 14  '$ ?CALCIUM 9.2 8.7* 10.4* 10.3  ?CREATININE 0.70 0.64 0.86 0.91  ?GFRNONAA >60 >60 >60 >60  ? ? ?LIVER FUNCTION TES

## 2021-08-15 ENCOUNTER — Other Ambulatory Visit: Payer: Self-pay | Admitting: Interventional Radiology

## 2021-08-20 ENCOUNTER — Other Ambulatory Visit: Payer: Self-pay | Admitting: Interventional Radiology

## 2021-08-20 DIAGNOSIS — S32000A Wedge compression fracture of unspecified lumbar vertebra, initial encounter for closed fracture: Secondary | ICD-10-CM

## 2021-08-29 ENCOUNTER — Ambulatory Visit
Admission: RE | Admit: 2021-08-29 | Discharge: 2021-08-29 | Disposition: A | Discharge status: Home / Self Care | Payer: Medicare Other | Source: Ambulatory Visit | Attending: Interventional Radiology | Admitting: Interventional Radiology

## 2021-08-29 ENCOUNTER — Other Ambulatory Visit: Payer: Self-pay | Admitting: Student

## 2021-08-29 ENCOUNTER — Telehealth: Payer: Self-pay

## 2021-08-29 DIAGNOSIS — S32000A Wedge compression fracture of unspecified lumbar vertebra, initial encounter for closed fracture: Secondary | ICD-10-CM

## 2021-08-29 MED ORDER — NAPROXEN SODIUM 550 MG PO TABS: 550.0000 mg | ORAL_TABLET | Freq: Two times a day (BID) | ORAL | 0 refills | Status: DC

## 2021-08-29 MED ORDER — OXYCODONE-ACETAMINOPHEN 5-325 MG PO TABS: 1.0000 | ORAL_TABLET | Freq: Four times a day (QID) | ORAL | 0 refills | Status: DC | PRN

## 2021-08-29 MED ORDER — NAPROXEN 500 MG PO TABS
500.0000 mg | ORAL_TABLET | Freq: Two times a day (BID) | ORAL | 0 refills | Status: DC
Start: 2021-08-29 — End: 2021-12-26

## 2021-08-29 NOTE — Progress Notes (Signed)
? ? ?Chief Complaint: ?Patient was seen in consultation today for thoracolumbar pain at the request of [place] ? ?Referring Physician(s): ?Loleta Dicker ? ?History of Present Illness: ?Laura Cole is a 81 y.o. female who presented on 07/30/21 with a recent exacerbation of ongoing back pain since suffering a T12 compression fracture on May 07, 2021.  [name] was sitting on her bed on 20 December putting on her compression hose when she felt a sudden pop in her back followed by acute pain.  She recognized the sensation from a prior T8 compression fracture she had many years prior.  She underwent cement augmentation with kyphoplasty for the T8 fracture approximately 16 years ago.  While the fracture pain was resolved, she has since found it very uncomfortable to wear a bra and associates this with the kyphoplasty procedure.  For this reason, she initially sought conservative management.  She has a brace which she wears when she can tolerate it which is not very often.  She was beginning to feel better until approximately 3 weeks ago when she had a sudden onset of severe exacerbation of her pain which is not getting any better.  Recent MRI imaging demonstrates a subacute to chronic fracture at T12 with vertebral plana appearance and approximately 5 mm of posterior retropulsion effacing the anterior CSF space.  ?  ?Additionally, there is a more acute appearing fracture involving the superior endplate of L1 with approximately 20% height loss anteriorly with no evidence of retropulsion. ? ?She subsequently underwent L1 cement augmentation with balloon kyphoplasty on 08/12/2021.  This was performed by my partner, Dr. Corrie Mckusick.  She did well and was discharged home in good condition.  Since the time of her procedure, she has had some relief of her primary symptoms, however this has been intermittent.  Her symptoms continue to wax and wane.  She feels like she has new pain lower down in her lumbar spine.   In particular, when she is sleeping and changing positions in bed she feels a new sensation that she is not familiar with and feels that there is something new going on.  She also remains tender in the region of T12 and L1, particularly when lying back against a firm surface.  She denies new onset lower extremity weakness, paresthesias or other symptoms of cord or nerve compression. ? ?Past Medical History:  ?Diagnosis Date  ? Bradycardia   ? Cancer [facility])   ? skin cancer  ? Coronary artery disease   ? Diverticulitis   ? GERD (gastroesophageal reflux disease)   ? Heart murmur   ? Hemorrhoids   ? Hypertension   ? Hypothyroidism   ? Osteoarthritis   ? Peripheral vascular disease ([place])   ? TIA (transient ischemic attack)   ? ? ?Past Surgical History:  ?Procedure Laterality Date  ? ABDOMINAL HYSTERECTOMY    ? BACK SURGERY    ? CHOLECYSTECTOMY    ? COLON SURGERY    ? COLONOSCOPY WITH PROPOFOL N/A 12/05/2019  ? Procedure: COLONOSCOPY WITH PROPOFOL;  Surgeon: Lesly Rubenstein, MD;  [facility] ENDOSCOPY;  Service: Endoscopy;  Laterality: N/A;  ? ESOPHAGOGASTRODUODENOSCOPY (EGD) WITH PROPOFOL N/A 12/05/2019  ? Procedure: ESOPHAGOGASTRODUODENOSCOPY (EGD) WITH PROPOFOL;  Surgeon: Lesly Rubenstein, MD;  Location: [facility] ENDOSCOPY;  Service: Endoscopy;  Laterality: N/A;  ? EYE SURGERY    ? IR KYPHO LUMBAR INC FX REDUCE BONE BX UNI/BIL CANNULATION INC/IMAGING  08/12/2021  ? IR RADIOLOGIST EVAL & MGMT  07/30/2021  ? RECTOCELE REPAIR    ?  RECTOCELE REPAIR    ? REPLACEMENT TOTAL KNEE Bilateral   ? TKRX2 Bilateral   ? WRIST SURGERY    ? ? ?Allergies: ?Mushroom extract complex, Promethazine, Alprazolam, Cefdinir, Ciprofloxacin, Dilaudid [hydromorphone hcl], Hydromorphone, Phenergan [promethazine hcl], Pravastatin, and Augmentin [amoxicillin-pot clavulanate] ? ?Medications: ?Prior to Admission medications   ?Medication Sig Start Date End Date Taking? Authorizing Provider  ?amLODipine (NORVASC) 5 MG tablet Take 1 tablet (5 mg total)  by mouth 2 (two) times daily. Home med. 05/27/21   Enzo Bi, MD  ?baclofen (LIORESAL) 10 MG tablet Take 10 mg by mouth 3 (three) times daily as needed for muscle spasms.    [provider]  ?levothyroxine (SYNTHROID) 50 MCG tablet Take 50 mcg by mouth daily before breakfast.    [provider]  ?metoprolol tartrate (LOPRESSOR) 25 MG tablet Take 12.5 mg by mouth 2 (two) times daily.    [provider]  ?ondansetron (ZOFRAN-ODT) 4 MG disintegrating tablet Take 4 mg by mouth every 8 (eight) hours as needed for nausea/vomiting.    [provider]  ?pantoprazole (PROTONIX) 20 MG tablet Take 20 mg by mouth 2 (two) times daily.    [provider]  ?  ? ?Family History  ?Problem Relation Age of Onset  ? Breast cancer Sister 93  ? ? ?Social History  ? ?Socioeconomic History  ? Marital status: Single  ?  Spouse name: Not on file  ? Number of children: Not on file  ? Years of education: Not on file  ? Highest education level: Not on file  ?Occupational History  ? Not on file  ?Tobacco Use  ? Smoking status: Never  ? Smokeless tobacco: Never  ?Vaping Use  ? Vaping Use: Never used  ?Substance and Sexual Activity  ? Alcohol use: Not Currently  ? Drug use: Never  ? Sexual activity: Not Currently  ?  Birth control/protection: Post-menopausal  ?Other Topics Concern  ? Not on file  ?Social History Narrative  ? Not on file  ? ?Social Determinants of Health  ? ?Financial Resource Strain: Not on file  ?Food Insecurity: Not on file  ?Transportation Needs: Not on file  ?Physical Activity: Not on file  ?Stress: Not on file  ?Social Connections: Not on file  ? ? ?Review of Systems: A 12 point ROS discussed and pertinent positives are indicated in the HPI above.  All other systems are negative. ? ?Review of Systems ? ?Vital Signs: ?BP 139/63   Pulse (!) 52   Temp 97.9 ?F (36.6 ?C)   Resp 16   LMP  (LMP Unknown)   SpO2 98%  ? ?Physical Exam ?Constitutional:   ?   General: She is not in acute  distress. ?   Appearance: Normal appearance.  ?HENT:  ?   Head: Normocephalic and atraumatic.  ?Eyes:  ?   General: No scleral icterus. ?Cardiovascular:  ?   Rate and Rhythm: Normal rate.  ?Pulmonary:  ?   Effort: Pulmonary effort is normal.  ?Abdominal:  ?   General: There is no distension.  ?   Palpations: Abdomen is soft.  ?   Tenderness: There is no abdominal tenderness.  ?Musculoskeletal:  ?     Back: ? ?   Comments: TTP over T12 and L1 as well as lower lumbar at L4 and L5.  Incisions well healed, no erythema.   ?Skin: ?   General: Skin is warm and dry.  ?Neurological:  ?   Mental Status: She is alert and  oriented to person, place, and time.  ?Psychiatric:     ?   Mood and Affect: Mood normal.     ?   Behavior: Behavior normal.  ? ? ? ?Imaging: ?IR KYPHO LUMBAR INC FX REDUCE BONE BX UNI/BIL CANNULATION INC/IMAGING ? ?Result Date: 08/12/2021 ?INDICATION: 81 year old female presents for treatment of symptomatic L1 compression fracture EXAM: IR KYPHO VERTEBRAL LUMBAR AUGMENTATION COMPARISON:  MR 07/23/2021, lumbar plain film 05/07/2021 MEDICATIONS: As antibiotic prophylaxis, Vancomycin 1 gm IV was ordered pre-procedure and administered intravenously within 1 hour of incision. ANESTHESIA/SEDATION: Moderate (conscious) sedation was employed during this procedure. A total of Versed 2.0 mg and Fentanyl 100 mcg was administered intravenously. Moderate Sedation Time: 41 minutes. The patient's level of consciousness and vital signs were monitored continuously by radiology nursing throughout the procedure under my direct supervision. FLUOROSCOPY TIME:  Fluoroscopy Time: 9 minutes 48 seconds (672.0 mGy) COMPLICATIONS: None PROCEDURE: Following a full explanation of the procedure along with the potentially associated complications, a witnessed informed consent was obtained. Specific risks that were discussed included bleeding, infection, injury to adjacent structures, neurologic injury, embolization of cement within the  veins, failure of the procedure to improve pain, need for further procedure/ surgery, cardiopulmonary collapse, death. The patient understands the risks and wishes to proceed. The patient was placed prone on t

## 2021-08-30 ENCOUNTER — Other Ambulatory Visit: Payer: Self-pay | Admitting: Neurosurgery

## 2021-08-30 DIAGNOSIS — M545 Low back pain, unspecified: Secondary | ICD-10-CM

## 2021-09-04 ENCOUNTER — Ambulatory Visit
Admission: RE | Admit: 2021-09-04 | Discharge: 2021-09-04 | Disposition: A | Discharge status: Home / Self Care | Payer: Medicare Other | Source: Ambulatory Visit | Attending: Neurosurgery | Admitting: Neurosurgery

## 2021-09-04 ENCOUNTER — Other Ambulatory Visit: Payer: Self-pay | Admitting: Interventional Radiology

## 2021-09-04 DIAGNOSIS — S32000A Wedge compression fracture of unspecified lumbar vertebra, initial encounter for closed fracture: Secondary | ICD-10-CM

## 2021-09-04 DIAGNOSIS — M545 Low back pain, unspecified: Secondary | ICD-10-CM

## 2021-09-04 IMAGING — MR MR LUMBAR SPINE W/O CM
4 of 7 series · 20 of 48 positions shown · non-contrast
Comparison: Thoracic spine MRI [DATE] lumbar spine radiographs
[DATE]

CLINICAL DATA: Severe back pain with anterior and lateral abdominal
pain, posterior hip pain since [DATE] after a strain

EXAM:
MRI LUMBAR SPINE WITHOUT CONTRAST
TECHNIQUE: Multiplanar, multisequence MR imaging of the lumbar spine was
performed. No intravenous contrast was administered.

[Series 6: T2 · sagittal · 4.0mm · 0.73mm/px · 5 of 19 slices shown (1 of 4)]
[im 1/19]
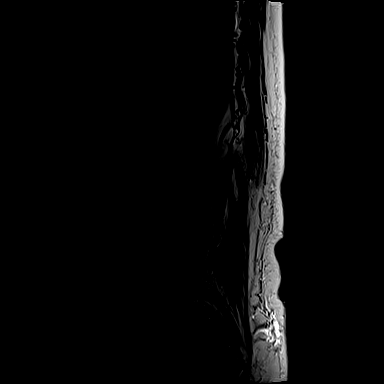
[im 5/19]
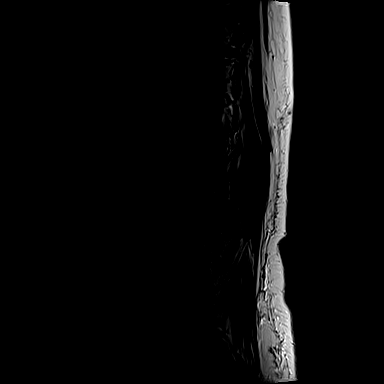
[im 10/19]
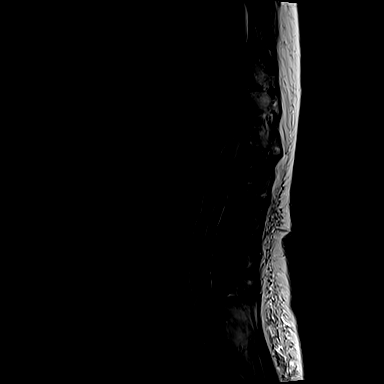
[im 14/19]
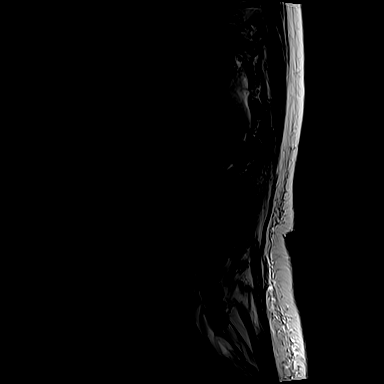
[im 19/19]
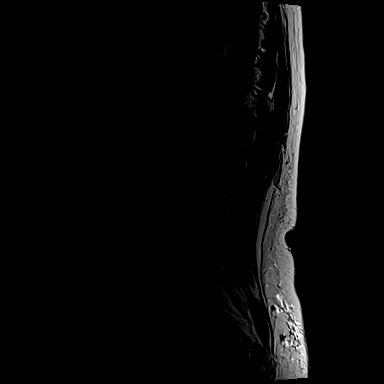

[Series 9: T2 · coronal · 5.0mm · 0.73mm/px · 5 of 16 slices shown (2 of 4)]
[im 1/16]
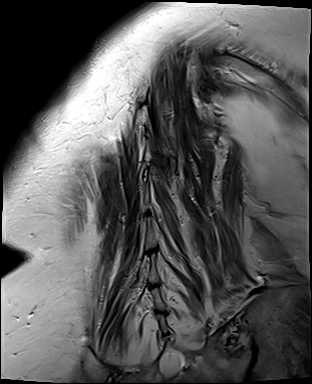
[im 4/16]
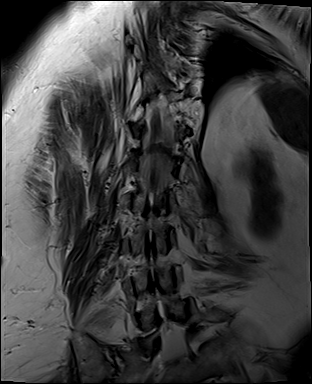
[im 8/16]
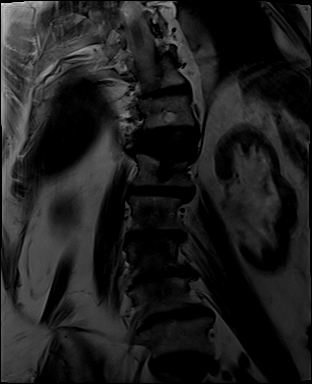
[im 12/16]
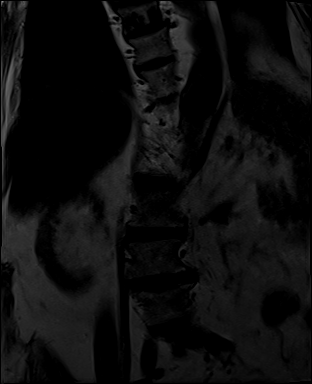
[im 16/16]
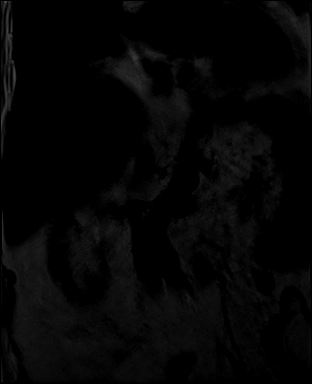

[Series 12: T2 · axial · 4.0mm · 0.28mm/px · z∈[-82,+105]mm · 7 of 38 slices shown (3 of 4)]
[im 1/38]
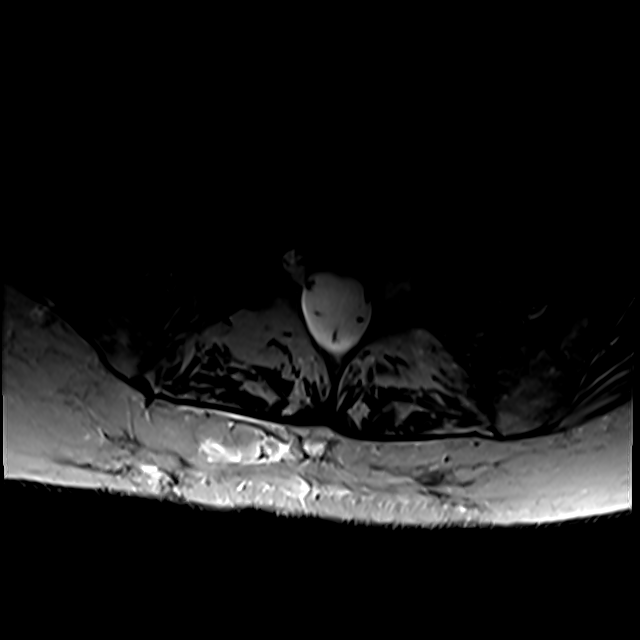
[im 4/38]
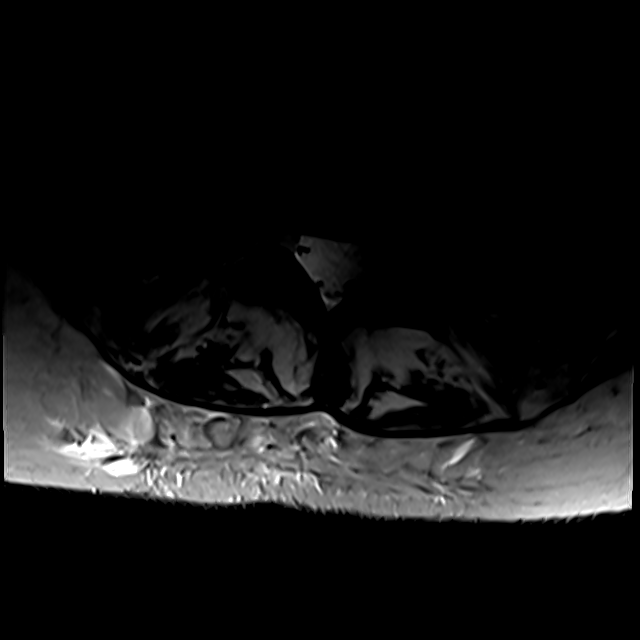
[im 8/38]
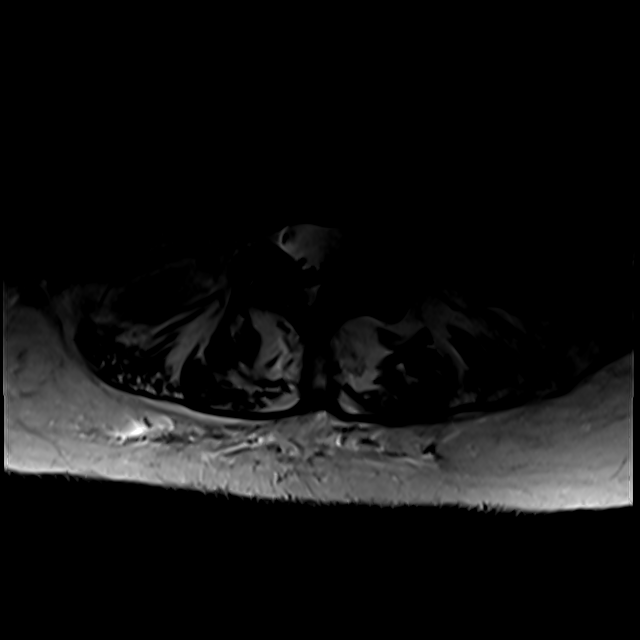
[im 12/38]
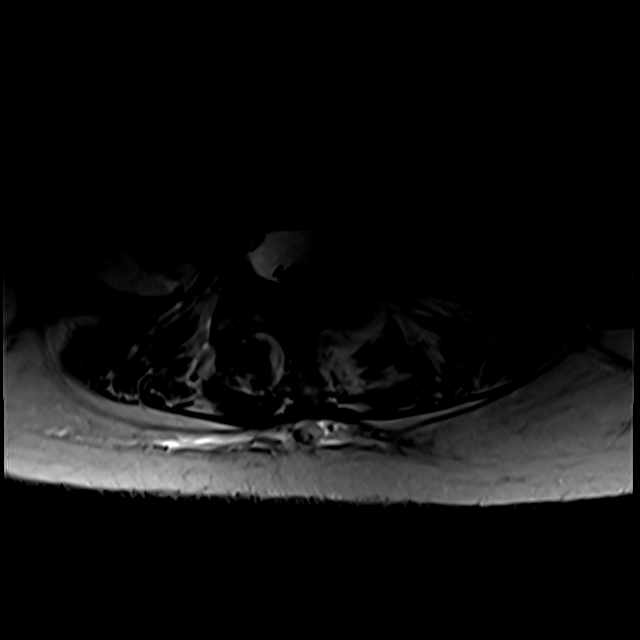
[im 15/38]
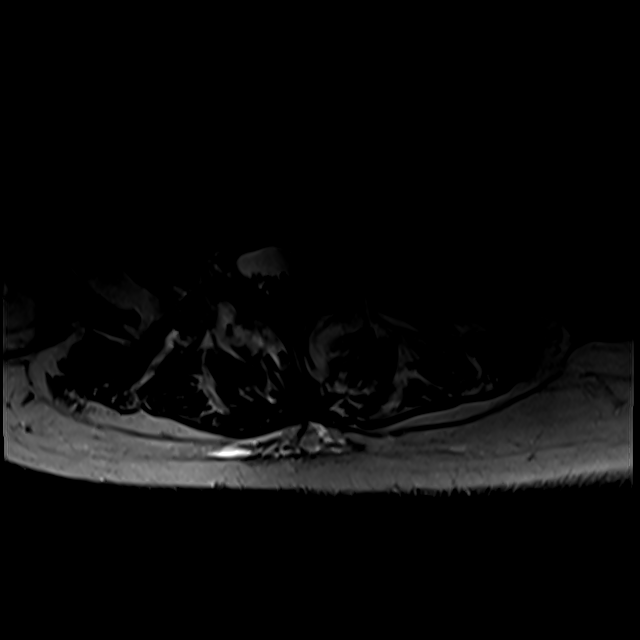
[im 19/38]
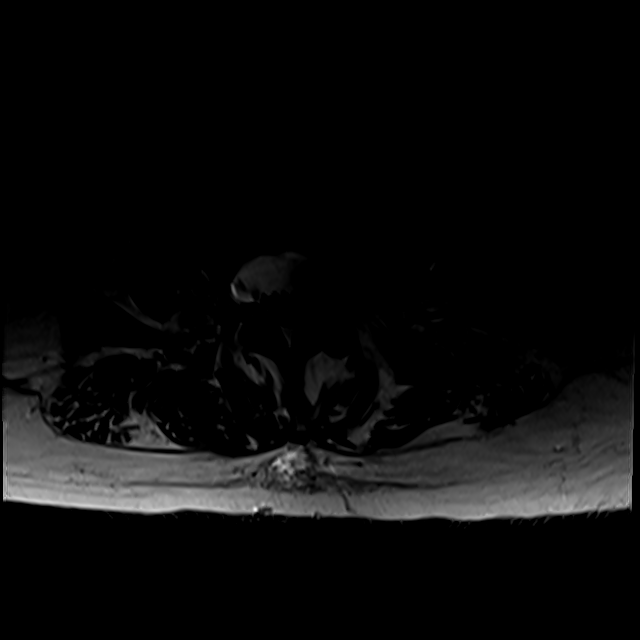
[im 34/38]
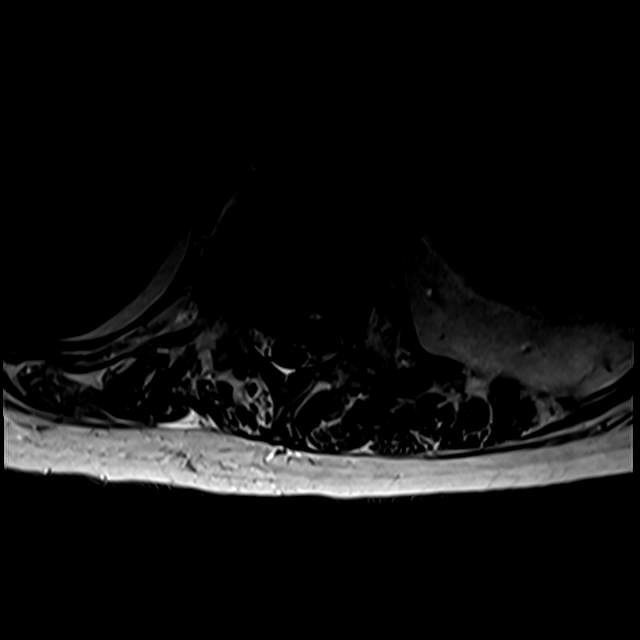

[Series 15: T2 · coronal · 5.0mm · 0.73mm/px · 3 of 16 slices shown (4 of 4)]
[im 1/16]
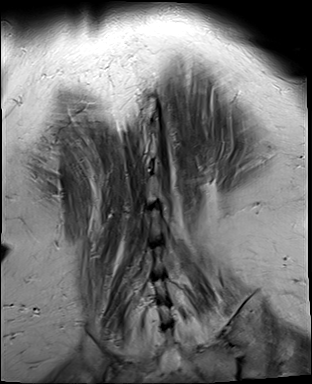
[im 8/16]
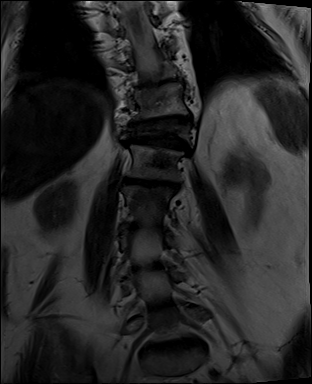
[im 16/16]
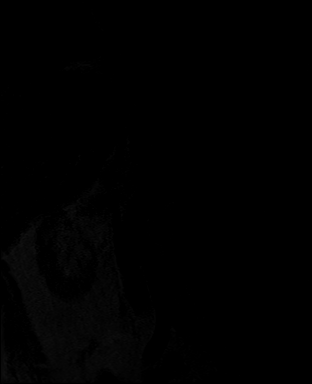

[20 of 48 positions shown; findings below may reference images not displayed]

FINDINGS: Segmentation: Standard; the lowest formed disc space is designated
L5-S1.

Alignment: There is S shaped curvature with levocurvature centered
at T11 and dextrocurvature centered at L3. There is no antero or
retrolisthesis.

Vertebrae: Background marrow signal is normal. Small intraosseous
hemangiomas are seen in the T10 and T11 vertebral bodies.

There is severe compression deformity of the T12 vertebral body with
approximately 70% loss of vertebral body height, similar to the
thoracic spine MRI from [DATE]. Bony retropulsion is also not
significantly changed resulting in moderate spinal canal stenosis
with slight indentation of the ventral cord but no cord signal
abnormality.

Mild compression deformity of the L1 vertebral body with
approximately 30% loss of vertebral body height anteriorly is also
similar in extent compared to the prior MRI with interval vertebral
augmentation. There is no bony retropulsion.

Compression deformity and post vertebral augmentation changes at T8
are partially imaged on the coronal sequence, incompletely
evaluated. The other vertebral body heights are preserved. There is
no evidence of new acute injury.

Conus medullaris and cauda equina: Conus extends to the L1-L2 level.
Conus and cauda equina appear normal.

Paraspinal and other soft tissues: A left renal cyst is noted, for
which no specific imaging follow-up is required.

Disc levels:

There is multilevel disc desiccation and narrowing, mild for age.

T12-L1: as above, there is moderate spinal canal stenosis due to the
bony retropulsion. There is moderate right and no significant left
neural foraminal stenosis at this level.

L1-L2: There is a mild disc bulge without significant spinal canal
or neural foraminal stenosis

L2-L3: There is a mild broad-based disc protrusion eccentric to the
right without significant spinal canal or neural foraminal stenosis

L3-L4: There is a diffuse disc bulge eccentric to the left and mild
facet arthropathy resulting in mild left and no significant right
neural foraminal stenosis and no significant spinal canal stenosis

L4-L5: There is a mild disc bulge and mild facet arthropathy without
significant spinal canal or neural foraminal stenosis.

L5-S1: No significant spinal canal or neural foraminal stenosis.
IMPRESSION: 1. Unchanged severe compression deformity of the T12 vertebral body
compared to the study from [DATE], with stable bony retropulsion
resulting in moderate spinal canal stenosis and slight indentation
of the ventral cord without cord signal abnormality. Moderate right
neural foraminal stenosis at this level.
2. Interval vertebral augmentation at L1 with unchanged degree of
anterior compression. No bony retropulsion at this level.
3. S shaped curvature of the thoracolumbar spine as above.
4. Overall mild for age multilevel degenerative changes throughout
the remainder of the lumbar spine as above without high-grade spinal
canal or neural foraminal stenosis.
5. No evidence of new acute injury.

## 2021-09-05 ENCOUNTER — Other Ambulatory Visit: Payer: Self-pay | Admitting: Interventional Radiology

## 2021-09-05 ENCOUNTER — Encounter: Payer: Self-pay | Admitting: *Deleted

## 2021-09-05 ENCOUNTER — Ambulatory Visit
Admission: RE | Admit: 2021-09-05 | Discharge: 2021-09-05 | Disposition: A | Discharge status: Home / Self Care | Payer: Medicare Other | Source: Ambulatory Visit | Attending: Interventional Radiology | Admitting: Interventional Radiology

## 2021-09-05 DIAGNOSIS — S32000A Wedge compression fracture of unspecified lumbar vertebra, initial encounter for closed fracture: Secondary | ICD-10-CM

## 2021-09-05 HISTORY — PX: IR RADIOLOGIST EVAL & MGMT: IMG5224

## 2021-09-05 NOTE — Progress Notes (Signed)
? ? ?Chief Complaint: ?Patient was consulted remotely today (TeleHealth) for thoracolumbar pain at the request of Laura Cole K.   ? ?Referring Physician(s): ?Loleta Dicker ? ?History of Present Illness: ?Laura Cole is a 81 y.o. female who presented on 07/30/21 with a recent exacerbation of ongoing back pain since suffering a T12 compression fracture on May 07, 2021.  [name] was sitting on her bed on 20 December putting on her compression hose when she felt a sudden pop in her back followed by acute pain.  She recognized the sensation from a prior T8 compression fracture she had many years prior.  She underwent cement augmentation with kyphoplasty for the T8 fracture approximately 16 years ago.  While the fracture pain was resolved, she has since found it very uncomfortable to wear a bra and associates this with the kyphoplasty procedure.  For this reason, she initially sought conservative management.  She has a brace which she wears when she can tolerate it which is not very often.  She was beginning to feel better until approximately 3 weeks ago when she had a sudden onset of severe exacerbation of her pain which is not getting any better.  Recent MRI imaging demonstrates a subacute to chronic fracture at T12 with vertebral plana appearance and approximately 5 mm of posterior retropulsion effacing the anterior CSF space.  ?  ?Additionally, there is a more acute appearing fracture involving the superior endplate of L1 with approximately 20% height loss anteriorly with no evidence of retropulsion. ? ?She subsequently underwent L1 cement augmentation with balloon kyphoplasty on 08/12/2021.  This was performed by my partner, Dr. Corrie Mckusick.  She did well and was discharged home in good condition.  Since the time of her procedure, she has had some relief of her primary symptoms, however this has been intermittent.  Her symptoms continue to wax and wane.  She feels like she has new pain lower down  in her lumbar spine.  In particular, when she is sleeping and changing positions in bed she feels a new sensation that she is not familiar with and feels that there is something new going on.  She also remains tender in the region of T12 and L1, particularly when lying back against a firm surface.  ? ?I initiated her on Percocet 5/325 every 6 hours as needed as well as naproxen 550 mg twice daily.  We also obtained an MRI of the lumbar spine to assess for a new fracture.  We have a follow-up conversation today to discuss the pain regimen and lumbar MRI findings. ? ?Fortunately, her lumbar MRI demonstrates no evidence of new fracture.  The intervention at L1 looks good.  No significant interval progression of the chronic fracture at T12 with significant residual posterior retropulsion.  Broad-based disc bulge at T12-L1 results in moderate spinal and right foraminal stenosis.  ? ?She reports that the Percocet has helped quite a bit.  She is also still taking baclofen which helps as well.  She took naproxen 1 day but was concerned when she seemed to have palpitations that night.  This does happen intermittently but she was not sure if it was related to the naproxen.  She has not taken it since until she had a chance to speak to me.  She does not feel certain that the naproxen exacerbated her intermittent palpitations.  This may have been coincidental. ? ?Overall, she feels slightly better but still cannot stand or be active for more than a short while without  significant pain and discomfort. ? ? ? ?Past Medical History:  ?Diagnosis Date  ? Bradycardia   ? Cancer [facility])   ? skin cancer  ? Coronary artery disease   ? Diverticulitis   ? GERD (gastroesophageal reflux disease)   ? Heart murmur   ? Hemorrhoids   ? Hypertension   ? Hypothyroidism   ? Osteoarthritis   ? Peripheral vascular disease ([place])   ? TIA (transient ischemic attack)   ? ? ?Past Surgical History:  ?Procedure Laterality Date  ? ABDOMINAL HYSTERECTOMY    ?  BACK SURGERY    ? CHOLECYSTECTOMY    ? COLON SURGERY    ? COLONOSCOPY WITH PROPOFOL N/A 12/05/2019  ? Procedure: COLONOSCOPY WITH PROPOFOL;  Surgeon: Lesly Rubenstein, MD;  Location: [facility] ENDOSCOPY;  Service: Endoscopy;  Laterality: N/A;  ? ESOPHAGOGASTRODUODENOSCOPY (EGD) WITH PROPOFOL N/A 12/05/2019  ? Procedure: ESOPHAGOGASTRODUODENOSCOPY (EGD) WITH PROPOFOL;  Surgeon: Lesly Rubenstein, MD;  Location: [facility] ENDOSCOPY;  Service: Endoscopy;  Laterality: N/A;  ? EYE SURGERY    ? IR KYPHO LUMBAR INC FX REDUCE BONE BX UNI/BIL CANNULATION INC/IMAGING  08/12/2021  ? IR RADIOLOGIST EVAL & MGMT  07/30/2021  ? RECTOCELE REPAIR    ? RECTOCELE REPAIR    ? REPLACEMENT TOTAL KNEE Bilateral   ? TKRX2 Bilateral   ? WRIST SURGERY    ? ? ?Allergies: ?Mushroom extract complex, Promethazine, Alprazolam, Cefdinir, Ciprofloxacin, Dilaudid [hydromorphone hcl], Hydromorphone, Phenergan [promethazine hcl], Pravastatin, and Augmentin [amoxicillin-pot clavulanate] ? ?Medications: ?Prior to Admission medications   ?Medication Sig Start Date End Date Taking? Authorizing Provider  ?amLODipine (NORVASC) 5 MG tablet Take 1 tablet (5 mg total) by mouth 2 (two) times daily. Home med. 05/27/21   Enzo Bi, MD  ?baclofen (LIORESAL) 10 MG tablet Take 10 mg by mouth 3 (three) times daily as needed for muscle spasms.    [provider]  ?levothyroxine (SYNTHROID) 50 MCG tablet Take 50 mcg by mouth daily before breakfast.    [provider]  ?metoprolol tartrate (LOPRESSOR) 25 MG tablet Take 12.5 mg by mouth 2 (two) times daily.    [provider]  ?naproxen (NAPROSYN) 500 MG tablet Take 1 tablet (500 mg total) by mouth 2 (two) times daily with a meal. 08/29/21   Han, Aimee H, PA-C  ?ondansetron (ZOFRAN-ODT) 4 MG disintegrating tablet Take 4 mg by mouth every 8 (eight) hours as needed for nausea/vomiting.    [provider]  ?oxyCODONE-acetaminophen (PERCOCET) 5-325 MG tablet Take 1 tablet by mouth every 6 (six) hours  as needed for severe pain. 08/29/21   Han, Aimee H, PA-C  ?pantoprazole (PROTONIX) 20 MG tablet Take 20 mg by mouth 2 (two) times daily.    [provider]  ?  ? ?Family History  ?Problem Relation Age of Onset  ? Breast cancer Sister 64  ? ? ?Social History  ? ?Socioeconomic History  ? Marital status: Single  ?  Spouse name: Not on file  ? Number of children: Not on file  ? Years of education: Not on file  ? Highest education level: Not on file  ?Occupational History  ? Not on file  ?Tobacco Use  ? Smoking status: Never  ? Smokeless tobacco: Never  ?Vaping Use  ? Vaping Use: Never used  ?Substance and Sexual Activity  ? Alcohol use: Not Currently  ? Drug use: Never  ? Sexual activity: Not Currently  ?  Birth control/protection: Post-menopausal  ?Other Topics Concern  ? Not on file  ?  Social History Narrative  ? Not on file  ? ?Social Determinants of Health  ? ?Financial Resource Strain: Not on file  ?Food Insecurity: Not on file  ?Transportation Needs: Not on file  ?Physical Activity: Not on file  ?Stress: Not on file  ?Social Connections: Not on file  ? ?Review of Systems ? ?Review of Systems: A 12 point ROS discussed and pertinent positives are indicated in the HPI above.  All other systems are negative. ? ?Physical Exam ?No direct physical exam was performed (except for noted visual exam findings with Video Visits).  ?  ?Vital Signs: ?LMP  (LMP Unknown)  ? ?Imaging: ?MR LUMBAR SPINE WO CONTRAST ? ?Result Date: 09/04/2021 ?CLINICAL DATA:  Severe back pain with anterior and lateral abdominal pain, posterior hip pain since May 07, 2021 after a strain EXAM: MRI LUMBAR SPINE WITHOUT CONTRAST TECHNIQUE: Multiplanar, multisequence MR imaging of the lumbar spine was performed. No intravenous contrast was administered. COMPARISON:  Thoracic spine MRI 07/23/2021 lumbar spine radiographs 05/07/2021 FINDINGS: Segmentation: Standard; the lowest formed disc space is designated L5-S1. Alignment: There is S shaped  curvature with levocurvature centered at T11 and dextrocurvature centered at L3. There is no antero or retrolisthesis. Vertebrae: Background marrow signal is normal. Small intraosseous hemangiomas are see

## 2021-09-10 ENCOUNTER — Other Ambulatory Visit: Payer: Self-pay | Admitting: Physician Assistant

## 2021-09-10 MED ORDER — OXYCODONE-ACETAMINOPHEN 5-325 MG PO TABS
1.0000 | ORAL_TABLET | Freq: Four times a day (QID) | ORAL | 0 refills | Status: DC | PRN
Start: 2021-09-10 — End: 2021-09-10

## 2021-09-10 MED ORDER — OXYCODONE-ACETAMINOPHEN 5-325 MG PO TABS: 1.0000 | ORAL_TABLET | Freq: Four times a day (QID) | ORAL | 0 refills | Status: DC | PRN

## 2021-09-23 ENCOUNTER — Ambulatory Visit
Admission: RE | Admit: 2021-09-23 | Discharge: 2021-09-23 | Disposition: A | Discharge status: Home / Self Care | Payer: Medicare Other | Source: Ambulatory Visit | Attending: Interventional Radiology | Admitting: Interventional Radiology

## 2021-09-23 DIAGNOSIS — S32000A Wedge compression fracture of unspecified lumbar vertebra, initial encounter for closed fracture: Secondary | ICD-10-CM

## 2021-09-24 ENCOUNTER — Other Ambulatory Visit: Payer: Self-pay | Admitting: Neurosurgery

## 2021-09-24 ENCOUNTER — Ambulatory Visit
Admission: RE | Admit: 2021-09-24 | Discharge: 2021-09-24 | Disposition: A | Discharge status: Home / Self Care | Payer: Medicare Other | Source: Ambulatory Visit | Attending: Interventional Radiology | Admitting: Interventional Radiology

## 2021-09-24 DIAGNOSIS — G8929 Other chronic pain: Secondary | ICD-10-CM

## 2021-09-24 HISTORY — PX: IR RADIOLOGIST EVAL & MGMT: IMG5224

## 2021-09-24 NOTE — Progress Notes (Signed)
? ? ?Chief Complaint: ?Patient was consulted remotely today (TeleHealth) for T12 and L1 compression fractures with back pain at the request of Jeslyn Amsler K.   ? ?Referring Physician(s): ?Loleta Dicker ? ?History of Present Illness: ?Laura Cole is a 81 y.o. female who presented on 07/30/21 with a recent exacerbation of ongoing back pain since suffering a T12 compression fracture on May 07, 2021.  [name] was sitting on her bed on 20 December putting on her compression hose when she felt a sudden pop in her back followed by acute pain.  She recognized the sensation from a prior T8 compression fracture she had many years prior.  She underwent cement augmentation with kyphoplasty for the T8 fracture approximately 16 years ago.  While the fracture pain was resolved, she has since found it very uncomfortable to wear a bra and associates this with the kyphoplasty procedure.  For this reason, she initially sought conservative management.  She has a brace which she wears when she can tolerate it which is not very often.  She was beginning to feel better until approximately 3 weeks ago when she had a sudden onset of severe exacerbation of her pain which is not getting any better.  Recent MRI imaging demonstrates a subacute to chronic fracture at T12 with vertebral plana appearance and approximately 5 mm of posterior retropulsion effacing the anterior CSF space.  ?  ?Additionally, there is a more acute appearing fracture involving the superior endplate of L1 with approximately 20% height loss anteriorly with no evidence of retropulsion. ? ?She subsequently underwent L1 cement augmentation with balloon kyphoplasty on 08/12/2021.  This was performed by my partner, Dr. Corrie Mckusick.  She did well and was discharged home in good condition.  Since the time of her procedure, she has had some relief of her primary symptoms, however this has been intermittent.  Her symptoms continue to wax and wane.  She feels like  she has new pain lower down in her lumbar spine.  In particular, when she is sleeping and changing positions in bed she feels a new sensation that she is not familiar with and feels that there is something new going on.  She also remains tender in the region of T12 and L1, particularly when lying back against a firm surface.  ?  ?I initiated her on Percocet 5/325 every 6 hours as needed as well as naproxen 550 mg twice daily.  We also obtained an MRI of the lumbar spine to assess for a new fracture.  We have a follow-up conversation today to discuss the pain regimen and lumbar MRI findings. ? ?Fortunately, her lumbar MRI demonstrates no evidence of new fracture.  The intervention at L1 looks good.  No significant interval progression of the chronic fracture at T12 with significant residual posterior retropulsion.  Broad-based disc bulge at T12-L1 results in moderate spinal and right foraminal stenosis.  ?  ?Since her last visit, she has tried the naproxen again.  Unfortunately, this seems to cause bradycardia as well as intermittent palpitations which resolve when she stops taking the naproxen.  The OxyContin and baclofen continue to help.  Last week was a very good week and she had well-managed pain and required the medications minimally.  Unfortunately, this week, particularly yesterday, have been very busy and she was quite active trying to deal with an identity theft situation.  Today, her back is very sore because she overdid it.  She is comfortable while sitting in the chair but is afraid  to get up and move around. ? ? ? ?Past Medical History:  ?Diagnosis Date  ? Bradycardia   ? Cancer [facility])   ? skin cancer  ? Coronary artery disease   ? Diverticulitis   ? GERD (gastroesophageal reflux disease)   ? Heart murmur   ? Hemorrhoids   ? Hypertension   ? Hypothyroidism   ? Osteoarthritis   ? Peripheral vascular disease ([place])   ? TIA (transient ischemic attack)   ? ? ?Past Surgical History:  ?Procedure Laterality Date   ? ABDOMINAL HYSTERECTOMY    ? BACK SURGERY    ? CHOLECYSTECTOMY    ? COLON SURGERY    ? COLONOSCOPY WITH PROPOFOL N/A 12/05/2019  ? Procedure: COLONOSCOPY WITH PROPOFOL;  Surgeon: Lesly Rubenstein, MD;  Location: [facility] ENDOSCOPY;  Service: Endoscopy;  Laterality: N/A;  ? ESOPHAGOGASTRODUODENOSCOPY (EGD) WITH PROPOFOL N/A 12/05/2019  ? Procedure: ESOPHAGOGASTRODUODENOSCOPY (EGD) WITH PROPOFOL;  Surgeon: Lesly Rubenstein, MD;  Location: [facility] ENDOSCOPY;  Service: Endoscopy;  Laterality: N/A;  ? EYE SURGERY    ? IR KYPHO LUMBAR INC FX REDUCE BONE BX UNI/BIL CANNULATION INC/IMAGING  08/12/2021  ? IR RADIOLOGIST EVAL & MGMT  07/30/2021  ? IR RADIOLOGIST EVAL & MGMT  09/05/2021  ? IR RADIOLOGIST EVAL & MGMT  09/24/2021  ? RECTOCELE REPAIR    ? RECTOCELE REPAIR    ? REPLACEMENT TOTAL KNEE Bilateral   ? TKRX2 Bilateral   ? WRIST SURGERY    ? ? ?Allergies: ?Mushroom extract complex, Promethazine, Alprazolam, Cefdinir, Ciprofloxacin, Dilaudid [hydromorphone hcl], Hydromorphone, Phenergan [promethazine hcl], Pravastatin, and Augmentin [amoxicillin-pot clavulanate] ? ?Medications: ?Prior to Admission medications   ?Medication Sig Start Date End Date Taking? Authorizing Provider  ?amLODipine (NORVASC) 5 MG tablet Take 1 tablet (5 mg total) by mouth 2 (two) times daily. Home med. 05/27/21   Enzo Bi, MD  ?baclofen (LIORESAL) 10 MG tablet Take 10 mg by mouth 3 (three) times daily as needed for muscle spasms.    [provider]  ?levothyroxine (SYNTHROID) 50 MCG tablet Take 50 mcg by mouth daily before breakfast.    [provider]  ?metoprolol tartrate (LOPRESSOR) 25 MG tablet Take 12.5 mg by mouth 2 (two) times daily.    [provider]  ?naproxen (NAPROSYN) 500 MG tablet Take 1 tablet (500 mg total) by mouth 2 (two) times daily with a meal. 08/29/21   Han, Aimee H, PA-C  ?ondansetron (ZOFRAN-ODT) 4 MG disintegrating tablet Take 4 mg by mouth every 8 (eight) hours as needed for nausea/vomiting.    [provider]  ?oxyCODONE-acetaminophen (PERCOCET) 5-325 MG tablet Take 1 tablet by mouth every 6 (six) hours as needed for severe pain. 09/10/21   Boisseau, Angie Fava, PA  ?pantoprazole (PROTONIX) 20 MG tablet Take 20 mg by mouth 2 (two) times daily.    [provider]  ?  ? ?Family History  ?Problem Relation Age of Onset  ? Breast cancer Sister 34  ? ? ?Social History  ? ?Socioeconomic History  ? Marital status: Single  ?  Spouse name: Not on file  ? Number of children: Not on file  ? Years of education: Not on file  ? Highest education level: Not on file  ?Occupational History  ? Not on file  ?Tobacco Use  ? Smoking status: Never  ? Smokeless tobacco: Never  ?Vaping Use  ? Vaping Use: Never used  ?Substance and Sexual Activity  ? Alcohol use: Not Currently  ? Drug use: Never  ?  Sexual activity: Not Currently  ?  Birth control/protection: Post-menopausal  ?Other Topics Concern  ? Not on file  ?Social History Narrative  ? Not on file  ? ?Social Determinants of Health  ? ?Financial Resource Strain: Not on file  ?Food Insecurity: Not on file  ?Transportation Needs: Not on file  ?Physical Activity: Not on file  ?Stress: Not on file  ?Social Connections: Not on file  ? ? ?Review of Systems ? ?Review of Systems: A 12 point ROS discussed and pertinent positives are indicated in the HPI above.  All other systems are negative. ? ?Physical Exam ?No direct physical exam was performed (except for noted visual exam findings with Video Visits).  ?  ?Vital Signs: ?LMP  (LMP Unknown)  ? ?Imaging: ?MR LUMBAR SPINE WO CONTRAST ? ?Result Date: 09/04/2021 ?CLINICAL DATA:  Severe back pain with anterior and lateral abdominal pain, posterior hip pain since May 07, 2021 after a strain EXAM: MRI LUMBAR SPINE WITHOUT CONTRAST TECHNIQUE: Multiplanar, multisequence MR imaging of the lumbar spine was performed. No intravenous contrast was administered. COMPARISON:  Thoracic spine MRI 07/23/2021 lumbar spine radiographs 05/07/2021  FINDINGS: Segmentation: Standard; the lowest formed disc space is designated L5-S1. Alignment: There is S shaped curvature with levocurvature centered at T11 and dextrocurvature centered at L3. Ther

## 2021-10-08 ENCOUNTER — Other Ambulatory Visit: Payer: Self-pay | Admitting: Neurosurgery

## 2021-10-08 ENCOUNTER — Ambulatory Visit
Admission: RE | Admit: 2021-10-08 | Discharge: 2021-10-08 | Disposition: A | Discharge status: Home / Self Care | Payer: Medicare Other | Source: Ambulatory Visit | Attending: Neurosurgery | Admitting: Neurosurgery

## 2021-10-08 DIAGNOSIS — M545 Low back pain, unspecified: Secondary | ICD-10-CM

## 2021-10-08 IMAGING — XA Imaging study
8 series · 8 of 8 positions shown · non-contrast
Comparison: none

CLINICAL DATA: Chronic low back pain, unspecified back pain
laterality, unspecified whether sciatica present

[Series 1: standard · 1 of 1 slices shown (1 of 8)]
[im 1/1]
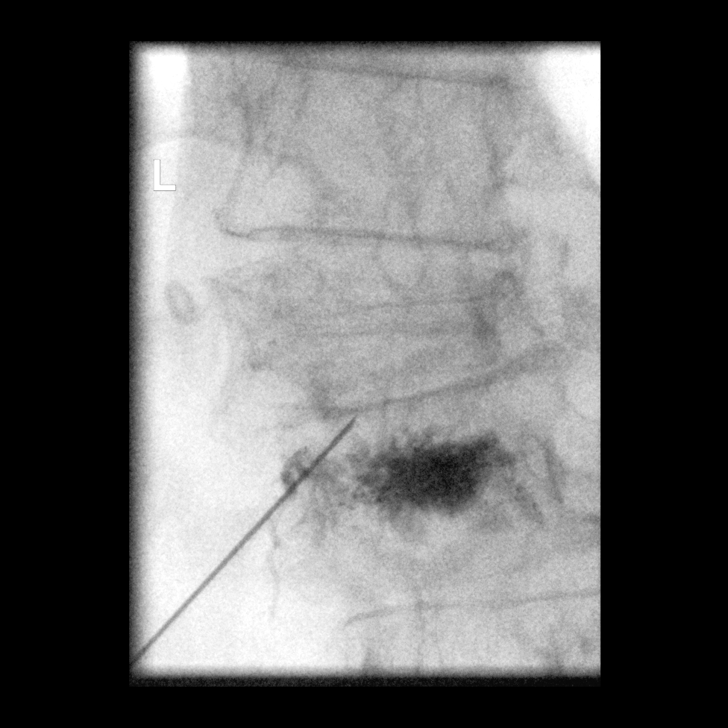

[Series 2: standard · 1 of 1 slices shown (2 of 8)]
[im 1/1]
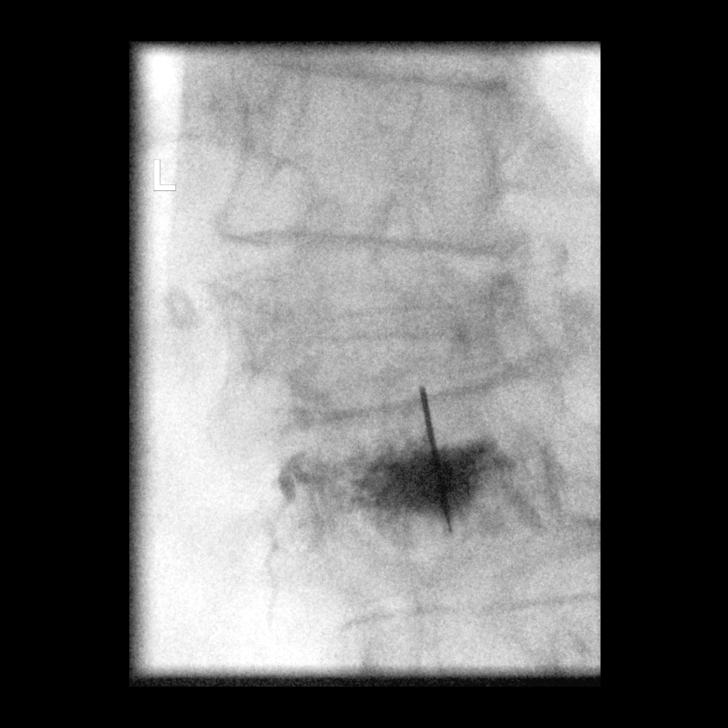

[Series 3: standard · 1 of 1 slices shown (3 of 8)]
[im 1/1]
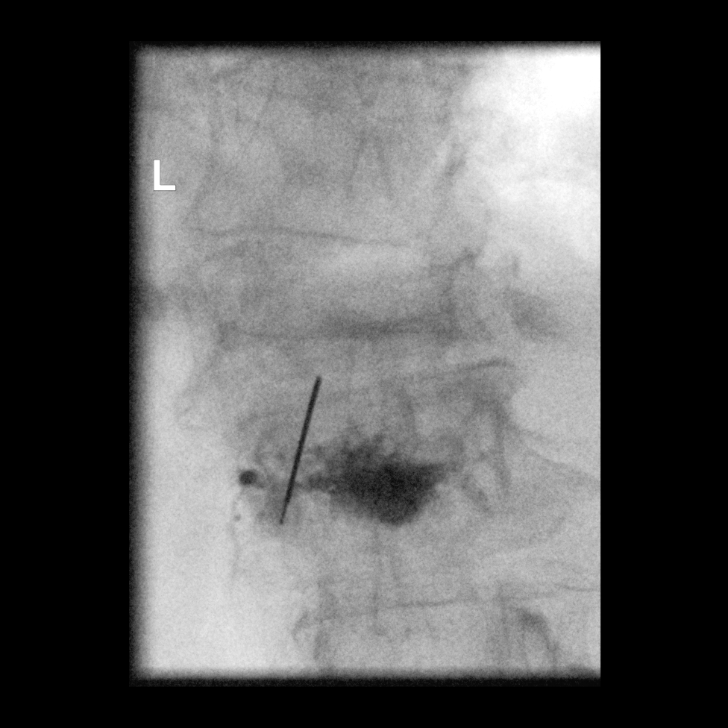

[Series 4: standard · 1 of 1 slices shown (4 of 8)]
[im 1/1]
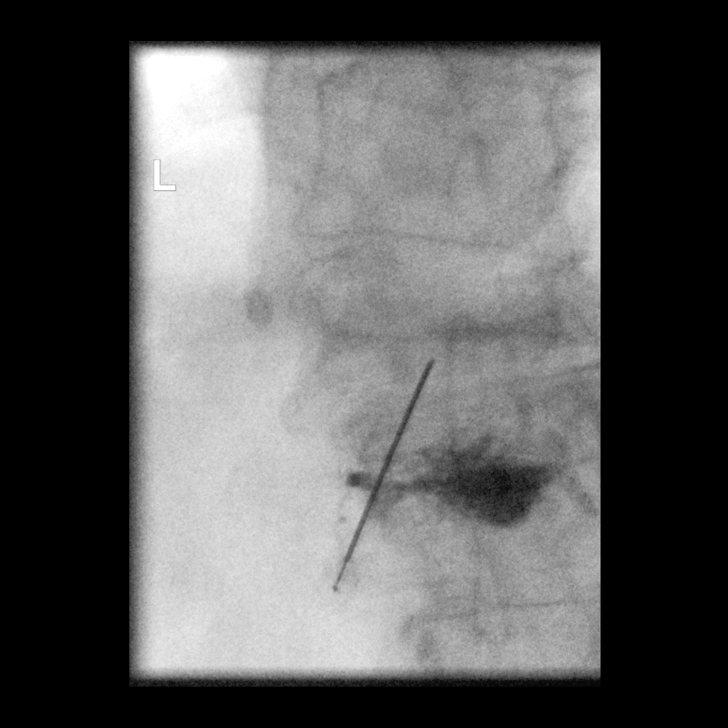

[Series 5: standard · 1 of 1 slices shown (5 of 8)]
[im 1/1]
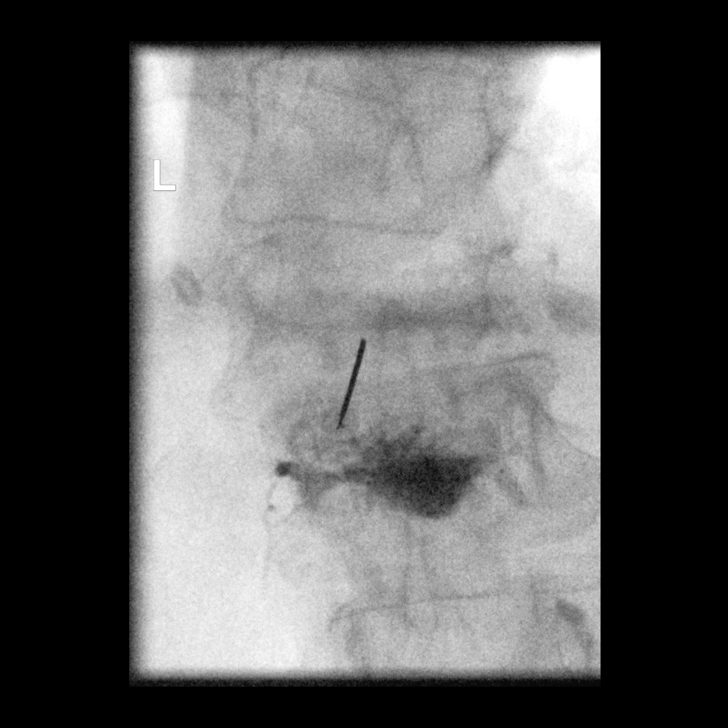

[Series 6: standard · 1 of 1 slices shown (6 of 8)]
[im 1/1]
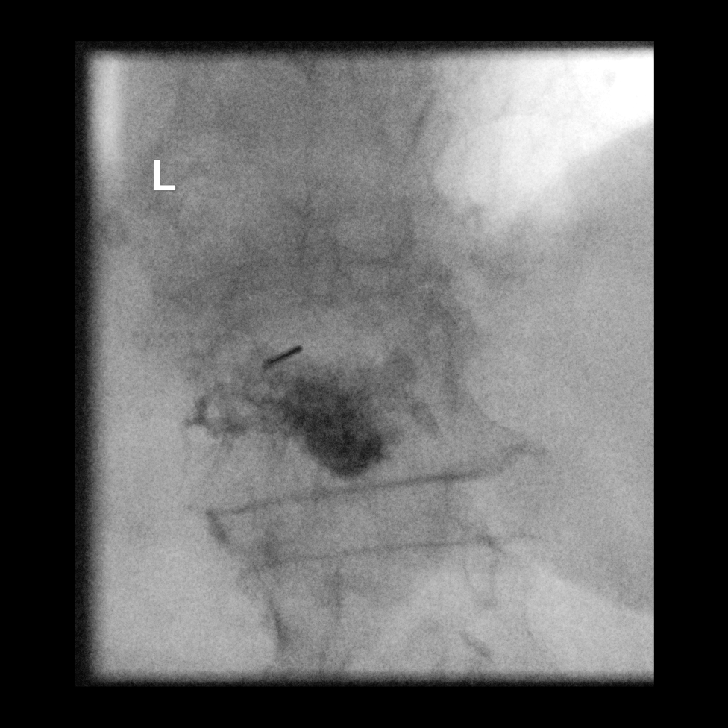

[Series 7: standard · 1 of 1 slices shown (7 of 8)]
[im 1/1]
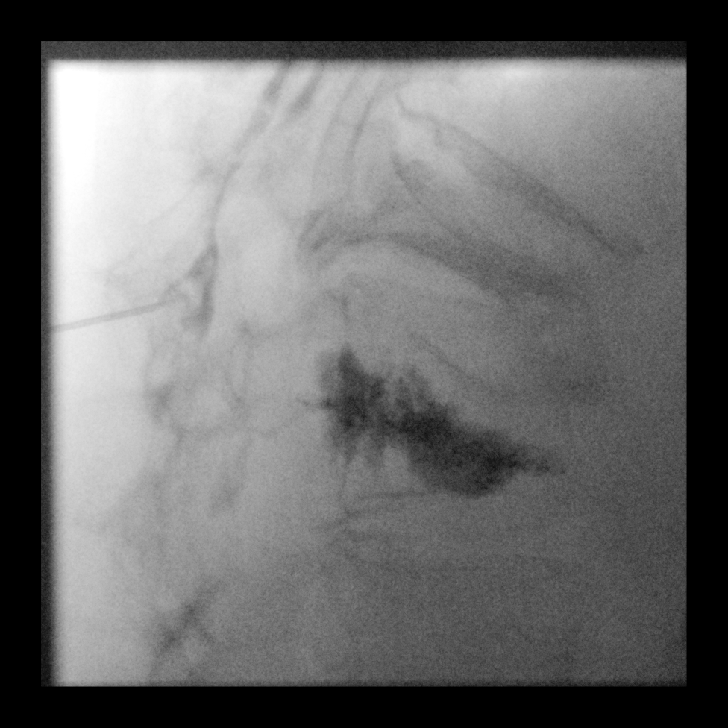

[Series 8: standard · 1 of 1 slices shown (8 of 8)]
[im 1/1]
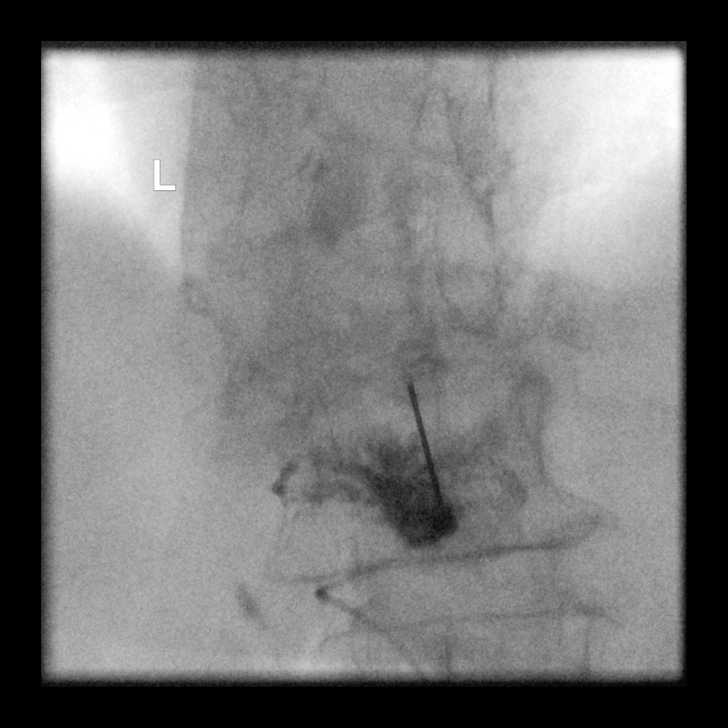

[8 of 8 positions shown; findings below may reference images not displayed]

FLUOROSCOPY:
Radiation Exposure Index (as provided by the fluoroscopic device):
7.2 minutes (43 mGy)

PROCEDURE:
The procedure, risks, benefits, and alternatives were explained to
the patient. Questions regarding the procedure were encouraged and
answered. The patient understands and consents to the procedure.

LUMBAR EPIDURAL INJECTION:

An interlaminar approach was performed at T12-L1. The overlying skin
was cleansed and anesthetized. A 20 gauge epidural needle was
advanced using loss-of-resistance technique.

DIAGNOSTIC EPIDURAL INJECTION:

Injection of Isovue-M 200 shows a good epidural pattern with spread
above and below the level of needle placement. No vascular
opacification is seen.

THERAPEUTIC EPIDURAL INJECTION:

80 Mg of Depo-Medrol mixed with 2 mL 1% lidocaine were instilled.
The procedure was well-tolerated, and the patient was discharged
thirty minutes following the injection in good condition.

COMPLICATIONS:
None immediate
IMPRESSION: Technically successful epidural steroid injection at T12-L1 via
interlaminar approach.

## 2021-10-08 IMAGING — DX DG LUMBAR SPINE 2-3V
3 series · 3 of 3 positions shown · non-contrast
Comparison: None Available.

CLINICAL DATA: Acute low back pain, unspecified back pain
laterality, unspecified whether sciatica present

EXAM:
THORACIC SPINE - 3 VIEWS; LUMBAR SPINE - 2-3 VIEW

[w lumbar spine ap]
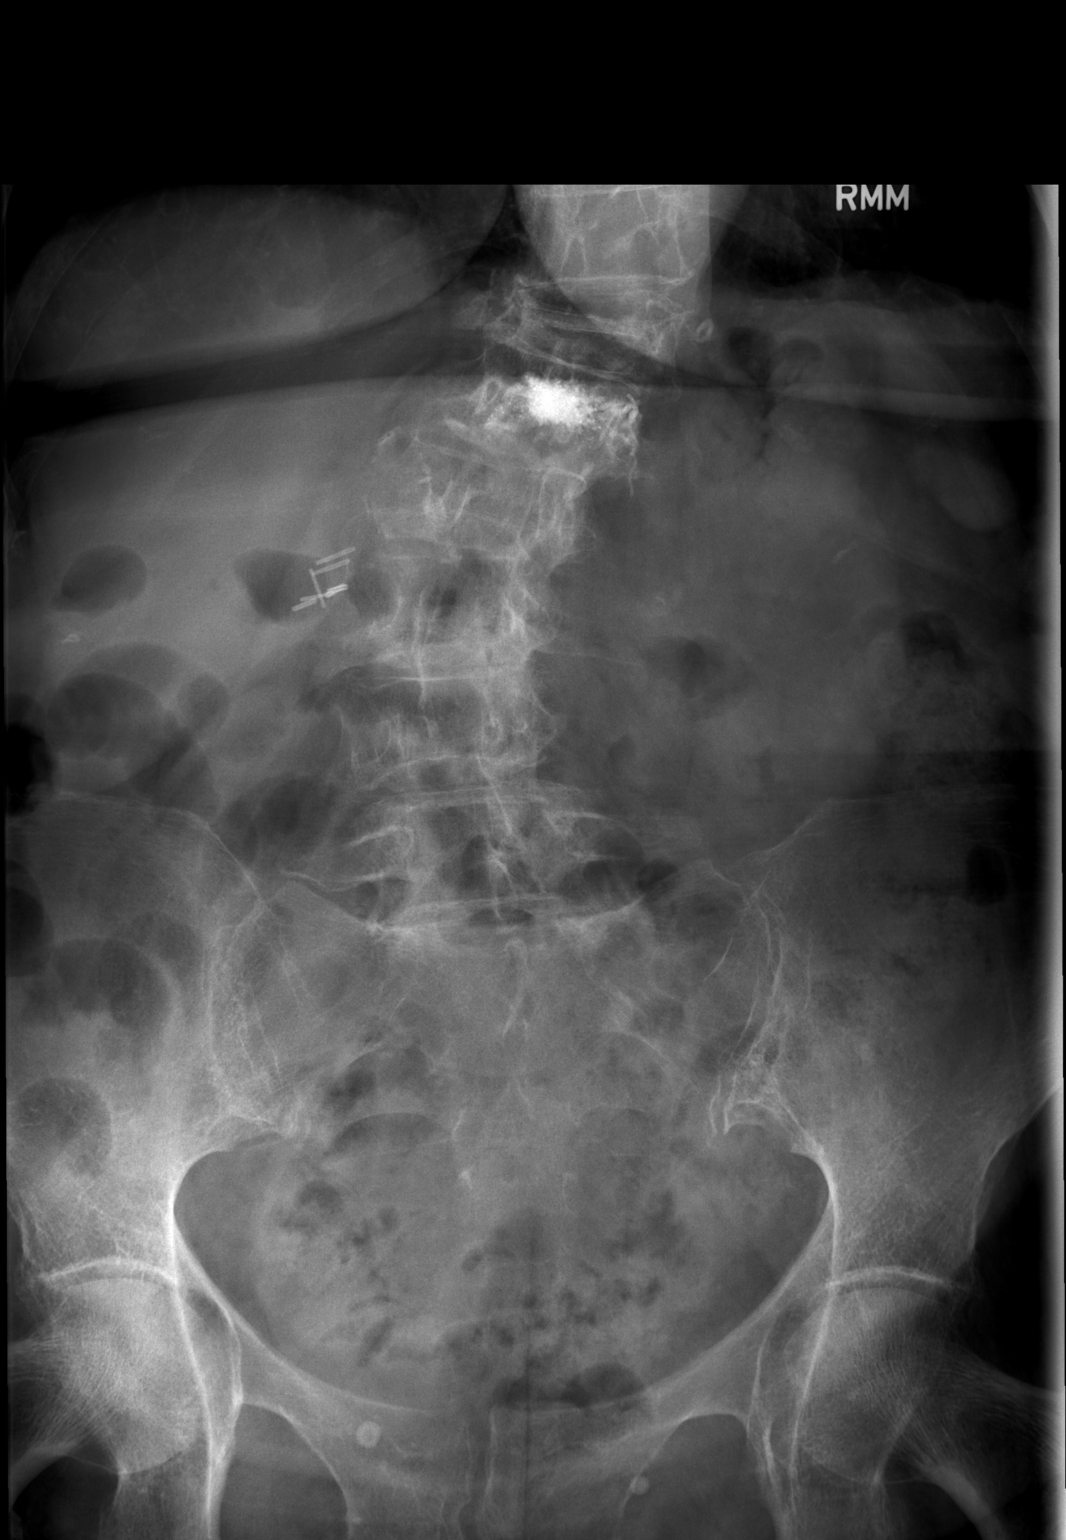

[w lumbar spine lat]
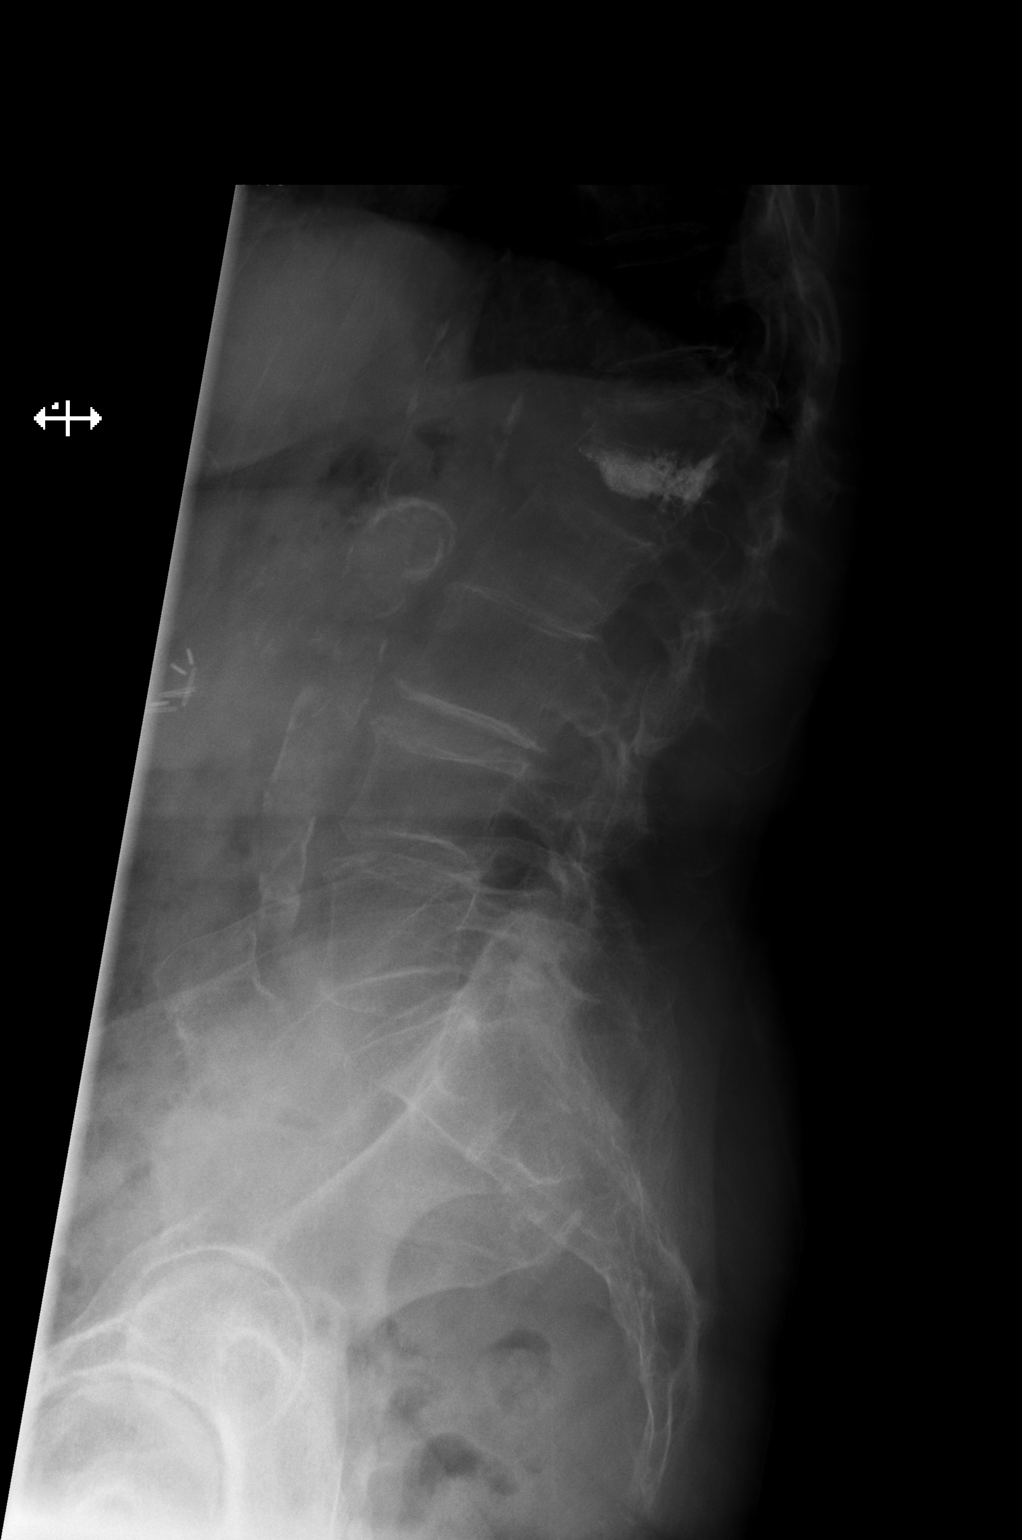

[w lumbar l-5 s-1 spot]
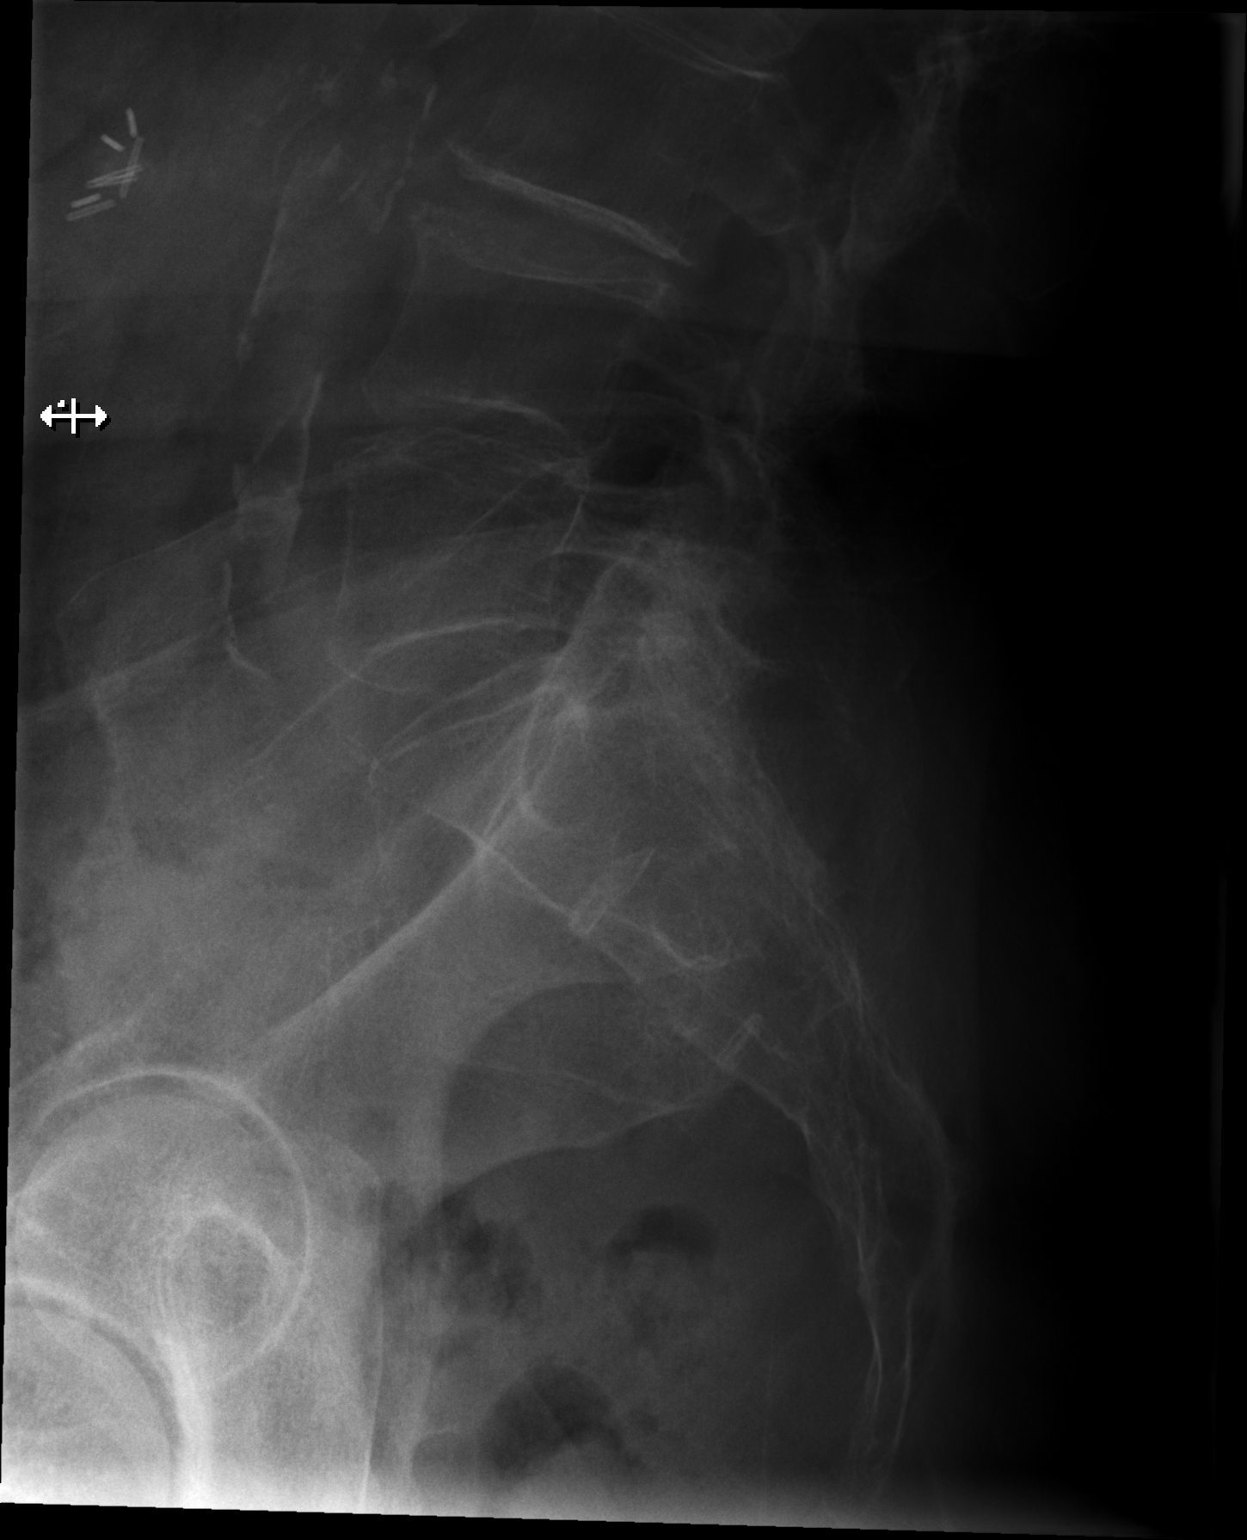

[3 of 3 positions shown; findings below may reference images not displayed]

FINDINGS: Thoracic spine:

Sigmoid scoliosis of the thoracolumbar spine. Previous
vertebroplasty at T8. Chronic compression deformity of T12 with
similar severe height loss. No new definite compression deformity of
the thoracic spine. Osteopenia. Visualized soft tissues are
unremarkable.

Lumbar spine:

Sigmoid scoliosis of the thoracolumbar spine. Previous
vertebroplasty at L1. Questionable irregularity of the L2 superior
endplate. No new definite compression deformity. Osteopenia.
Atherosclerosis of the abdominal aorta. Surgical clips in the right
upper quadrant.
IMPRESSION: Thoracic spine, lumbar spine:

1. No new definite acute compression deformity. There is
questionable and subtle irregularity of the L2 superior endplate. If
there is persistent clinical concern for acute fracture, consider
short-term follow-up imaging or MRI as appropriate.
2. Sigmoid scoliosis of the thoracolumbar spine with stable findings
of T8 and L1 vertebroplasty, and chronic severe compression
deformity of the T12 vertebral body.

## 2021-10-08 IMAGING — DX DG THORACIC SPINE 3V
3 series · 3 of 3 positions shown · non-contrast
Comparison: None Available.

CLINICAL DATA: Acute low back pain, unspecified back pain
laterality, unspecified whether sciatica present

EXAM:
THORACIC SPINE - 3 VIEWS; LUMBAR SPINE - 2-3 VIEW

[w thoracic spine ap]
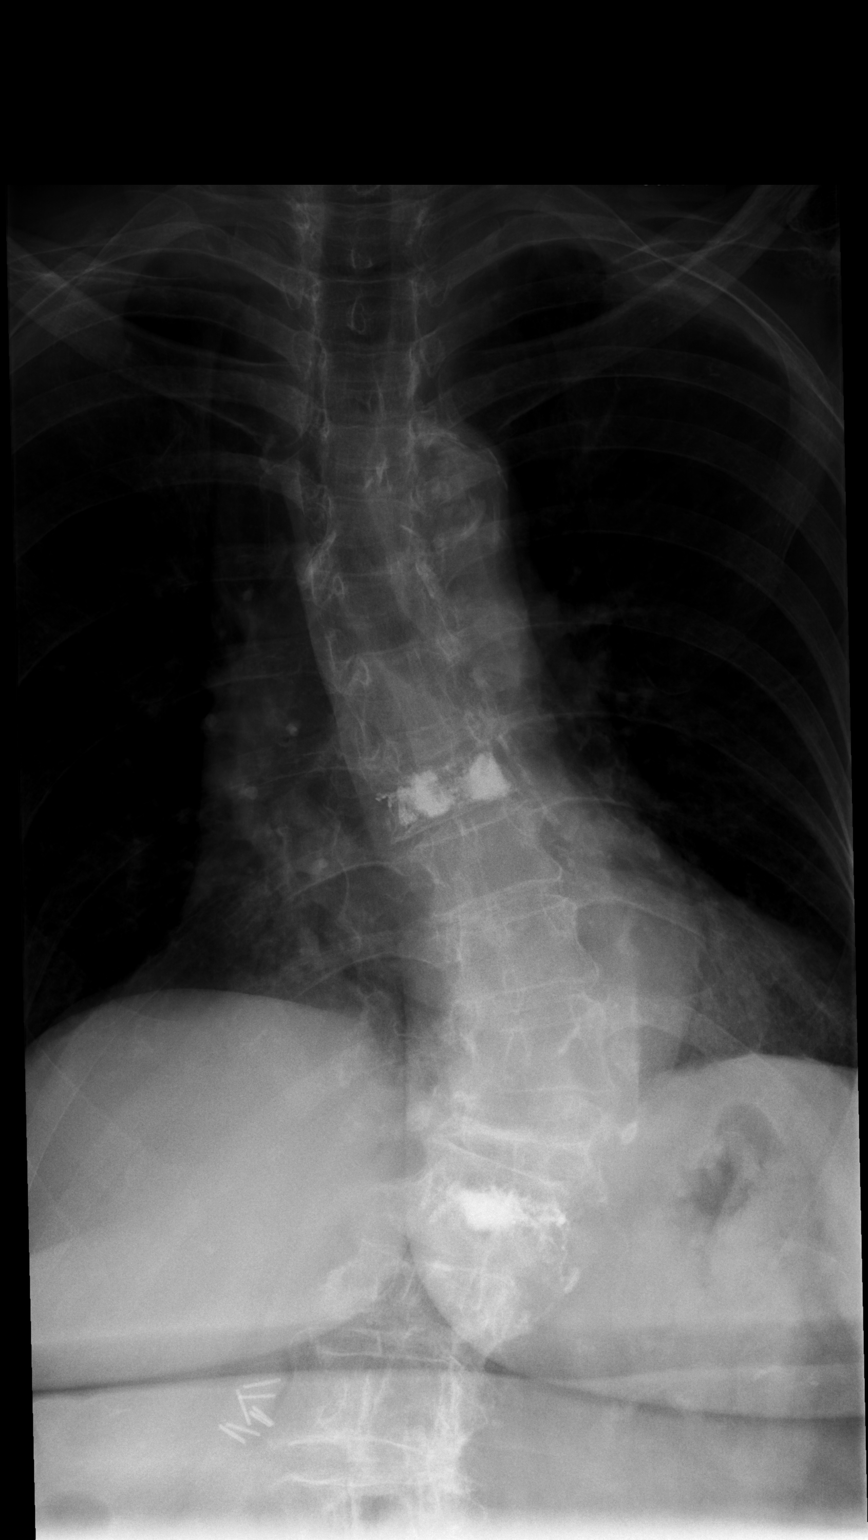

[w thoracic spine lat]
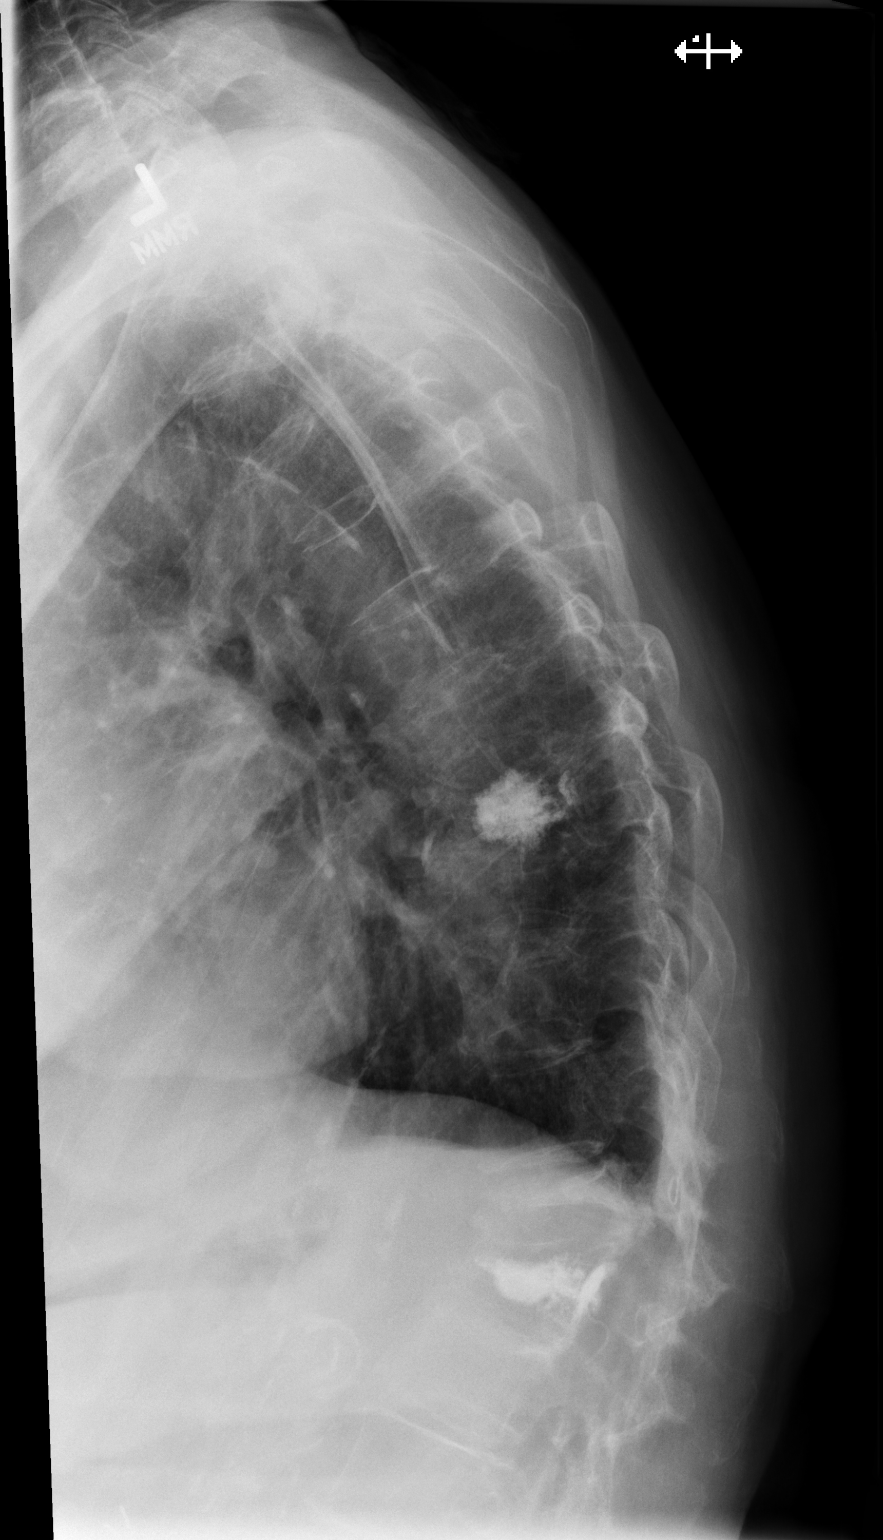

[w thoracic swimmers]
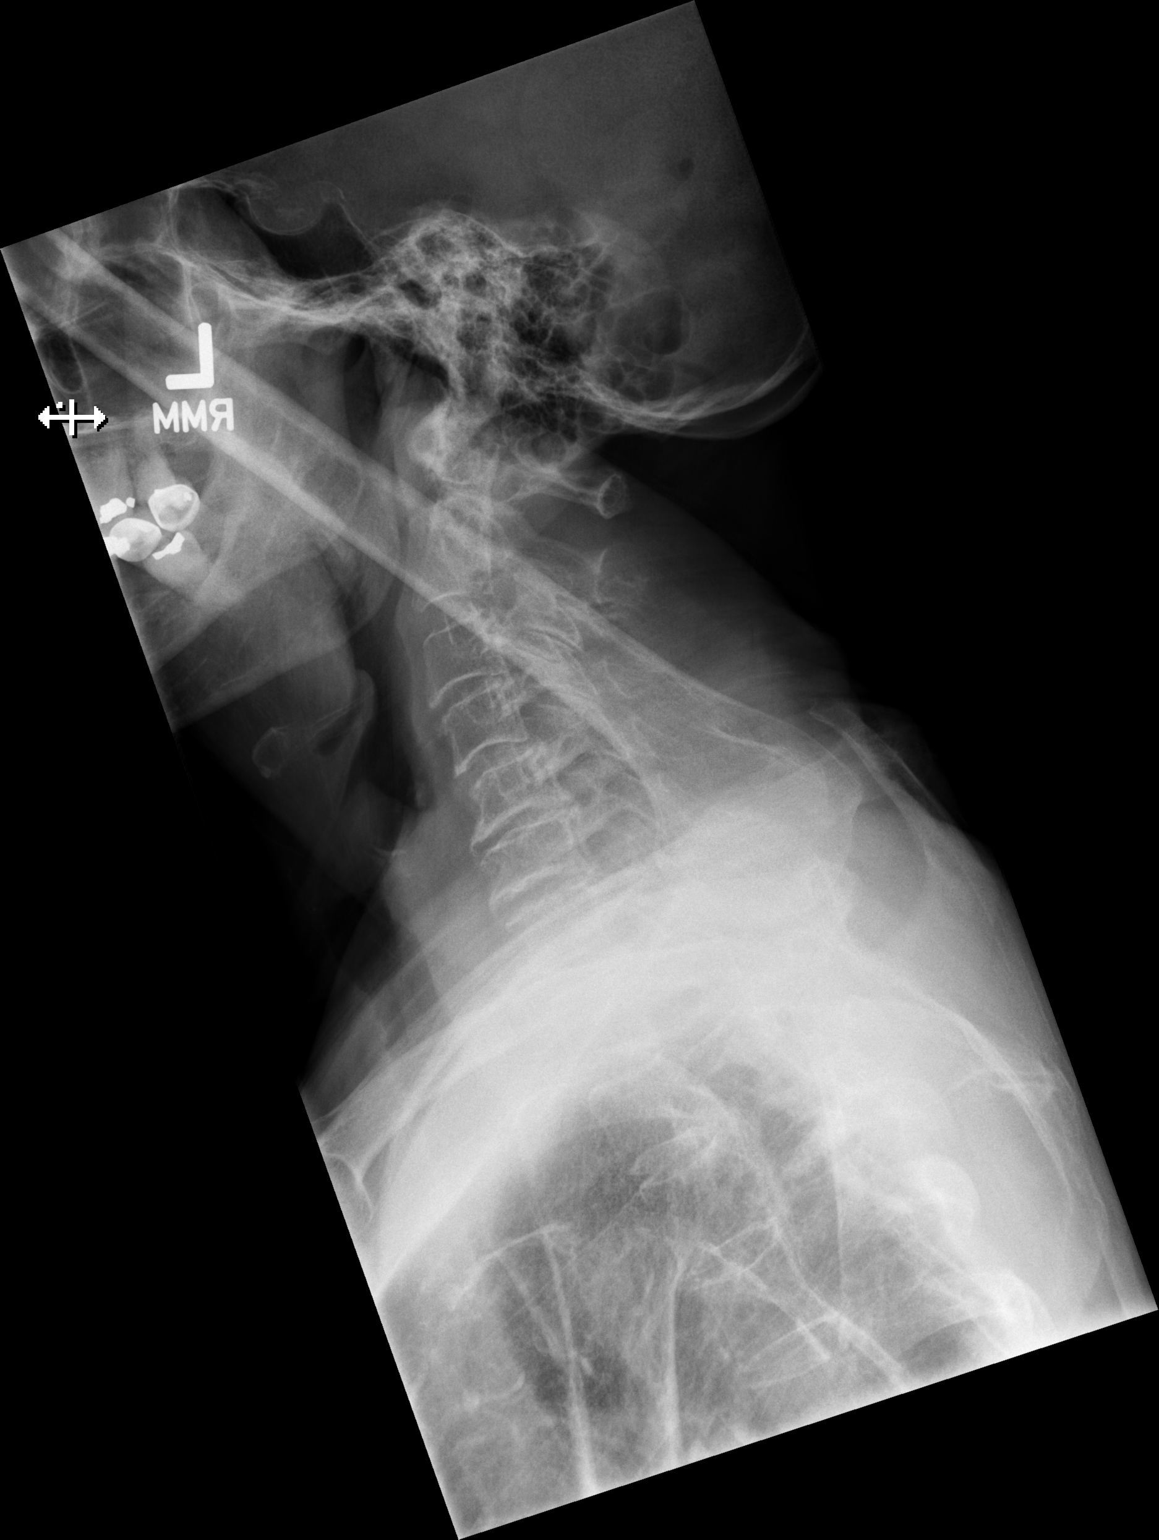

[3 of 3 positions shown; findings below may reference images not displayed]

FINDINGS: Thoracic spine:

Sigmoid scoliosis of the thoracolumbar spine. Previous
vertebroplasty at T8. Chronic compression deformity of T12 with
similar severe height loss. No new definite compression deformity of
the thoracic spine. Osteopenia. Visualized soft tissues are
unremarkable.

Lumbar spine:

Sigmoid scoliosis of the thoracolumbar spine. Previous
vertebroplasty at L1. Questionable irregularity of the L2 superior
endplate. No new definite compression deformity. Osteopenia.
Atherosclerosis of the abdominal aorta. Surgical clips in the right
upper quadrant.
IMPRESSION: Thoracic spine, lumbar spine:

1. No new definite acute compression deformity. There is
questionable and subtle irregularity of the L2 superior endplate. If
there is persistent clinical concern for acute fracture, consider
short-term follow-up imaging or MRI as appropriate.
2. Sigmoid scoliosis of the thoracolumbar spine with stable findings
of T8 and L1 vertebroplasty, and chronic severe compression
deformity of the T12 vertebral body.

## 2021-10-08 MED ORDER — IOPAMIDOL (ISOVUE-M 200) INJECTION 41%
1.0000 mL | Freq: Once | INTRAMUSCULAR | Status: AC
  Administered 2021-10-08: 1 mL via EPIDURAL

## 2021-10-08 MED ORDER — METHYLPREDNISOLONE ACETATE 40 MG/ML INJ SUSP (RADIOLOG
80.0000 mg | Freq: Once | INTRAMUSCULAR | Status: AC
  Administered 2021-10-08: 80 mg via EPIDURAL

## 2021-10-08 NOTE — Discharge Instructions (Signed)

## 2021-10-11 ENCOUNTER — Other Ambulatory Visit: Payer: Self-pay | Admitting: Neurosurgery

## 2021-10-11 DIAGNOSIS — M4856XA Collapsed vertebra, not elsewhere classified, lumbar region, initial encounter for fracture: Secondary | ICD-10-CM

## 2021-10-16 ENCOUNTER — Ambulatory Visit
Admission: RE | Admit: 2021-10-16 | Discharge: 2021-10-16 | Disposition: A | Discharge status: Home / Self Care | Payer: Medicare Other | Source: Ambulatory Visit | Attending: Neurosurgery | Admitting: Neurosurgery

## 2021-10-16 DIAGNOSIS — M4856XA Collapsed vertebra, not elsewhere classified, lumbar region, initial encounter for fracture: Secondary | ICD-10-CM

## 2021-10-16 IMAGING — MR MR LUMBAR SPINE W/O CM
4 of 6 series · 20 of 48 positions shown · non-contrast
Comparison: [DATE]
COMPARISON: [DATE]

Addendum:
CLINICAL DATA: History of compression fracture. History of
kyphoplasty in [REDACTED]

EXAM:
MRI LUMBAR SPINE WITHOUT CONTRAST
TECHNIQUE: Multiplanar, multisequence MR imaging of the lumbar spine was
performed. No intravenous contrast was administered.

[Series 6: T2 · sagittal · 4.0mm · 0.73mm/px · 4 of 15 slices shown (1 of 3)]
[im 1/15]
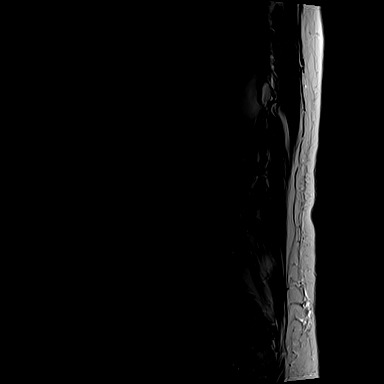
[im 5/15]
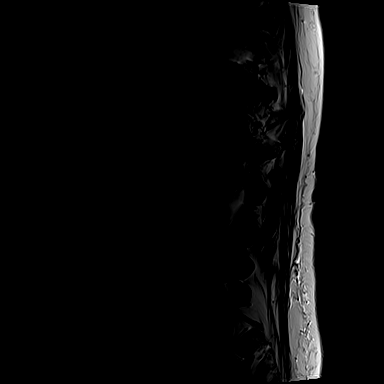
[im 10/15]
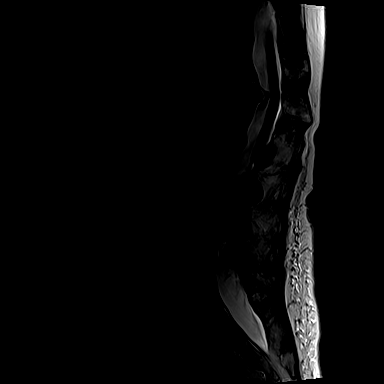
[im 15/15]
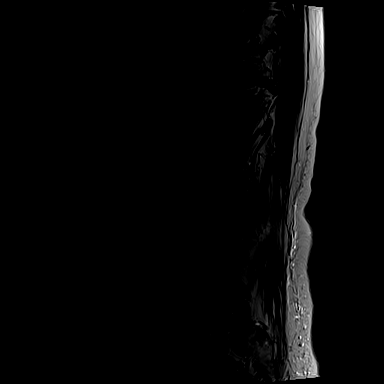

[Series 7: T1 · sagittal · 4.0mm · 0.73mm/px · 3 of 15 slices shown]
[im 1/15]
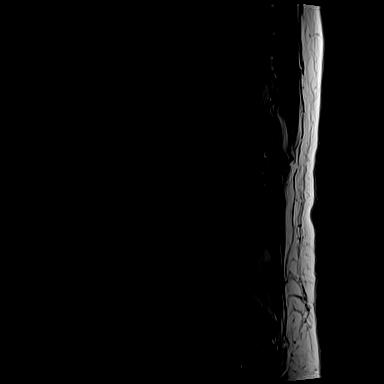
[im 8/15]
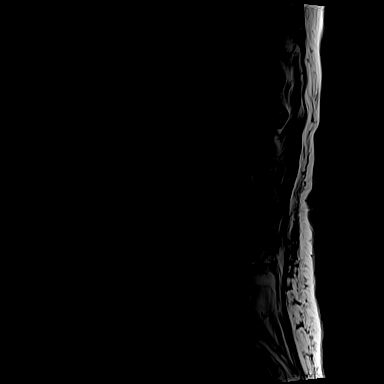
[im 15/15]
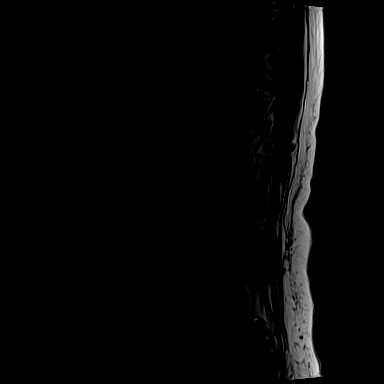

[Series 10: T2 · coronal · 5.0mm · 0.73mm/px · 6 of 17 slices shown (2 of 3)]
[im 1/17]
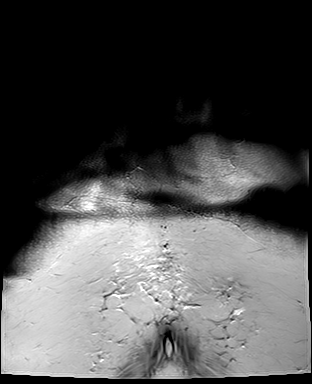
[im 4/17]
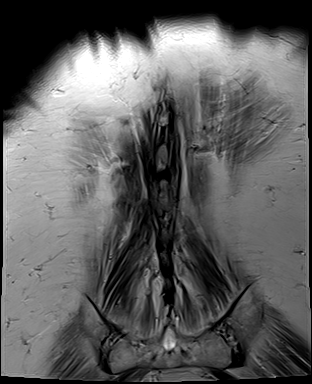
[im 7/17]
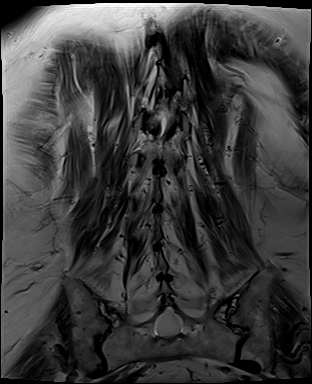
[im 10/17]
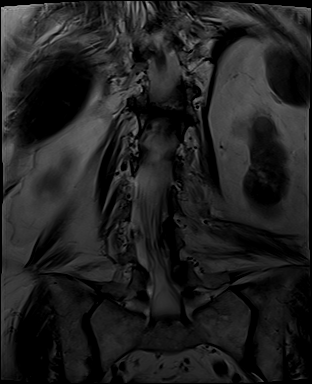
[im 13/17]
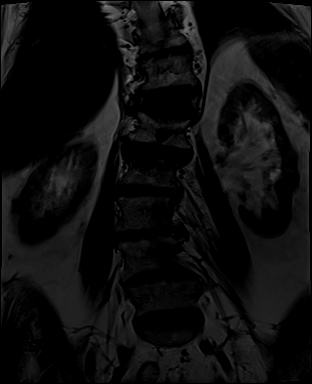
[im 17/17]
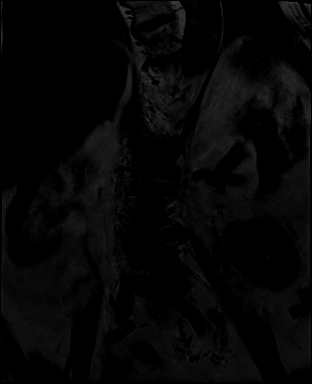

[Series 15: T2 · axial · 4.0mm · 0.28mm/px · z∈[-50,+126]mm · 7 of 40 slices shown (3 of 3)]
[im 1/40]
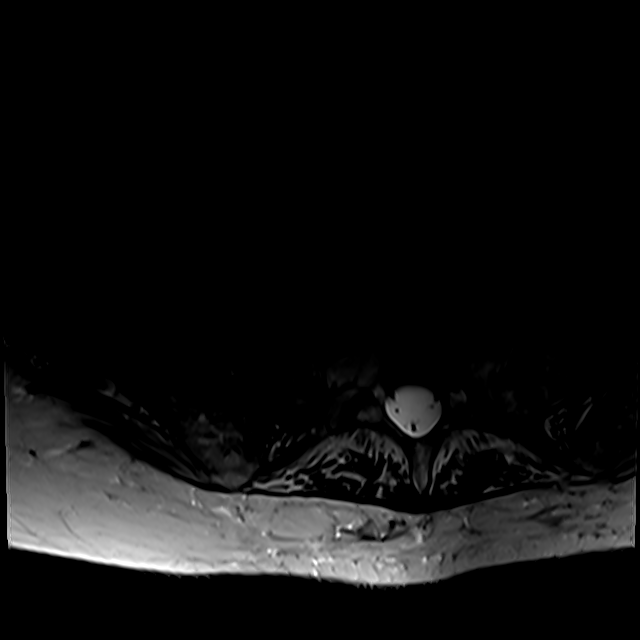
[im 7/40]
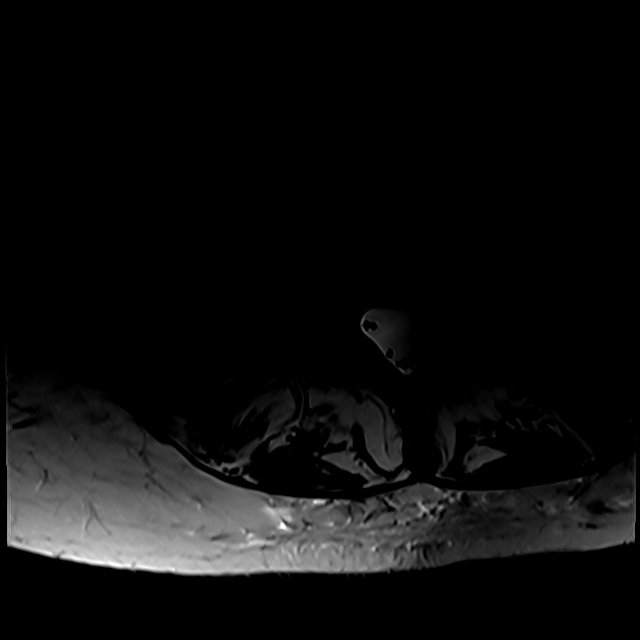
[im 13/40]
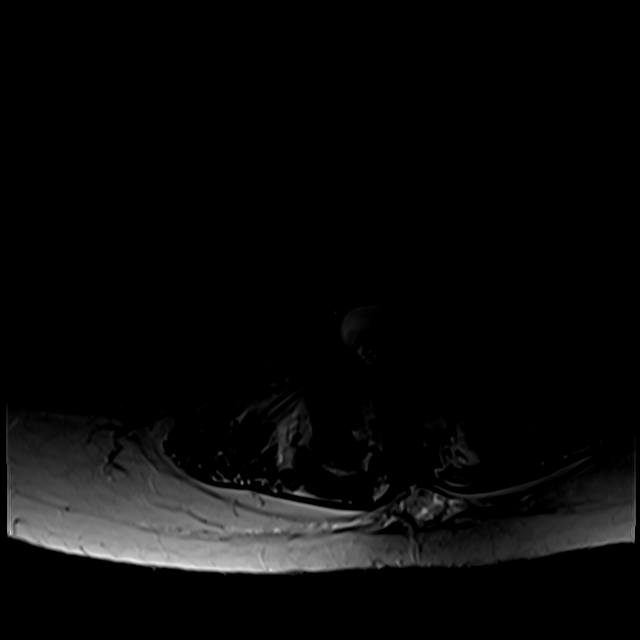
[im 19/40]
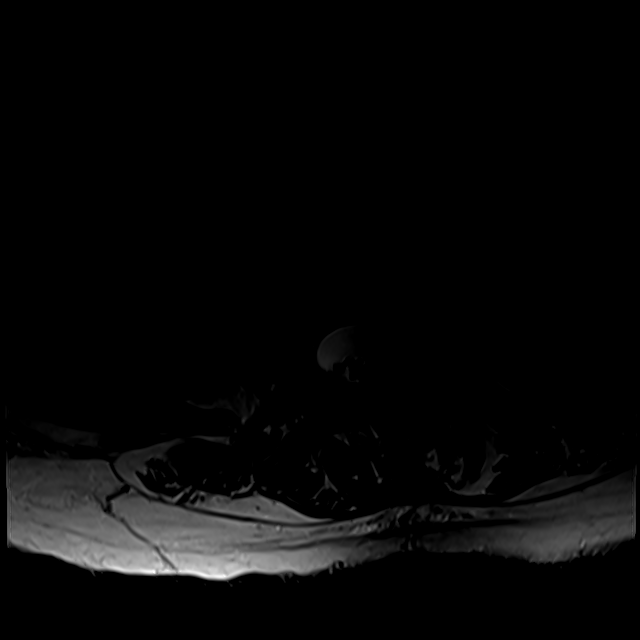
[im 22/40]
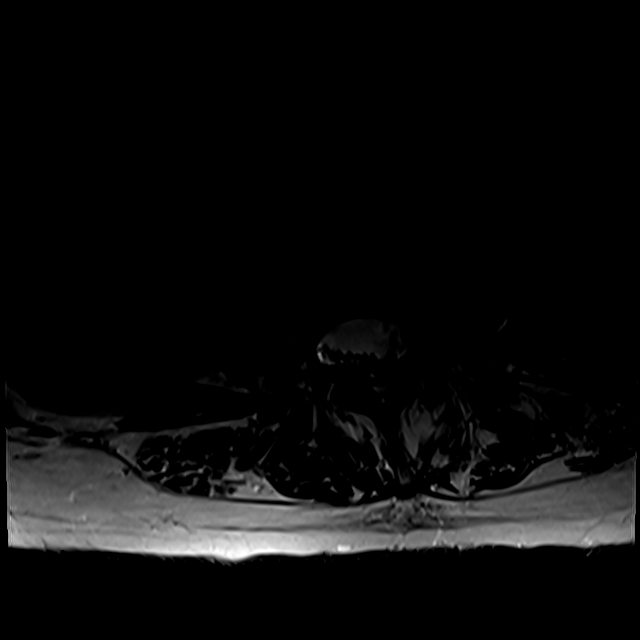
[im 28/40]
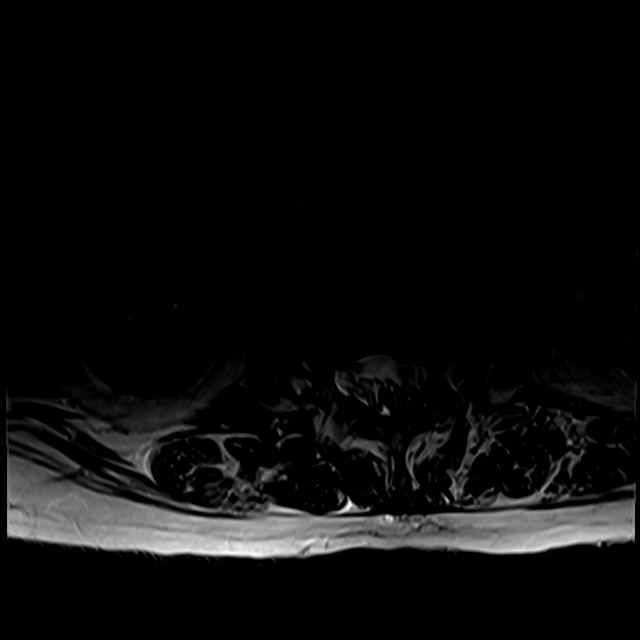
[im 34/40]
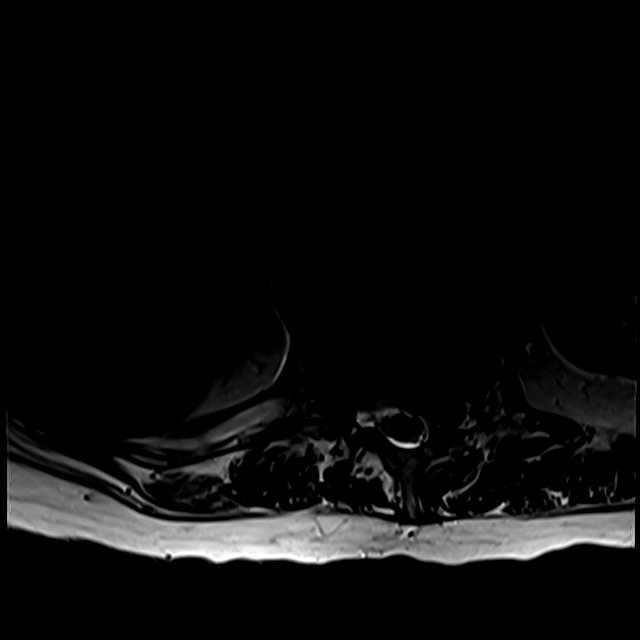

[20 of 48 positions shown; findings below may reference images not displayed]

FINDINGS: Segmentation:  5 lumbar type vertebrae

Alignment:  Dextroscoliosis.  Mild T12-L1 anterolisthesis.

Vertebrae: Horizontal fracture plane to the L2 body with mild
superior endplate depression. The fracture cleft contains fluid.
Marrow edema mildly extends into the right pedicle.

Remote T12 compression fracture with cement augmentation and healed
posteroinferior corner retropulsion. No evidence of aggressive bone
lesion.

Conus medullaris and cauda equina: Conus extends to the L2 level.
Conus and cauda equina appear normal.

Paraspinal and other soft tissues: Diffuse fatty atrophy of
intrinsic back muscles

Disc levels:

T12- L1: Posttraumatic deformity of the T12 body with retropulsed
bone impinging on the right foramen. Bone also contacts the ventral
cord without compression

L1-L2: Mild disc bulging.

L2-L3: Disc narrowing and bulging with small right foraminal
protrusion. No neural compression

L3-L4: Disc narrowing and bulging. Negative facets. No neural
compression.

L4-L5: Disc narrowing and bulging. Negative facets. No neural
compression

L5-S1:Unremarkable.

No axial T1 weighted imaging. Interrogation has been made and these
will be reviewed if they were obtained by the technologist.
IMPRESSION: 1. Acute L2 compression fracture with mild height loss.
2. Remote T12 compression fracture with healed retropulsion causing
advanced right foraminal impingement and mild crowding of the cord.
3. Lumbar spine degeneration with mild scoliosis. No degenerative
impingement.

ADDENDUM:
Axial T1 weighted images are now available and no additional finding
is noted.

*** End of Addendum ***
FINDINGS: Segmentation:  5 lumbar type vertebrae

Alignment:  Dextroscoliosis.  Mild T12-L1 anterolisthesis.

Vertebrae: Horizontal fracture plane to the L2 body with mild
superior endplate depression. The fracture cleft contains fluid.
Marrow edema mildly extends into the right pedicle.

Remote T12 compression fracture with cement augmentation and healed
posteroinferior corner retropulsion. No evidence of aggressive bone
lesion.

Conus medullaris and cauda equina: Conus extends to the L2 level.
Conus and cauda equina appear normal.

Paraspinal and other soft tissues: Diffuse fatty atrophy of
intrinsic back muscles

Disc levels:

T12- L1: Posttraumatic deformity of the T12 body with retropulsed
bone impinging on the right foramen. Bone also contacts the ventral
cord without compression

L1-L2: Mild disc bulging.

L2-L3: Disc narrowing and bulging with small right foraminal
protrusion. No neural compression

L3-L4: Disc narrowing and bulging. Negative facets. No neural
compression.

L4-L5: Disc narrowing and bulging. Negative facets. No neural
compression

L5-S1:Unremarkable.

No axial T1 weighted imaging. Interrogation has been made and these
will be reviewed if they were obtained by the technologist.
IMPRESSION: 1. Acute L2 compression fracture with mild height loss.
2. Remote T12 compression fracture with healed retropulsion causing
advanced right foraminal impingement and mild crowding of the cord.
3. Lumbar spine degeneration with mild scoliosis. No degenerative
impingement.

## 2021-10-31 ENCOUNTER — Other Ambulatory Visit: Payer: Self-pay | Admitting: Neurosurgery

## 2021-10-31 DIAGNOSIS — G8929 Other chronic pain: Secondary | ICD-10-CM

## 2021-10-31 DIAGNOSIS — M545 Low back pain, unspecified: Secondary | ICD-10-CM

## 2021-11-07 ENCOUNTER — Ambulatory Visit
Admission: RE | Admit: 2021-11-07 | Discharge: 2021-11-07 | Disposition: A | Discharge status: Home / Self Care | Payer: Medicare Other | Source: Ambulatory Visit | Attending: Neurosurgery | Admitting: Neurosurgery

## 2021-11-07 ENCOUNTER — Other Ambulatory Visit: Payer: Self-pay | Admitting: Neurosurgery

## 2021-11-07 DIAGNOSIS — M545 Low back pain, unspecified: Secondary | ICD-10-CM | POA: Diagnosis present

## 2021-11-07 DIAGNOSIS — G8929 Other chronic pain: Secondary | ICD-10-CM | POA: Diagnosis present

## 2021-11-07 DIAGNOSIS — M5440 Lumbago with sciatica, unspecified side: Secondary | ICD-10-CM | POA: Insufficient documentation

## 2021-11-07 DIAGNOSIS — M546 Pain in thoracic spine: Secondary | ICD-10-CM | POA: Insufficient documentation

## 2021-11-07 IMAGING — CT CT L SPINE W/O CM
3 of 5 series · 12 of 33 positions shown, 13 images · non-contrast
Comparison: MRI of the thoracic spine [DATE]; MRI of the
lumbar spine [DATE].

CLINICAL DATA: Chronic thoracic back pain, unspecified back pain
laterality M54.6, [UX] ([UX]-CM). Chronic low back pain with
sciatica, sciatica laterality unspecified, unspecified back pain
laterality M54.40, [UX] ([UX]-CM).

EXAM:
CT THORACIC AND LUMBAR SPINE WITHOUT CONTRAST
TECHNIQUE: Multidetector CT imaging of the thoracic and lumbar spine was
performed without contrast. Multiplanar CT image reconstructions
were also generated.
RADIATION DOSE REDUCTION: This exam was performed according to the
departmental dose-optimization program which includes automated
exposure control, adjustment of the mA and/or kV according to
patient size and/or use of iterative reconstruction technique.

[Series 7: coronal bone · coronal · 0.31mm/px · 3 of 57 slices shown]
[im 12/57  bone]
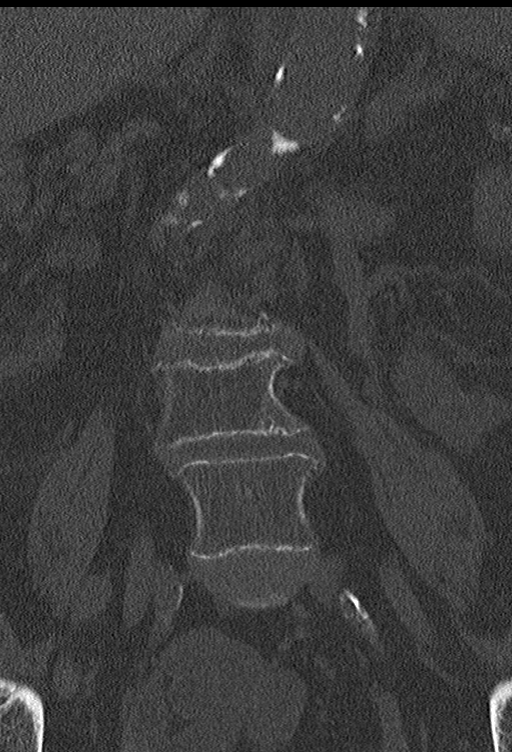
[im 23/57  bone]
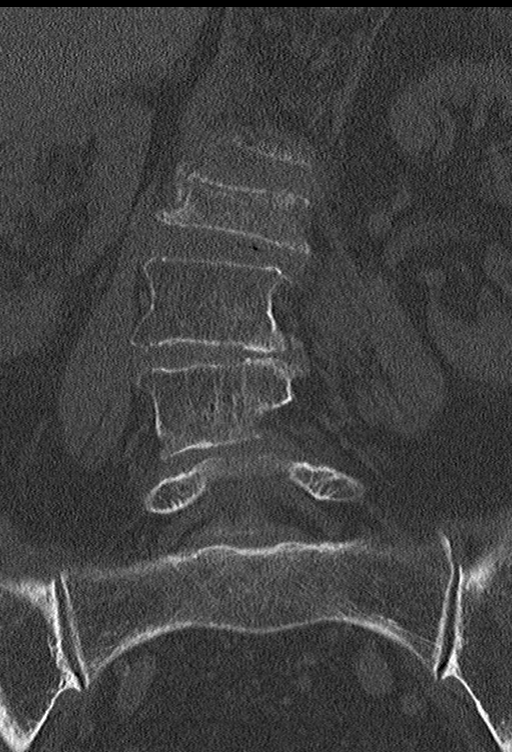
[im 34/57  bone]
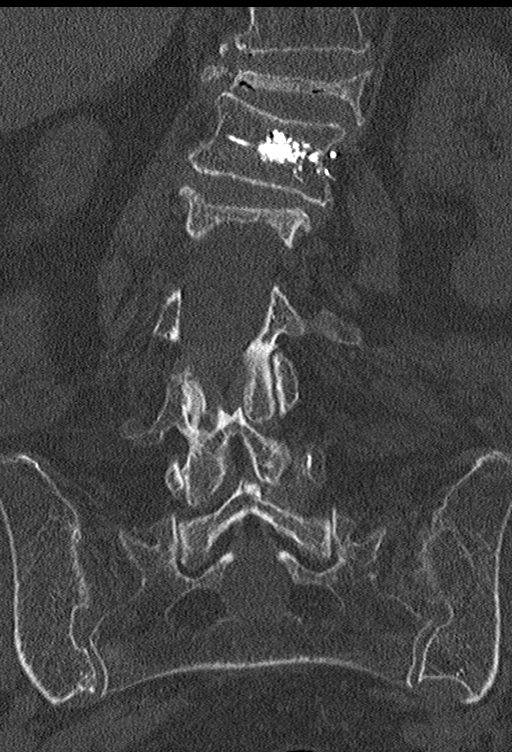

[Series 8: sagittal st · sagittal · 0.29mm/px · 5 of 81 slices shown]
[im 14/81  bone]
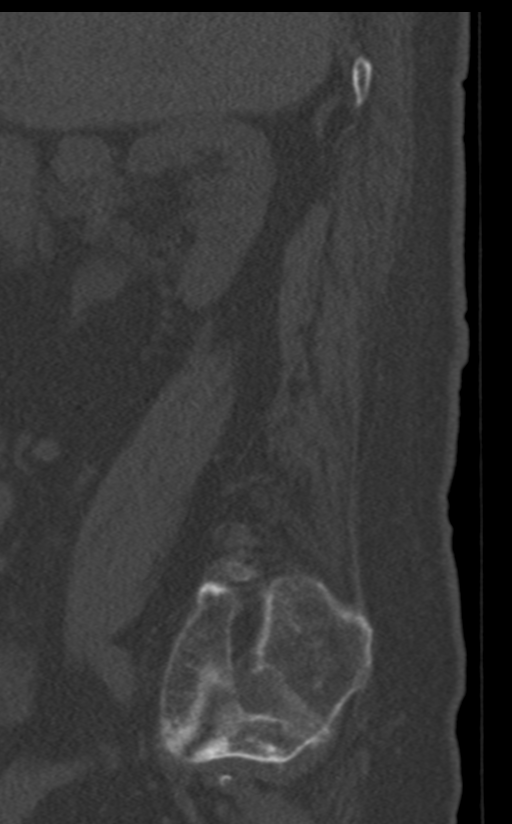
[im 27/81  bone]
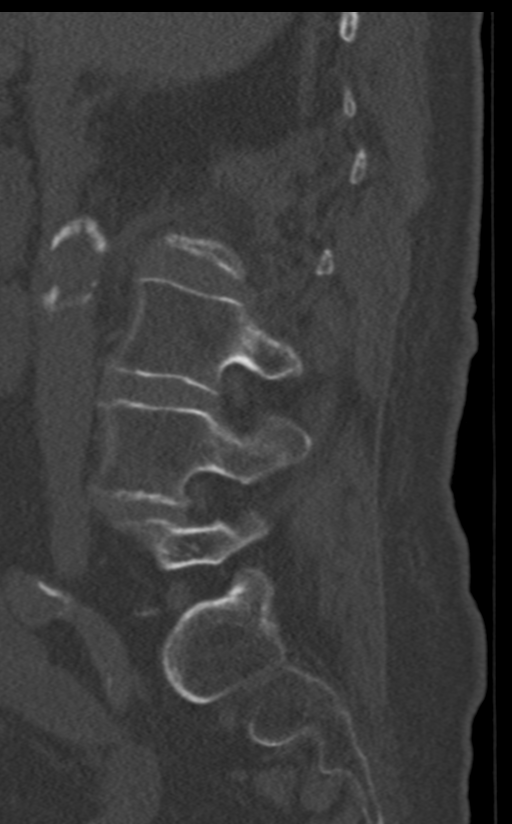
[im 41/81  bone]
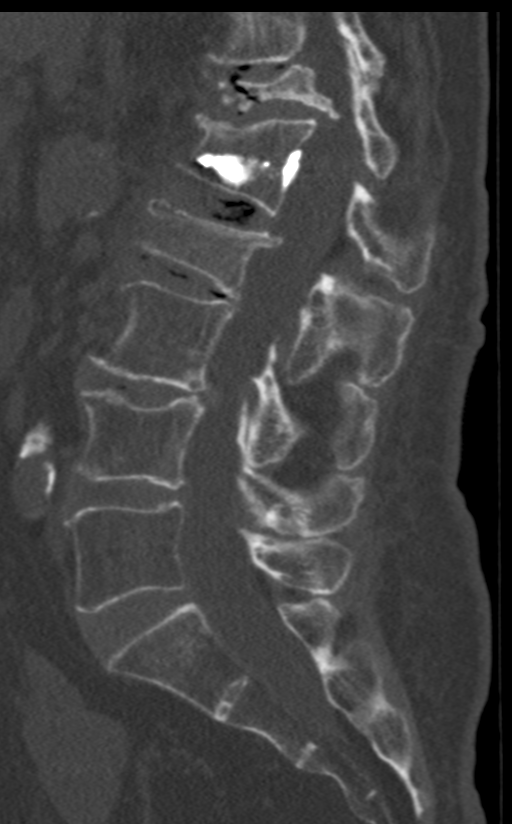
[im 54/81  bone]
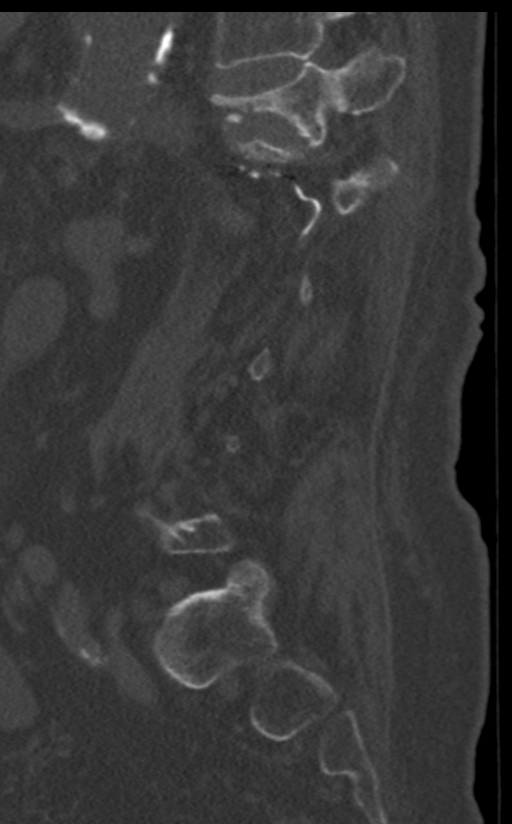
[im 67/81  bone]
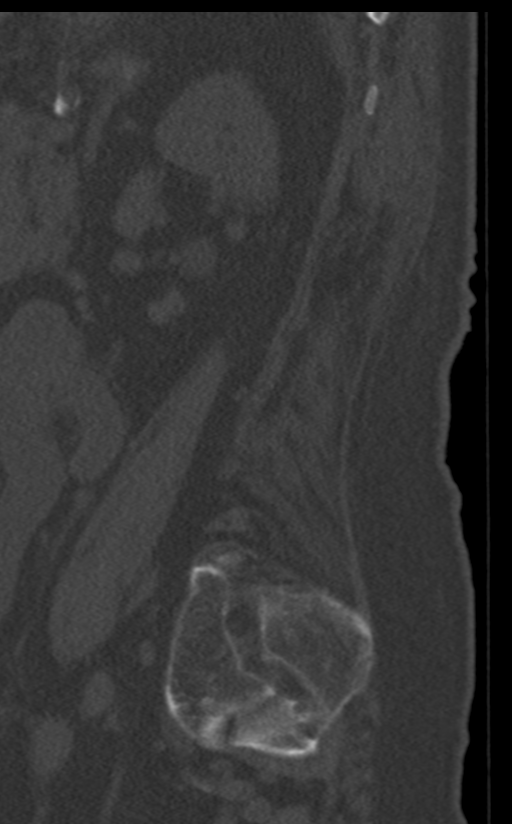

[Series 10: multi disc · axial · 0.21mm/px · z∈[-427,-308]mm · 4 of 92 slices shown, 5 images]
[im 19/92  soft-tissue]
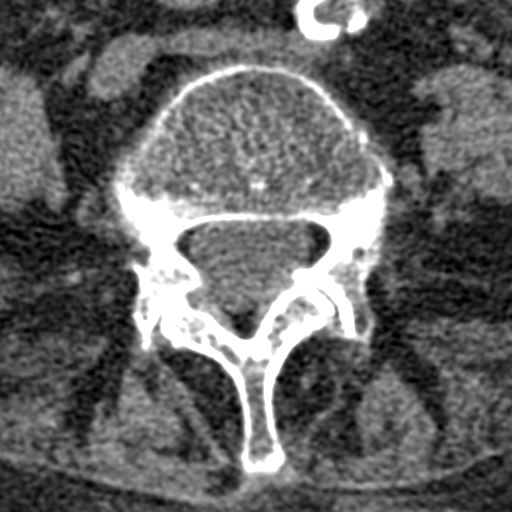
[im 19/92  bone]
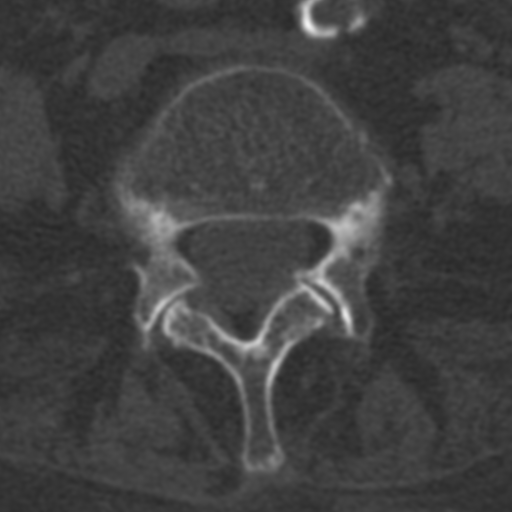
[im 37/92  bone]
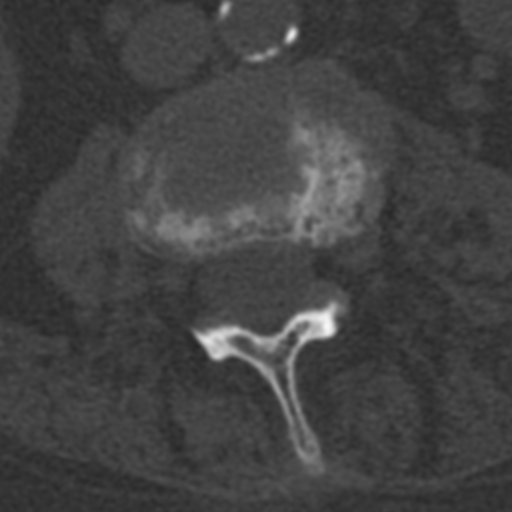
[im 55/92  bone]
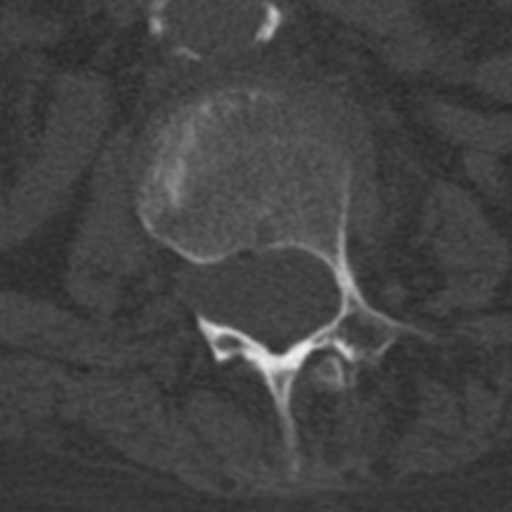
[im 73/92  bone]
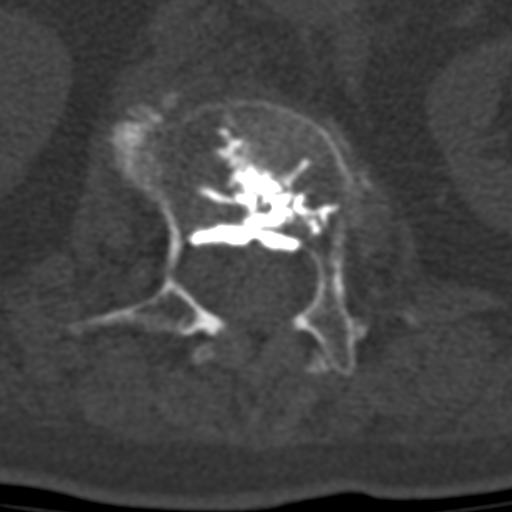

[12 of 33 positions shown; findings below may reference images not displayed]

FINDINGS: CT THORACIC SPINE FINDINGS

Alignment: S shaped scoliosis.

Vertebrae: No acute fracture or focal pathologic process. Chronic
compression fracture of the T8 vertebral body status post vertebral
augmentation. Chronic compression fracture of the T12 vertebral body
with approximately 80% height loss and retropulsion resulting in
moderate to severe spinal canal stenosis, similar to prior MRI of
the lumbar spine. Diffuse osteopenia.

Paraspinal and other soft tissues: Aortic calcified atherosclerotic
disease. Biapical pleural pulmonary scarring.

Disc levels: Facet degenerative change resulting mild bilateral
neural foraminal narrowing at T8-9 and on the right at T9-10.
Moderate right and mild left neural foraminal narrowing at T10-11.
Retropulsed bony fragment and facet osteoarthrosis results in
moderate to severe spinal canal stenosis, severe right neural
foraminal stenosis at T12-L1.

CT LUMBAR SPINE FINDINGS

Segmentation: 5 lumbar type vertebrae.

Alignment: Dextroconvex scoliosis.

Vertebrae: Interval progression of compression fracture of the L2
superior endplate now with approximately 55% height loss with a
progressed bony retropulsion in the superior aspect of the posterior
wall resulting in mild-to-moderate spinal canal stenosis at this
level. Chronic L1 compression fracture status post vertebral
augmentation with minimal amount of cement along the posterior wall
with no spinal canal stenosis. Stable mild chronic compression
fractures of the L4 and L5 superior endplates. Diffuse osteopenia.

Paraspinal and other soft tissues: Calcified aortic atherosclerotic
disease.

Disc levels: At L1-2, bony retropulsion results in mild-to-moderate
spinal canal stenosis. No high-grade spinal canal or neural
foraminal stenosis in the other levels.
IMPRESSION: CT THORACIC SPINE

No new vertebral body fracture identified.

Stable chronic compression fractures of T8 and T12, status post
vertebral augmentation at T8.

Retropulsion at the T12 level resulting moderate to severe spinal
canal stenosis.

CT LUMBAR SPINE

Interval progression of height loss at L[DATE] represent acute on
chronic fracture. There is approximately 55% height loss and bony
retropulsion resulting in mild-to-moderate spinal stenosis.

## 2021-11-20 ENCOUNTER — Telehealth: Payer: Self-pay

## 2021-11-20 NOTE — Telephone Encounter (Signed)
-----   Message from Peggyann Shoals sent at 11/20/2021 11:21 AM EDT ----- Regarding: pain med refill Contact: [phone] Oxycodone 5/'325mg'$  [facility] [address] Please call her when it has been sent

## 2021-11-21 ENCOUNTER — Other Ambulatory Visit: Payer: Self-pay | Admitting: Internal Medicine

## 2021-11-21 MED ORDER — OXYCODONE-ACETAMINOPHEN 5-325 MG PO TABS: 1.0000 | ORAL_TABLET | Freq: Three times a day (TID) | ORAL | 0 refills | Status: AC | PRN

## 2021-11-21 NOTE — Telephone Encounter (Signed)
Can you give her enough medication to last until her appt with Dr.Lateef on 12/26/2021.

## 2021-11-21 NOTE — Telephone Encounter (Signed)
Patient notified

## 2021-11-21 NOTE — Progress Notes (Signed)
Sent 14 days of Percocet in. Pt has appointment with Dr. Holley Raring on 8/10

## 2021-12-06 ENCOUNTER — Telehealth: Payer: Self-pay

## 2021-12-06 ENCOUNTER — Other Ambulatory Visit: Payer: Self-pay | Admitting: Neurosurgery

## 2021-12-06 MED ORDER — OXYCODONE-ACETAMINOPHEN 5-325 MG PO TABS: 1.0000 | ORAL_TABLET | Freq: Three times a day (TID) | ORAL | 0 refills | Status: DC | PRN

## 2021-12-06 NOTE — Telephone Encounter (Signed)
-----   Message from Peggyann Shoals sent at 12/06/2021  8:09 AM EDT ----- Regarding: pain med refill Contact: [phone] Oxycodone '5mg'$  every 8 hours [facility] [address] You told her she had to call weekly for the refill

## 2021-12-06 NOTE — Telephone Encounter (Signed)
Please let her know.

## 2021-12-06 NOTE — Telephone Encounter (Signed)
Patient has been notified

## 2021-12-09 NOTE — Telephone Encounter (Signed)
[facility] told her that the medication is on back order for them. Can you send it to [place].

## 2021-12-10 ENCOUNTER — Other Ambulatory Visit: Payer: Self-pay | Admitting: Neurosurgery

## 2021-12-10 MED ORDER — OXYCODONE-ACETAMINOPHEN 5-325 MG PO TABS
1.0000 | ORAL_TABLET | Freq: Three times a day (TID) | ORAL | 0 refills | Status: DC | PRN
Start: 2021-12-10 — End: 2021-12-26

## 2021-12-10 NOTE — Telephone Encounter (Signed)
Patient has been notified

## 2021-12-26 ENCOUNTER — Ambulatory Visit
Payer: Medicare Other | Attending: Student in an Organized Health Care Education/Training Program | Admitting: Student in an Organized Health Care Education/Training Program

## 2021-12-26 ENCOUNTER — Encounter: Payer: Self-pay | Admitting: Student in an Organized Health Care Education/Training Program

## 2021-12-26 ENCOUNTER — Other Ambulatory Visit: Payer: Self-pay | Admitting: Neurosurgery

## 2021-12-26 ENCOUNTER — Telehealth: Payer: Self-pay

## 2021-12-26 VITALS — BP 156/54 | HR 54 | Temp 97.0°F | Resp 16 | Ht 63.0 in | Wt 150.4 lb

## 2021-12-26 DIAGNOSIS — Z79899 Other long term (current) drug therapy: Secondary | ICD-10-CM | POA: Insufficient documentation

## 2021-12-26 DIAGNOSIS — S32020A Wedge compression fracture of second lumbar vertebra, initial encounter for closed fracture: Secondary | ICD-10-CM | POA: Insufficient documentation

## 2021-12-26 DIAGNOSIS — M4804 Spinal stenosis, thoracic region: Secondary | ICD-10-CM | POA: Diagnosis not present

## 2021-12-26 DIAGNOSIS — Z79891 Long term (current) use of opiate analgesic: Secondary | ICD-10-CM | POA: Diagnosis present

## 2021-12-26 DIAGNOSIS — Z9889 Other specified postprocedural states: Secondary | ICD-10-CM | POA: Diagnosis not present

## 2021-12-26 DIAGNOSIS — M5136 Other intervertebral disc degeneration, lumbar region: Secondary | ICD-10-CM | POA: Insufficient documentation

## 2021-12-26 DIAGNOSIS — G894 Chronic pain syndrome: Secondary | ICD-10-CM | POA: Insufficient documentation

## 2021-12-26 DIAGNOSIS — S22060G Wedge compression fracture of T7-T8 vertebra, subsequent encounter for fracture with delayed healing: Secondary | ICD-10-CM | POA: Diagnosis not present

## 2021-12-26 DIAGNOSIS — M48062 Spinal stenosis, lumbar region with neurogenic claudication: Secondary | ICD-10-CM | POA: Diagnosis not present

## 2021-12-26 DIAGNOSIS — S32020S Wedge compression fracture of second lumbar vertebra, sequela: Secondary | ICD-10-CM | POA: Diagnosis present

## 2021-12-26 DIAGNOSIS — S22000G Wedge compression fracture of unspecified thoracic vertebra, subsequent encounter for fracture with delayed healing: Secondary | ICD-10-CM | POA: Insufficient documentation

## 2021-12-26 MED ORDER — OXYCODONE-ACETAMINOPHEN 5-325 MG PO TABS
1.0000 | ORAL_TABLET | Freq: Three times a day (TID) | ORAL | 0 refills | Status: DC | PRN
Start: 2021-12-26 — End: 2022-01-09

## 2021-12-26 MED ORDER — METHOCARBAMOL 500 MG PO TABS: 500.0000 mg | ORAL_TABLET | Freq: Two times a day (BID) | ORAL | 0 refills | Status: DC | PRN

## 2021-12-26 NOTE — Progress Notes (Signed)
Patient: Laura Cole  Service Category: E/M  Provider: Gillis Santa, MD  DOB: December 02, 1940  DOS: 12/26/2021  Referring Provider: Loleta Dicker, PA  MRN: [id number]  Setting: Ambulatory outpatient  PCP: Baxter Hire, MD  Type: New Patient  Specialty: Interventional Pain Management    Location: Office  Delivery: Face-to-face     Primary Reason(s) for Visit: Encounter for initial evaluation of one or more chronic problems (new to examiner) potentially causing chronic pain, and posing a threat to normal musculoskeletal function. (Level of risk: High) CC: Abdominal Pain and Back Pain  HPI  Laura Cole is a 81 y.o. year old, female patient, who comes for the first time to our practice referred by Loleta Dicker, PA for our initial evaluation of her chronic pain. She has Gastroenteritis due to COVID-19 virus; Diarrhea due to COVID-19; Hypokalemia; Hyponatremia; Spinal stenosis, lumbar region, with neurogenic claudication; Chronic thoracic back pain; Closed fracture of dorsal (thoracic) vertebra ([place]); Compression fracture of thoracic vertebra with delayed healing; History of kyphoplasty; Spinal stenosis, thoracic; Compression fracture of L2 lumbar vertebra ([place]); Lumbar degenerative disc disease; Encounter for long-term opiate analgesic use; and Chronic pain syndrome on their problem list. Today she comes in for evaluation of her Abdominal Pain and Back Pain  Pain Assessment: Location:   Abdomen Radiating: radiates around waist area Onset: More than a month ago Duration: Chronic pain Quality: Sharp (stabbing when standing) Severity: 6 /10 (subjective, self-reported pain score)  Effect on ADL: Limits activities Timing: Constant Modifying factors: med, BP: (!) 156/54  HR: (!) 54  Onset and Duration: Present longer than 3 months Cause of pain: Unknown Severity: Getting worse, NAS-11 at its worse: 9/10, NAS-11 at its best: 3/10, NAS-11 now: 7/10, and NAS-11 on the average: 7/10 Timing: Not  influenced by the time of the day Aggravating Factors: Bending, Bowel movements, Lifiting, Prolonged sitting, Prolonged standing, Surgery made it worse, and Twisting Alleviating Factors: Medications, Sitting, Sleeping, and Walking Associated Problems: Color changes, Constipation, Night-time cramps, Fatigue, Inability to control bladder (urine), Inability to control bowel, Nausea, Numbness, Swelling, and Tingling Quality of Pain: Burning, Deep, Disabling, Feeling of constriction, Feeling of weight, Heavy, Nagging, Pressure-like, Pulsating, Sharp, Shooting, Stabbing, Throbbing, and Tingling Previous Examinations or Tests: Bone scan, CT scan, Endoscopy, MRI scan, X-rays, Neurological evaluation, Neurosurgical evaluation, and Orthopedic evaluation Previous Treatments: Epidural steroid injections and Narcotic medications  Laura Cole is a pleasant 81 year old female with a history of chronic thoracic pain related to thoracic specifically T8, T12 compression fractures resulting in significant thoracic kyphosis and thoracic spinal stenosis which is severe in her T12 region.  She also has a history of L2 lumbar compression fracture.  She describes mid back pain as well as abdominal pain given her intercostal discomfort in her right lower quadrant given her compression fractures.  She wants to avoid surgery.  She has had injections in the past which aggravated her pain.  She is very knowledgeable about her pain condition and what has helped in the past.  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  She states that she was his primary caregiver for 8 years after the double lung transplant.  She is currently on baclofen 10 mg twice a day.  She states that when she takes that with hydrocodone, she experiences hallucinations.  She also takes oxycodone 5 mg 3 times a day as needed which does provide her with analgesic and functional benefit.  She ambulates with a walker.  She does have  appropriate gait, speed, balance while she is walking.  She is cognitively intact and sharp.  No side effects noted with current opioid regimen.  Review of prior records demonstrates compliance with prior regimen.   Historic Controlled Substance Pharmacotherapy Review  PMP and historical list of controlled substances: Percocet 5 mg 3 times daily as needed MME equals 22.5 Historical Monitoring: The patient  reports no history of drug use. List of prior UDS Testing: No results found for: "MDMA", "COCAINSCRNUR", "PCPSCRNUR", "PCPQUANT", "CANNABQUANT", "THCU", "ETH", "CBDTHCR", "D8THCCBX", "D9THCCBX" Historical Background Evaluation: [place] PMP: PDMP reviewed during this encounter. Review of the past 25-month conducted.              [place] Department of public safety, offender search: (Editor, commissioningInformation) Non-contributory Risk Assessment Profile: Aberrant behavior: None observed or detected today Risk factors for fatal opioid overdose: None identified today Fatal overdose hazard ratio (HR): Calculation deferred Non-fatal overdose hazard ratio (HR): Calculation deferred Risk of opioid abuse or dependence: 0.7-3.0% with doses ? 36 MME/day and 6.1-26% with doses ? 120 MME/day. Substance use disorder (SUD) risk level: See below Personal History of Substance Abuse (SUD-Substance use disorder):  Alcohol: Negative  Illegal Drugs: Negative  Rx Drugs: Negative  ORT Risk Level calculation: Low Risk  Opioid Risk Tool - 12/26/21 1249       Family History of Substance Abuse   Alcohol Negative    Illegal Drugs Negative    Rx Drugs Negative      Personal History of Substance Abuse   Alcohol Negative    Illegal Drugs Negative    Rx Drugs Negative      Age   Age between 122-45years  No      History of Preadolescent Sexual Abuse   History of Preadolescent Sexual Abuse Negative or Female      Psychological Disease   Psychological Disease Negative    Depression Negative      Total Score   Opioid Risk  Tool Scoring 0    Opioid Risk Interpretation Low Risk            ORT Scoring interpretation table:  Score <3 = Low Risk for SUD  Score between 4-7 = Moderate Risk for SUD  Score >8 = High Risk for Opioid Abuse   PHQ-2 Depression Scale:  Total score: 0  PHQ-2 Scoring interpretation table: (Score and probability of major depressive disorder)  Score 0 = No depression  Score 1 = 15.4% Probability  Score 2 = 21.1% Probability  Score 3 = 38.4% Probability  Score 4 = 45.5% Probability  Score 5 = 56.4% Probability  Score 6 = 78.6% Probability   PHQ-9 Depression Scale:  Total score: 0  PHQ-9 Scoring interpretation table:  Score 0-4 = No depression  Score 5-9 = Mild depression  Score 10-14 = Moderate depression  Score 15-19 = Moderately severe depression  Score 20-27 = Severe depression (2.4 times higher risk of SUD and 2.89 times higher risk of overuse)   Pharmacologic Plan: As per protocol, I have not taken over any controlled substance management, pending the results of ordered tests and/or consults.            Initial impression: Pending review of available data and ordered tests.  Meds   Current Outpatient Medications:    alendronate (FOSAMAX) 70 MG tablet, Take 70 mg by mouth once a week., Disp: , Rfl:    amLODipine (NORVASC) 2.5 MG tablet, Take 2.5 mg by mouth in  the morning and at bedtime., Disp: , Rfl:    baclofen (LIORESAL) 10 MG tablet, Take 10 mg by mouth 3 (three) times daily as needed for muscle spasms., Disp: , Rfl:    ipratropium (ATROVENT) 0.06 % nasal spray, Place into the nose., Disp: , Rfl:    levothyroxine (SYNTHROID) 50 MCG tablet, Take 50 mcg by mouth daily before breakfast., Disp: , Rfl:    methocarbamol (ROBAXIN) 500 MG tablet, Take 1 tablet (500 mg total) by mouth every 12 (twelve) hours as needed for muscle spasms., Disp: 60 tablet, Rfl: 0   metoprolol tartrate (LOPRESSOR) 25 MG tablet, Take 12.5 mg by mouth 2 (two) times daily., Disp: , Rfl:     ondansetron (ZOFRAN-ODT) 4 MG disintegrating tablet, Take 4 mg by mouth every 8 (eight) hours as needed for nausea/vomiting., Disp: , Rfl:    oxyCODONE-acetaminophen (PERCOCET) 5-325 MG tablet, Take 1 tablet by mouth 3 (three) times daily as needed for up to 17 days for severe pain., Disp: 51 tablet, Rfl: 0   pantoprazole (PROTONIX) 20 MG tablet, Take 20 mg by mouth 2 (two) times daily., Disp: , Rfl:   Imaging Review  MR THORACIC SPINE WO CONTRAST  Narrative CLINICAL DATA:  Bending injury on 05/07/2021.  Compression fracture.  EXAM: MRI THORACIC SPINE WITHOUT CONTRAST  TECHNIQUE: Multiplanar, multisequence MR imaging of the thoracic spine was performed. No intravenous contrast was administered.  COMPARISON:  Radiography 05/07/2021.  FINDINGS: Alignment: Scoliotic curvature convex to the right with the apex at T6.  Vertebrae: Old minor superior endplate depression at T4. Old healed augmented fracture at T8 with loss of height of 30%. Progression of the previously seen acute fracture at T12 with loss of height of 70% presently and retropulsion and or disc extrusion extending 5 mm posteriorly, encroaching upon the spinal canal, with effacement of the subarachnoid space and slight indentation of the cord. Superior endplate fracture anteriorly at L1 with loss of height 20% anteriorly but no posterior vertebral body involvement.  Cord: No primary cord lesion. See above for description of the T12 level.  Paraspinal and other soft tissues: Aortic atherosclerosis. Otherwise negative.  Disc levels:  No significant disc level pathology from T1-2 through T11-12. Minimal non-compressive disc bulges.  As noted above, at T12 and T12-L1, there is 5 mm retropulsion of the inferior endplate and probably some associated disc material which encroaches upon the spinal canal, effacing the ventral subarachnoid space and indenting the ventral cord slightly.  IMPRESSION: Progression of the  previously seen compression fracture at T12, now with loss of height of 70%. Inferior endplate shows retropulsion and possibly associated herniation of the T12-L1 disc by a distance of 5 mm, encroaching upon the spinal canal, effacing the ventral subarachnoid space and indenting the ventral cord slightly.  New superior endplate fracture anteriorly at L1 with loss of height anteriorly of 20%. No involvement of the posterior aspect of the vertebral body.   Electronically Signed By: Nelson Chimes M.D. On: 07/23/2021 16:43    Narrative CLINICAL DATA:  Chronic thoracic back pain, unspecified back pain laterality M54.6, G89.29 (ICD-10-CM). Chronic low back pain with sciatica, sciatica laterality unspecified, unspecified back pain laterality M54.40, G89.29 (ICD-10-CM).  EXAM: CT THORACIC AND LUMBAR SPINE WITHOUT CONTRAST  TECHNIQUE: Multidetector CT imaging of the thoracic and lumbar spine was performed without contrast. Multiplanar CT image reconstructions were also generated.  RADIATION DOSE REDUCTION: This exam was performed according to the departmental dose-optimization program which includes automated exposure control, adjustment of the  mA and/or kV according to patient size and/or use of iterative reconstruction technique.  COMPARISON:  MRI of the thoracic spine July 23, 2021; MRI of the lumbar spine Oct 16, 2021.  FINDINGS: CT THORACIC SPINE FINDINGS  Alignment: S shaped scoliosis.  Vertebrae: No acute fracture or focal pathologic process. Chronic compression fracture of the T8 vertebral body status post vertebral augmentation. Chronic compression fracture of the T12 vertebral body with approximately 80% height loss and retropulsion resulting in moderate to severe spinal canal stenosis, similar to prior MRI of the lumbar spine. Diffuse osteopenia.  Paraspinal and other soft tissues: Aortic calcified atherosclerotic disease. Biapical pleural pulmonary  scarring.  Disc levels: Facet degenerative change resulting mild bilateral neural foraminal narrowing at T8-9 and on the right at T9-10. Moderate right and mild left neural foraminal narrowing at T10-11. Retropulsed bony fragment and facet osteoarthrosis results in moderate to severe spinal canal stenosis, severe right neural foraminal stenosis at T12-L1.  CT LUMBAR SPINE FINDINGS  Segmentation: 5 lumbar type vertebrae.  Alignment: Dextroconvex scoliosis.  Vertebrae: Interval progression of compression fracture of the L2 superior endplate now with approximately 55% height loss with a progressed bony retropulsion in the superior aspect of the posterior wall resulting in mild-to-moderate spinal canal stenosis at this level. Chronic L1 compression fracture status post vertebral augmentation with minimal amount of cement along the posterior wall with no spinal canal stenosis. Stable mild chronic compression fractures of the L4 and L5 superior endplates. Diffuse osteopenia.  Paraspinal and other soft tissues: Calcified aortic atherosclerotic disease.  Disc levels: At L1-2, bony retropulsion results in mild-to-moderate spinal canal stenosis. No high-grade spinal canal or neural foraminal stenosis in the other levels.  IMPRESSION: CT THORACIC SPINE  No new vertebral body fracture identified.  Stable chronic compression fractures of T8 and T12, status post vertebral augmentation at T8.  Retropulsion at the T12 level resulting moderate to severe spinal canal stenosis.  CT LUMBAR SPINE  Interval progression of height loss at L2, may represent acute on chronic fracture. There is approximately 55% height loss and bony retropulsion resulting in mild-to-moderate spinal stenosis.   Electronically Signed By: Pedro Earls M.D. On: 11/08/2021 08:55 DG Thoracic Spine 2 View  Narrative CLINICAL DATA:  Mid to low back pain extending into both hips since yesterday.  No acute injury.  EXAM: THORACIC SPINE 2 VIEWS  COMPARISON:  Chest radiographs 04/23/2020, abdominal CT 11/23/2019 and chest CT 04/20/1990.  FINDINGS: There are small ribs at T12. There is a biconvex thoracolumbar scoliosis, convex to the left at T11. Patient is status post spinal augmentation at T8. There is a new inferior endplate compression fracture at T12 which is likely acute. This results in approximately 20% loss of vertebral body height. No osseous retropulsion identified. No other acute fractures are identified. Mild lower cervical spondylosis noted.  IMPRESSION: New inferior endplate compression fracture at T12, likely acute. Previous T8 spinal augmentation.   Electronically Signed By: Richardean Sale M.D. On: 05/07/2021 13:59  DG Thoracic Spine W/Swimmers  Narrative CLINICAL DATA:  Acute low back pain, unspecified back pain laterality, unspecified whether sciatica present  EXAM: THORACIC SPINE - 3 VIEWS; LUMBAR SPINE - 2-3 VIEW  COMPARISON:  None Available.  FINDINGS: Thoracic spine:  Sigmoid scoliosis of the thoracolumbar spine. Previous vertebroplasty at T8. Chronic compression deformity of T12 with similar severe height loss. No new definite compression deformity of the thoracic spine. Osteopenia. Visualized soft tissues are unremarkable.  Lumbar spine:  Sigmoid scoliosis of the  thoracolumbar spine. Previous vertebroplasty at L1. Questionable irregularity of the L2 superior endplate. No new definite compression deformity. Osteopenia. Atherosclerosis of the abdominal aorta. Surgical clips in the right upper quadrant.  IMPRESSION: Thoracic spine, lumbar spine:  1. No new definite acute compression deformity. There is questionable and subtle irregularity of the L2 superior endplate. If there is persistent clinical concern for acute fracture, consider short-term follow-up imaging or MRI as appropriate. 2. Sigmoid scoliosis of the thoracolumbar  spine with stable findings of T8 and L1 vertebroplasty, and chronic severe compression deformity of the T12 vertebral body.   Electronically Signed By: Albin Felling M.D.   MR LUMBAR SPINE WO CONTRAST  Addendum 10/17/2021  9:40 AM ADDENDUM REPORT: 10/17/2021 09:38  ADDENDUM: Axial T1 weighted images are now available and no additional finding is noted.   Electronically Signed By: Jorje Guild M.D. On: 10/17/2021 09:38  Narrative CLINICAL DATA:  History of compression fracture. History of kyphoplasty in March  EXAM: MRI LUMBAR SPINE WITHOUT CONTRAST  TECHNIQUE: Multiplanar, multisequence MR imaging of the lumbar spine was performed. No intravenous contrast was administered.  COMPARISON:  09/04/2021  FINDINGS: Segmentation:  5 lumbar type vertebrae  Alignment:  Dextroscoliosis.  Mild T12-L1 anterolisthesis.  Vertebrae: Horizontal fracture plane to the L2 body with mild superior endplate depression. The fracture cleft contains fluid. Marrow edema mildly extends into the right pedicle.  Remote T12 compression fracture with cement augmentation and healed posteroinferior corner retropulsion. No evidence of aggressive bone lesion.  Conus medullaris and cauda equina: Conus extends to the L2 level. Conus and cauda equina appear normal.  Paraspinal and other soft tissues: Diffuse fatty atrophy of intrinsic back muscles  Disc levels:  T12- L1: Posttraumatic deformity of the T12 body with retropulsed bone impinging on the right foramen. Bone also contacts the ventral cord without compression  L1-L2: Mild disc bulging.  L2-L3: Disc narrowing and bulging with small right foraminal protrusion. No neural compression  L3-L4: Disc narrowing and bulging. Negative facets. No neural compression.  L4-L5: Disc narrowing and bulging. Negative facets. No neural compression  L5-S1:Unremarkable.  No axial T1 weighted imaging. Interrogation has been made and  these will be reviewed if they were obtained by the technologist.  IMPRESSION: 1. Acute L2 compression fracture with mild height loss. 2. Remote T12 compression fracture with healed retropulsion causing advanced right foraminal impingement and mild crowding of the cord. 3. Lumbar spine degeneration with mild scoliosis. No degenerative impingement.  Electronically Signed: By: Jorje Guild M.D. On: 10/17/2021 09:07    Narrative CLINICAL DATA:  Chronic thoracic back pain, unspecified back pain laterality M54.6, G89.29 (ICD-10-CM). Chronic low back pain with sciatica, sciatica laterality unspecified, unspecified back pain laterality M54.40, G89.29 (ICD-10-CM).  EXAM: CT THORACIC AND LUMBAR SPINE WITHOUT CONTRAST  TECHNIQUE: Multidetector CT imaging of the thoracic and lumbar spine was performed without contrast. Multiplanar CT image reconstructions were also generated.  RADIATION DOSE REDUCTION: This exam was performed according to the departmental dose-optimization program which includes automated exposure control, adjustment of the mA and/or kV according to patient size and/or use of iterative reconstruction technique.  COMPARISON:  MRI of the thoracic spine July 23, 2021; MRI of the lumbar spine Oct 16, 2021.  FINDINGS: CT THORACIC SPINE FINDINGS  Alignment: S shaped scoliosis.  Vertebrae: No acute fracture or focal pathologic process. Chronic compression fracture of the T8 vertebral body status post vertebral augmentation. Chronic compression fracture of the T12 vertebral body with approximately 80% height loss and retropulsion resulting in  moderate to severe spinal canal stenosis, similar to prior MRI of the lumbar spine. Diffuse osteopenia.  Paraspinal and other soft tissues: Aortic calcified atherosclerotic disease. Biapical pleural pulmonary scarring.  Disc levels: Facet degenerative change resulting mild bilateral neural foraminal narrowing at T8-9 and on  the right at T9-10. Moderate right and mild left neural foraminal narrowing at T10-11. Retropulsed bony fragment and facet osteoarthrosis results in moderate to severe spinal canal stenosis, severe right neural foraminal stenosis at T12-L1.  CT LUMBAR SPINE FINDINGS  Segmentation: 5 lumbar type vertebrae.  Alignment: Dextroconvex scoliosis.  Vertebrae: Interval progression of compression fracture of the L2 superior endplate now with approximately 55% height loss with a progressed bony retropulsion in the superior aspect of the posterior wall resulting in mild-to-moderate spinal canal stenosis at this level. Chronic L1 compression fracture status post vertebral augmentation with minimal amount of cement along the posterior wall with no spinal canal stenosis. Stable mild chronic compression fractures of the L4 and L5 superior endplates. Diffuse osteopenia.  Paraspinal and other soft tissues: Calcified aortic atherosclerotic disease.  Disc levels: At L1-2, bony retropulsion results in mild-to-moderate spinal canal stenosis. No high-grade spinal canal or neural foraminal stenosis in the other levels.  IMPRESSION: CT THORACIC SPINE  No new vertebral body fracture identified.  Stable chronic compression fractures of T8 and T12, status post vertebral augmentation at T8.  Retropulsion at the T12 level resulting moderate to severe spinal canal stenosis.  CT LUMBAR SPINE  Interval progression of height loss at L2, may represent acute on chronic fracture. There is approximately 55% height loss and bony retropulsion resulting in mild-to-moderate spinal stenosis.   Electronically Signed By: Pedro Earls M.D. On: 11/08/2021 08:55   Narrative CLINICAL DATA:  Acute low back pain, unspecified back pain laterality, unspecified whether sciatica present  EXAM: THORACIC SPINE - 3 VIEWS; LUMBAR SPINE - 2-3 VIEW  COMPARISON:  None Available.  FINDINGS: Thoracic  spine:  Sigmoid scoliosis of the thoracolumbar spine. Previous vertebroplasty at T8. Chronic compression deformity of T12 with similar severe height loss. No new definite compression deformity of the thoracic spine. Osteopenia. Visualized soft tissues are unremarkable.  Lumbar spine:  Sigmoid scoliosis of the thoracolumbar spine. Previous vertebroplasty at L1. Questionable irregularity of the L2 superior endplate. No new definite compression deformity. Osteopenia. Atherosclerosis of the abdominal aorta. Surgical clips in the right upper quadrant.  IMPRESSION: Thoracic spine, lumbar spine:  1. No new definite acute compression deformity. There is questionable and subtle irregularity of the L2 superior endplate. If there is persistent clinical concern for acute fracture, consider short-term follow-up imaging or MRI as appropriate. 2. Sigmoid scoliosis of the thoracolumbar spine with stable findings of T8 and L1 vertebroplasty, and chronic severe compression deformity of the T12 vertebral body.   Electronically Signed By: Albin Felling M.D. On: 10/08/2021 12:27   Complexity Note: Imaging results reviewed.                         ROS  Cardiovascular: Heart trouble, High blood pressure, Chest pain, and Needs antibiotics prior to dental procedures Pulmonary or Respiratory: Lung problems and Coughing up mucus (Bronchitis) Neurological: Stroke (Residual deficits or weakness: urinary), Curved spine, and Incontinence:  Urinary (chronic) Psychological-Psychiatric: Difficulty sleeping and or falling asleep Gastrointestinal: Vomiting blood (Ulcers), Reflux or heatburn, and Alternating episodes iof diarrhea and constipation (IBS-Irritable bowe syndrome) Genitourinary: Peeing blood and Recurrent Urinary Tract infections Hematological: Brusing easily Endocrine: High thyroid Rheumatologic: Joint  aches and or swelling due to excess weight (Osteoarthritis) Musculoskeletal: Negative for  myasthenia gravis, muscular dystrophy, multiple sclerosis or malignant hyperthermia Work History: Retired  Allergies  Ms. Dyke is allergic to mushroom extract complex, promethazine, alprazolam, cefdinir, ciprofloxacin, dilaudid [hydromorphone hcl], hydromorphone, phenergan [promethazine hcl], pravastatin, and augmentin [amoxicillin-pot clavulanate].  Laboratory Chemistry Profile   Renal Lab Results  Component Value Date   BUN 14 08/12/2021   CREATININE 0.91 08/12/2021   GFRAA >60 04/20/2018   GFRNONAA >60 08/12/2021   SPECGRAV 1.025 01/11/2019   PHUR 5.5 01/11/2019   PROTEINUR NEGATIVE 07/09/2021     Electrolytes Lab Results  Component Value Date   NA 140 08/12/2021   K 3.7 08/12/2021   CL 106 08/12/2021   CALCIUM 10.3 08/12/2021   MG 2.1 05/26/2021     Hepatic Lab Results  Component Value Date   AST 22 05/25/2021   ALT 15 05/25/2021   ALBUMIN 3.7 05/25/2021   ALKPHOS 72 05/25/2021   LIPASE 68 (H) 04/20/2018     ID Lab Results  Component Value Date   SARSCOV2NAA NEGATIVE 12/01/2019     Bone No results found for: "VD25OH", "VD125OH2TOT", "YB6389HT3", "SK8768TL5", "25OHVITD1", "25OHVITD2", "72IOMBTD9", "TESTOFREE", "TESTOSTERONE"   Endocrine Lab Results  Component Value Date   GLUCOSE 106 (H) 08/12/2021   GLUCOSEU NEGATIVE 07/09/2021   TSH 3.319 04/23/2020     Neuropathy No results found for: "VITAMINB12", "FOLATE", "HGBA1C", "HIV"   CNS No results found for: "COLORCSF", "APPEARCSF", "RBCCOUNTCSF", "WBCCSF", "POLYSCSF", "LYMPHSCSF", "EOSCSF", "PROTEINCSF", "GLUCCSF", "JCVIRUS", "CSFOLI", "IGGCSF", "LABACHR", "ACETBL"   Inflammation (CRP: Acute  ESR: Chronic) No results found for: "CRP", "ESRSEDRATE", "LATICACIDVEN"   Rheumatology No results found for: "RF", "ANA", "LABURIC", "URICUR", "LYMEIGGIGMAB", "LYMEABIGMQN", "HLAB27"   Coagulation Lab Results  Component Value Date   INR 1.0 08/12/2021   LABPROT 12.9 08/12/2021   PLT 296 08/12/2021      Cardiovascular Lab Results  Component Value Date   BNP 103.9 (H) 04/23/2020   TROPONINI <0.03 04/20/2018   HGB 13.0 08/12/2021   HCT 40.9 08/12/2021     Screening Lab Results  Component Value Date   SARSCOV2NAA NEGATIVE 12/01/2019     Cancer No results found for: "CEA", "CA125", "LABCA2"   Allergens No results found for: "ALMOND", "APPLE", "ASPARAGUS", "AVOCADO", "BANANA", "BARLEY", "BASIL", "BAYLEAF", "GREENBEAN", "LIMABEAN", "WHITEBEAN", "BEEFIGE", "REDBEET", "BLUEBERRY", "BROCCOLI", "CABBAGE", "MELON", "CARROT", "CASEIN", "CASHEWNUT", "CAULIFLOWER", "CELERY"     Note: Lab results reviewed.  PFSH  Drug: Ms. Gonsalves  reports no history of drug use. Alcohol:  reports that she does not currently use alcohol. Tobacco:  reports that she has never smoked. She has never used smokeless tobacco. Medical:  has a past medical history of Bradycardia, Cancer ([place]), Coronary artery disease, Diverticulitis, GERD (gastroesophageal reflux disease), Heart murmur, Hemorrhoids, Hypertension, Hypothyroidism, Osteoarthritis, Peripheral vascular disease ([place]), and TIA (transient ischemic attack). Family: family history includes Breast cancer (age of onset: 10) in her sister.  Past Surgical History:  Procedure Laterality Date   ABDOMINAL HYSTERECTOMY     BACK SURGERY     CHOLECYSTECTOMY     COLON SURGERY     COLONOSCOPY WITH PROPOFOL N/A 12/05/2019   Procedure: COLONOSCOPY WITH PROPOFOL;  Surgeon: Lesly Rubenstein, MD;  Location: [facility] ENDOSCOPY;  Service: Endoscopy;  Laterality: N/A;   ESOPHAGOGASTRODUODENOSCOPY (EGD) WITH PROPOFOL N/A 12/05/2019   Procedure: ESOPHAGOGASTRODUODENOSCOPY (EGD) WITH PROPOFOL;  Surgeon: Lesly Rubenstein, MD;  Location: [facility] ENDOSCOPY;  Service: Endoscopy;  Laterality: N/A;   EYE SURGERY  IR KYPHO LUMBAR INC FX REDUCE BONE BX UNI/BIL CANNULATION INC/IMAGING  08/12/2021   IR RADIOLOGIST EVAL & MGMT  07/30/2021   IR RADIOLOGIST EVAL & MGMT  09/05/2021   IR  RADIOLOGIST EVAL & MGMT  09/24/2021   RECTOCELE REPAIR     RECTOCELE REPAIR     REPLACEMENT TOTAL KNEE Bilateral    TKRX2 Bilateral    WRIST SURGERY     Active Ambulatory Problems    Diagnosis Date Noted   Gastroenteritis due to COVID-19 virus 05/26/2021   Diarrhea due to COVID-19 05/26/2021   Hypokalemia 05/26/2021   Hyponatremia 05/26/2021   Spinal stenosis, lumbar region, with neurogenic claudication 06/12/2020   Chronic thoracic back pain 09/05/2014   Closed fracture of dorsal (thoracic) vertebra ([place]) 04/24/2020   Compression fracture of thoracic vertebra with delayed healing 12/26/2021   History of kyphoplasty 12/26/2021   Spinal stenosis, thoracic 12/26/2021   Compression fracture of L2 lumbar vertebra ([place]) 12/26/2021   Lumbar degenerative disc disease 12/26/2021   Encounter for long-term opiate analgesic use 12/26/2021   Chronic pain syndrome 12/26/2021   Resolved Ambulatory Problems    Diagnosis Date Noted   No Resolved Ambulatory Problems   Past Medical History:  Diagnosis Date   Bradycardia    Cancer ([facility])    Coronary artery disease    Diverticulitis    GERD (gastroesophageal reflux disease)    Heart murmur    Hemorrhoids    Hypertension    Hypothyroidism    Osteoarthritis    Peripheral vascular disease ([place])    TIA (transient ischemic attack)    Constitutional Exam  General appearance: Well nourished, well developed, and well hydrated. In no apparent acute distress Vitals:   12/26/21 1238  BP: (!) 156/54  Pulse: (!) 54  Resp: 16  Temp: (!) 97 F (36.1 C)  SpO2: 100%  Weight: 150 lb 6.4 oz (68.2 kg)  Height: 5' 3"  (1.6 m)   BMI Assessment: Estimated body mass index is 26.64 kg/m as calculated from the following:   Height as of this encounter: 5' 3"  (1.6 m).   Weight as of this encounter: 150 lb 6.4 oz (68.2 kg).  BMI interpretation table: BMI level Category Range association with higher incidence of chronic pain  <18 kg/m2 Underweight    18.5-24.9 kg/m2 Ideal body weight   25-29.9 kg/m2 Overweight Increased incidence by 20%  30-34.9 kg/m2 Obese (Class I) Increased incidence by 68%  35-39.9 kg/m2 Severe obesity (Class II) Increased incidence by 136%  >40 kg/m2 Extreme obesity (Class III) Increased incidence by 254%   Patient's current BMI Ideal Body weight  Body mass index is 26.64 kg/m. Ideal body weight: 52.4 kg (115 lb 8.3 oz) Adjusted ideal body weight: 58.7 kg (129 lb 7.6 oz)   BMI Readings from Last 4 Encounters:  12/26/21 26.64 kg/m  08/12/21 24.96 kg/m  07/09/21 26.19 kg/m  05/25/21 26.15 kg/m   Wt Readings from Last 4 Encounters:  12/26/21 150 lb 6.4 oz (68.2 kg)  08/12/21 150 lb (68 kg)  07/09/21 162 lb 4.1 oz (73.6 kg)  05/25/21 162 lb (73.5 kg)    Psych/Mental status: Alert, oriented x 3 (person, place, & time)       Eyes: PERLA Respiratory: No evidence of acute respiratory distress  Thoracic Spine Area Exam  Skin & Axial Inspection: Significant thoracic kyphosis Alignment: Asymmetric Functional ROM: Unrestricted ROM Stability: No instability detected Muscle Tone/Strength: Functionally intact. No obvious neuro-muscular anomalies detected. Sensory (Neurological): Musculoskeletal pain pattern Muscle  strength & Tone: No palpable anomalies Lumbar Spine Area Exam  Skin & Axial Inspection: Lumbar Scoliosis Alignment: Levoscoliosis Functional ROM: Pain restricted ROM       Stability: No instability detected Muscle Tone/Strength: Functionally intact. No obvious neuro-muscular anomalies detected. Sensory (Neurological): Musculoskeletal pain pattern and neurogenic  Gait & Posture Assessment  Ambulation: Patient ambulates using a walker Gait: Limited. Using assistive device to ambulate Posture: Difficulty standing up straight, due to pain  Lower Extremity Exam    Side: Right lower extremity  Side: Left lower extremity  Stability: No instability observed          Stability: No instability  observed          Skin & Extremity Inspection: Skin color, temperature, and hair growth are WNL. No peripheral edema or cyanosis. No masses, redness, swelling, asymmetry, or associated skin lesions. No contractures.  Skin & Extremity Inspection: Skin color, temperature, and hair growth are WNL. No peripheral edema or cyanosis. No masses, redness, swelling, asymmetry, or associated skin lesions. No contractures.  Functional ROM: Pain restricted ROM for hip and knee joints          Functional ROM: Pain restricted ROM for hip and knee joints          Muscle Tone/Strength: Functionally intact. No obvious neuro-muscular anomalies detected.  Muscle Tone/Strength: Functionally intact. No obvious neuro-muscular anomalies detected.  Sensory (Neurological): Neurogenic pain pattern        Sensory (Neurological): Neurogenic pain pattern        DTR: Patellar: deferred today Achilles: deferred today Plantar: deferred today  DTR: Patellar: deferred today Achilles: deferred today Plantar: deferred today  Palpation: No palpable anomalies  Palpation: No palpable anomalies    Assessment  Primary Diagnosis & Pertinent Problem List: The primary encounter diagnosis was Spinal stenosis, lumbar region, with neurogenic claudication. Diagnoses of Compression fracture of T8 vertebra with delayed healing, subsequent encounter, History of kyphoplasty, Spinal stenosis, thoracic, Compression fracture of L2 vertebra, sequela, Lumbar degenerative disc disease, Encounter for long-term opiate analgesic use, and Chronic pain syndrome were also pertinent to this visit.  Visit Diagnosis (New problems to examiner): 1. Spinal stenosis, lumbar region, with neurogenic claudication   2. Compression fracture of T8 vertebra with delayed healing, subsequent encounter   3. History of kyphoplasty   4. Spinal stenosis, thoracic   5. Compression fracture of L2 vertebra, sequela   6. Lumbar degenerative disc disease   7. Encounter for  long-term opiate analgesic use   8. Chronic pain syndrome    Plan of Care (Initial workup plan)  Note: Ms. Wolaver was reminded that as per protocol, today's visit has been an evaluation only. We have not taken over the patient's controlled substance management.  Lab Orders         Compliance Drug Analysis, Ur      Baseline urine toxicology screen which is customary for new patients. Recommend that she discontinue baclofen given side effects of hallucinations.  Trial of Robaxin as below. Will see patient back in 2 weeks to sign pain contract after we have received her urine toxicology screen.  Will take her on for medication management: Oxycodone, acetaminophen 5 mg/325 3 times daily as needed  Pharmacotherapy (current): Medications ordered:  Meds ordered this encounter  Medications   methocarbamol (ROBAXIN) 500 MG tablet    Sig: Take 1 tablet (500 mg total) by mouth every 12 (twelve) hours as needed for muscle spasms.    Dispense:  60 tablet    Refill:  0    Do not place this medication, or any other prescription from our practice, on "Automatic Refill". Patient may have prescription filled one day early if pharmacy is closed on scheduled refill date.   Medications administered during this visit: Sabryn Preslar had no medications administered during this visit.   Pharmacological management options:  Opioid Analgesics: The patient was informed that there is no guarantee that she would be a candidate for opioid analgesics. The decision will be made following CDC guidelines. This decision will be based on the results of diagnostic studies, as well as Ms. Yankovich's risk profile.   Membrane stabilizer: To be determined at a later time  Muscle relaxant:  Robaxin trial  NSAID: To be determined at a later time  Other analgesic(s): To be determined at a later time    Provider-requested follow-up: Return in about 2 weeks (around 01/09/2022) for Medication Management, in person. I spent a total of  60 minutes reviewing chart data, face-to-face evaluation with the patient, counseling and coordination of care as detailed above.   Future Appointments  Date Time Provider [place]  01/14/2022  2:00 PM Loleta Dicker, [place] [facility] None  08/04/2022  9:15 AM Ralene Bathe, [place] [facility] None    Note by: Gillis Santa, MD Date: 12/26/2021; Time: 2:34 PM

## 2021-12-26 NOTE — Telephone Encounter (Signed)
-----   Message from Peggyann Shoals sent at 12/26/2021  3:19 PM EDT ----- Regarding: pain med refill Contact: [phone] Oxycodone '5mg'$  3 times a day [place] [facility] [employer] She was seen at the pain clinic today and they will not start prescribing her pain medication until 8/24 at her 2nd appt.

## 2021-12-26 NOTE — Patient Instructions (Addendum)
Alendronate Tablets What is this medication? ALENDRONATE (a LEN droe nate) prevents and treats osteoporosis. It may also be used to treat Paget disease of the bone. It works by making your bones stronger and less likely to break (fracture). It belongs to a group of medications called bisphosphonates. This medicine may be used for other purposes; ask your health care provider or pharmacist if you have questions. COMMON BRAND NAME(S): Fosamax What should I tell my care team before I take this medication? They need to know if you have any of these conditions: Bleeding disorder Cancer Dental disease Difficulty swallowing Infection (fever, chills, cough, sore throat, pain or trouble passing urine) Kidney disease Low levels of calcium or other minerals in the blood Low red blood cell counts Receiving steroids like dexamethasone or prednisone Stomach or intestine problems Trouble sitting or standing for 30 minutes An unusual or allergic reaction to alendronate, other medications, foods, dyes or preservatives Pregnant or trying to get pregnant Breast-feeding How should I use this medication? Take this medication by mouth with a full glass of water. Take it as directed on the prescription label at the same time every day. Take the dose right after waking up. Do not eat or drink anything before taking it. Do not take it with any other drink except water. Do not chew or crush the tablet. After taking it, do not eat breakfast, drink, or take any other medications or vitamins for at least 30 minutes. Sit or stand up for at least 30 minutes after you take it. Do not lie down. Keep taking it unless your care team tells you to stop. A special MedGuide will be given to you by the pharmacist with each prescription and refill. Be sure to read this information carefully each time. Talk to your care team about the use of this medication in children. Special care may be needed. Overdosage: If you think you have  taken too much of this medicine contact a poison control center or emergency room at once. NOTE: This medicine is only for you. Do not share this medicine with others. What if I miss a dose? If you take your medication once a day, skip it. Take your next dose at the scheduled time the next morning. Do not take two doses on the same day. If you take your medication once a week, take the missed dose on the morning after you remember. Do not take two doses on the same day. What may interact with this medication? Aluminum hydroxide Antacids Aspirin Calcium supplements Medications for inflammation like ibuprofen, naproxen, and others Iron supplements Magnesium supplements Vitamins with minerals This list may not describe all possible interactions. Give your health care provider a list of all the medicines, herbs, non-prescription drugs, or dietary supplements you use. Also tell them if you smoke, drink alcohol, or use illegal drugs. Some items may interact with your medicine. What should I watch for while using this medication? Visit your care team for regular checks on your progress. It may be some time before you see the benefit from this medication. Some people who take this medication have severe bone, joint, or muscle pain. This medication may also increase your risk for jaw problems or a broken thigh bone. Tell your care team right away if you have severe pain in your jaw, bones, joints, or muscles. Tell you care team if you have any pain that does not go away or that gets worse. Tell your dentist and dental surgeon that you are   taking this medication. You should not have major dental surgery while on this medication. See your dentist to have a dental exam and fix any dental problems before starting this medication. Take good care of your teeth while on this medication. Make sure you see your dentist for regular follow-up appointments. You should make sure you get enough calcium and vitamin D  while you are taking this medication. Discuss the foods you eat and the vitamins you take with your care team. You may need blood work done while you are taking this medication. What side effects may I notice from receiving this medication? Side effects that you should report to your care team as soon as possible: Allergic reactions--skin rash, itching, hives, swelling of the face, lips, tongue, or throat Low calcium level--muscle pain or cramps, confusion, tingling, or numbness in the hands or feet Osteonecrosis of the jaw--pain, swelling, or redness in the mouth, numbness of the jaw, poor healing after dental work, unusual discharge from the mouth, visible bones in the mouth Pain or trouble swallowing Severe bone, joint, or muscle pain Stomach bleeding--bloody or black, tar-like stools, vomiting blood or brown material that looks like coffee grounds Side effects that usually do not require medical attention (report to your care team if they continue or are bothersome): Constipation Diarrhea Nausea Stomach pain This list may not describe all possible side effects. Call your doctor for medical advice about side effects. You may report side effects to FDA at 1-800-FDA-1088. Where should I keep my medication? Keep out of the reach of children and pets. Store at room temperature between 15 and 30 degrees C (59 and 86 degrees F). Throw away any unused medication after the expiration date. NOTE: This sheet is a summary. It may not cover all possible information. If you have questions about this medicine, talk to your doctor, pharmacist, or health care provider.  2023 Elsevier/Gold Standard (2007-06-26 00:00:00) Methocarbamol Tablets What is this medication? METHOCARBAMOL (meth oh KAR ba mole) treats muscle pain and stiffness. It works by calming overactive nerves in your body, which helps your muscles relax. It belongs to a group of medications called muscle relaxants. This medicine may be used  for other purposes; ask your health care provider or pharmacist if you have questions. COMMON BRAND NAME(S): Robaxin What should I tell my care team before I take this medication? They need to know if you have any of these conditions: Kidney disease Seizures An unusual or allergic reaction to methocarbamol, other medications, foods, dyes, or preservatives Pregnant or trying to get pregnant Breast-feeding How should I use this medication? Take this medication by mouth with a full glass of water. Follow the directions on the prescription label. Take your medication at regular intervals. Do not take your medication more often than directed. Talk to your care team about the use of this medication in children. Special care may be needed. Overdosage: If you think you have taken too much of this medicine contact a poison control center or emergency room at once. NOTE: This medicine is only for you. Do not share this medicine with others. What if I miss a dose? If you miss a dose, take it as soon as you can. If it is almost time for your next dose, take only the next dose. Do not take double or extra doses. What may interact with this medication? Do not take this medication with any of the following: Narcotic medications for cough This medication may also interact with the following: Alcohol  Antihistamines for allergy, cough and cold Certain medications for anxiety or sleep Certain medications for depression like amitriptyline, fluoxetine, sertraline Certain medications for seizures like phenobarbital, primidone Cholinesterase inhibitors like neostigmine, ambenonium, and pyridostigmine bromide General anesthetics like halothane, isoflurane, methoxyflurane, propofol Local anesthetics like lidocaine, pramoxine, tetracaine Medications that relax muscles for surgery Narcotic medications for pain Phenothiazines like chlorpromazine, mesoridazine, prochlorperazine, thioridazine This list may not  describe all possible interactions. Give your health care provider a list of all the medicines, herbs, non-prescription drugs, or dietary supplements you use. Also tell them if you smoke, drink alcohol, or use illegal drugs. Some items may interact with your medicine. What should I watch for while using this medication? Tell your care team if your symptoms do not start to get better or if they get worse. You may get drowsy or dizzy. Do not drive, use machinery, or do anything that needs mental alertness until you know how this medication affects you. Do not stand or sit up quickly, especially if you are an older patient. This reduces the risk of dizzy or fainting spells. Alcohol may interfere with the effect of this medication. Avoid alcoholic drinks. If you are taking another medication that also causes drowsiness, you may have more side effects. Give your care team a list of all medications you use. Your care team will tell you how much medication to take. Do not take more medication than directed. Call emergency for help if you have problems breathing or unusual sleepiness. What side effects may I notice from receiving this medication? Side effects that you should report to your care team as soon as possible: Allergic reactions--skin rash, itching, hives, swelling of the face, lips, tongue, or throat CNS depression--slow or shallow breathing, shortness of breath, feeling faint, dizziness, confusion, trouble staying awake Side effects that usually do not require medical attention (report to your care team if they continue or are bothersome): Dizziness Drowsiness Headache Metallic taste in mouth Upset stomach This list may not describe all possible side effects. Call your doctor for medical advice about side effects. You may report side effects to FDA at 1-800-FDA-1088. Where should I keep my medication? Keep out of the reach of children. Store at room temperature between 20 and 25 degrees C (68  and 77 degrees F). Keep container tightly closed. Throw away any unused medication after the expiration date. NOTE: This sheet is a summary. It may not cover all possible information. If you have questions about this medicine, talk to your doctor, pharmacist, or health care provider.  2023 Elsevier/Gold Standard (2004-07-13 00:00:00)

## 2021-12-26 NOTE — Progress Notes (Signed)
Safety precautions to be maintained throughout the outpatient stay will include: orient to surroundings, keep bed in low position, maintain call bell within reach at all times, provide assistance with transfer out of bed and ambulation. Safety precautions to be maintained throughout the outpatient stay will include: orient to surroundings, keep bed in low position, maintain call bell within reach at all times, provide assistance with transfer out of bed and ambulation.  

## 2021-12-27 ENCOUNTER — Telehealth: Payer: Self-pay | Admitting: Student in an Organized Health Care Education/Training Program

## 2021-12-27 NOTE — Telephone Encounter (Signed)
Patient called to states that the paperwork she received states not to take the muscle relaxer with an opioid.  Instructed patient to discuss medication interactions with her pharmacist.

## 2021-12-27 NOTE — Telephone Encounter (Signed)
Patient stated that she have some questions that was prescribed for her to take. Please give patient a call. Thanks

## 2021-12-27 NOTE — Telephone Encounter (Signed)
Meds have been sent.

## 2021-12-31 LAB — COMPLIANCE DRUG ANALYSIS, UR

## 2022-01-01 ENCOUNTER — Telehealth: Payer: Self-pay | Admitting: Student in an Organized Health Care Education/Training Program

## 2022-01-01 NOTE — Telephone Encounter (Signed)
Patient wants to discuss some lab work, please call

## 2022-01-01 NOTE — Telephone Encounter (Signed)
Spoke with patient and answered her questions.

## 2022-01-09 ENCOUNTER — Ambulatory Visit
Payer: Medicare Other | Attending: Student in an Organized Health Care Education/Training Program | Admitting: Student in an Organized Health Care Education/Training Program

## 2022-01-09 ENCOUNTER — Ambulatory Visit: Payer: Medicare Other | Admitting: Neurosurgery

## 2022-01-09 ENCOUNTER — Encounter: Payer: Self-pay | Admitting: Student in an Organized Health Care Education/Training Program

## 2022-01-09 ENCOUNTER — Other Ambulatory Visit: Payer: Self-pay

## 2022-01-09 VITALS — BP 128/90 | HR 55 | Temp 96.8°F | Resp 16 | Ht 64.0 in | Wt 150.0 lb

## 2022-01-09 DIAGNOSIS — S22060G Wedge compression fracture of T7-T8 vertebra, subsequent encounter for fracture with delayed healing: Secondary | ICD-10-CM

## 2022-01-09 DIAGNOSIS — S32020S Wedge compression fracture of second lumbar vertebra, sequela: Secondary | ICD-10-CM

## 2022-01-09 DIAGNOSIS — G894 Chronic pain syndrome: Secondary | ICD-10-CM | POA: Diagnosis present

## 2022-01-09 DIAGNOSIS — Z0289 Encounter for other administrative examinations: Secondary | ICD-10-CM | POA: Insufficient documentation

## 2022-01-09 DIAGNOSIS — Z9889 Other specified postprocedural states: Secondary | ICD-10-CM | POA: Diagnosis present

## 2022-01-09 DIAGNOSIS — M4804 Spinal stenosis, thoracic region: Secondary | ICD-10-CM

## 2022-01-09 DIAGNOSIS — M5136 Other intervertebral disc degeneration, lumbar region: Secondary | ICD-10-CM | POA: Diagnosis present

## 2022-01-09 DIAGNOSIS — M48062 Spinal stenosis, lumbar region with neurogenic claudication: Secondary | ICD-10-CM | POA: Insufficient documentation

## 2022-01-09 MED ORDER — OXYCODONE-ACETAMINOPHEN 5-325 MG PO TABS: 1.0000 | ORAL_TABLET | Freq: Three times a day (TID) | ORAL | 0 refills | Status: DC | PRN

## 2022-01-09 MED ORDER — OXYCODONE-ACETAMINOPHEN 5-325 MG PO TABS: 1.0000 | ORAL_TABLET | Freq: Three times a day (TID) | ORAL | 0 refills | Status: AC | PRN

## 2022-01-09 NOTE — Patient Instructions (Signed)
Sign pain contract.

## 2022-01-09 NOTE — Progress Notes (Signed)
PROVIDER NOTE: Information contained herein reflects review and annotations entered in association with encounter. Interpretation of such information and data should be left to medically-trained personnel. Information provided to patient can be located elsewhere in the medical record under "Patient Instructions". Document created using STT-dictation technology, any transcriptional errors that may result from process are unintentional.    Patient: Laura Cole  Service Category: E/M  Provider: Gillis Santa, MD  DOB: 03-Jan-1941  DOS: 01/09/2022  Referring Provider: Baxter Hire, MD  MRN: [id number]  Specialty: Interventional Pain Management  PCP: Baxter Hire, MD  Type: Established Patient  Setting: Ambulatory outpatient    Location: Office  Delivery: Face-to-face     Primary Reason(s) for Visit: Encounter for evaluation before starting new chronic pain management plan of care (Level of risk: moderate) CC: Back Pain (Thoracic to lumbar bilateral worse on the right ), Wrist Pain, Knee Pain (Bilateral s/p joint replacement ), Ankle Pain (Bilateral ), and Hip Pain (Bilateral )  HPI  Laura Cole is a 81 y.o. year old, female patient, who comes today for a follow-up evaluation to review the test results and decide on a treatment plan. She has Gastroenteritis due to COVID-19 virus; Diarrhea due to COVID-19; Hypokalemia; Hyponatremia; Spinal stenosis, lumbar region, with neurogenic claudication; Chronic thoracic back pain; Closed fracture of dorsal (thoracic) vertebra ([place]); Compression fracture of thoracic vertebra with delayed healing; History of kyphoplasty; Spinal stenosis, thoracic; Compression fracture of L2 lumbar vertebra ([place]); Lumbar degenerative disc disease; Encounter for long-term opiate analgesic use; Chronic pain syndrome; and Pain management contract signed on their problem list. Her primarily concern today is the Back Pain (Thoracic to lumbar bilateral worse on the right ), Wrist Pain, Knee Pain  (Bilateral s/p joint replacement ), Ankle Pain (Bilateral ), and Hip Pain (Bilateral )  Pain Assessment: Location: Mid, Lower, Left, Right Back Radiating: sometimes back pain goes down the legs Onset: More than a month ago Duration: Chronic pain Quality: Discomfort, Constant, Nagging, Stabbing, Sharp Severity: 7 /10 (subjective, self-reported pain score)  Effect on ADL: difficulty getting in bed at night, has to climb up and try and get comfortable but during the time pain is excruciating. Timing: Constant Modifying factors: medications, rest BP: (!) 128/90  HR: (!) 55  Laura Cole comes in today for a follow-up visit after her initial evaluation on 01/01/2022.     HPI from initial clinic visit 12/26/2021 Laura Cole is a pleasant 81 year old female with a history of chronic thoracic pain related to thoracic specifically T8, T12 compression fractures resulting in significant thoracic kyphosis and thoracic spinal stenosis which is severe in her T12 region.  She also has a history of L2 lumbar compression fracture.  She describes mid back pain as well as abdominal pain given her intercostal discomfort in her right lower quadrant given her compression fractures.  She wants to avoid surgery.  She has had injections in the past which aggravated her pain.  She is very knowledgeable about her pain condition and what has helped in the past.  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  She states that she was his primary caregiver for 8 years after the double lung transplant.  She is currently on baclofen 10 mg twice a day.  She states that when she takes that with hydrocodone, she experiences hallucinations.  She also takes oxycodone 5 mg 3 times a day as needed which does provide her with analgesic and functional benefit.  She ambulates with a walker.  She does have appropriate gait, speed, balance while she is walking.  She is cognitively intact and sharp.  No side  effects noted with current opioid regimen.  Review of prior records demonstrates compliance with prior regimen.    Controlled Substance Pharmacotherapy Assessment REMS (Risk Evaluation and Mitigation Strategy)  Opioid Analgesic: Percocet 5 mg TID prn Pill Count: None expected due to no prior prescriptions written by our practice. Laura Billow, RN  01/09/2022  1:26 PM  Sign when Signing Visit Nursing Pain Medication Assessment:  Safety precautions to be maintained throughout the outpatient stay will include: orient to surroundings, keep bed in low position, maintain call bell within reach at all times, provide assistance with transfer out of bed and ambulation.  Medication Inspection Compliance: Pill count conducted under aseptic conditions, in front of the patient. Neither the pills nor the bottle was removed from the patient's sight at any time. Once count was completed pills were immediately returned to the patient in their original bottle.  Medication: Oxycodone/APAP Pill/Patch Count:  30 of 60 pills remain Pill/Patch Appearance: Markings consistent with prescribed medication Bottle Appearance: Standard pharmacy container. Clearly labeled. Filled Date: 08 / 10 / 2023 Last Medication intake:  Today   Pharmacokinetics: Liberation and absorption (onset of action): WNL Distribution (time to peak effect): WNL Metabolism and excretion (duration of action): WNL         Pharmacodynamics: Desired effects: Analgesia: [name] reports >50% benefit. Functional ability: Patient reports that medication allows her to accomplish basic ADLs Clinically meaningful improvement in function (CMIF): Sustained CMIF goals met Perceived effectiveness: Described as relatively effective, allowing for increase in activities of daily living (ADL) Undesirable effects: Side-effects or Adverse reactions: None reported Monitoring: [place] PMP: PDMP not reviewed this encounter. Online review of the past 24-month period previously conducted. Not applicable at this point since we have not taken over the patient's medication management yet. List of other Serum/Urine Drug Screening Test(s):  No results found for: "AMPHSCRSER", "BARBSCRSER", "BENZOSCRSER", "COCAINSCRSER", "COCAINSCRNUR", "PCPSCRSER", "THCSCRSER", "THCU", "CANNABQUANT", "OPIATESCRSER", "OXYSCRSER", "PROPOXSCRSER", "ETH", "CBDTHCR", "D8THCCBX", "D9THCCBX" List of all UDS test(s) done:  Lab Results  Component Value Date   SUMMARY Note 12/26/2021   Last UDS on record: Summary  Date Value Ref Range Status  12/26/2021 Note  Final    Comment:    ==================================================================== Compliance Drug Analysis, Ur ==================================================================== Test                             Result       Flag       Units  Drug Present and Declared for Prescription Verification   Oxycodone                      1083         EXPECTED   ng/mg creat   Oxymorphone                    1317         EXPECTED   ng/mg creat   Noroxycodone                   1906         EXPECTED   ng/mg creat   Noroxymorphone                 406  EXPECTED   ng/mg creat    Sources of oxycodone are scheduled prescription medications.    Oxymorphone, noroxycodone, and noroxymorphone are expected    metabolites of oxycodone. Oxymorphone is also available as a    scheduled prescription medication.    Baclofen                       PRESENT      EXPECTED   Acetaminophen                  PRESENT      EXPECTED   Metoprolol                     PRESENT      EXPECTED  Drug Absent but Declared for Prescription Verification   Methocarbamol                  Not Detected UNEXPECTED   Naproxen                       Not Detected UNEXPECTED ==================================================================== Test                      Result    Flag   Units      Ref Range   Creatinine              66               mg/dL       >=20 ==================================================================== Declared Medications:  The flagging and interpretation on this report are based on the  following declared medications.  Unexpected results may arise from  inaccuracies in the declared medications.   **Note: The testing scope of this panel includes these medications:   Baclofen (Lioresal)  Methocarbamol (Robaxin)  Metoprolol (Lopressor)  Naproxen (Naprosyn)  Oxycodone (Percocet)   **Note: The testing scope of this panel does not include small to  moderate amounts of these reported medications:   Acetaminophen (Percocet)   **Note: The testing scope of this panel does not include the  following reported medications:   Alendronate (Fosamax)  Amlodipine (Norvasc)  Ipratropium (Atrovent)  Levothyroxine (Synthroid)  Ondansetron (Zofran)  Pantoprazole (Protonix) ==================================================================== For clinical consultation, please call [phone]. ====================================================================    UDS interpretation: No unexpected findings.          Medication Assessment Form: Patient introduced to form today Treatment compliance: Not applicable Risk Assessment Profile: Aberrant behavior: See initial evaluations. None observed or detected today Comorbid factors increasing risk of overdose: See initial evaluation. No additional risks detected today Opioid risk tool (ORT):     12/26/2021   12:49 PM  Opioid Risk   Alcohol 0  Illegal Drugs 0  Rx Drugs 0  Alcohol 0  Illegal Drugs 0  Rx Drugs 0  Age between 16-45 years  0  History of Preadolescent Sexual Abuse 0  Psychological Disease 0  Depression 0  Opioid Risk Tool Scoring 0  Opioid Risk Interpretation Low Risk    ORT Scoring interpretation table:  Score <3 = Low Risk for SUD  Score between 4-7 = Moderate Risk for SUD  Score >8 = High Risk for Opioid Abuse   Risk of substance use  disorder (SUD): Low  Risk Mitigation Strategies:  Patient opioid safety counseling: Completed today. Counseling provided to patient as per "Patient Counseling Document". Document signed by patient, attesting to counseling and understanding Patient-Prescriber Agreement (PPA):  Obtained today.  Controlled substance notification to other providers: Written and sent today.  Pharmacologic Plan: Today we may be taking over the patient's pharmacological regimen. See below.             Laboratory Chemistry Profile   Renal Lab Results  Component Value Date   BUN 14 08/12/2021   CREATININE 0.91 08/12/2021   GFRAA >60 04/20/2018   GFRNONAA >60 08/12/2021   SPECGRAV 1.025 01/11/2019   PHUR 5.5 01/11/2019   PROTEINUR NEGATIVE 07/09/2021     Electrolytes Lab Results  Component Value Date   NA 140 08/12/2021   K 3.7 08/12/2021   CL 106 08/12/2021   CALCIUM 10.3 08/12/2021   MG 2.1 05/26/2021     Hepatic Lab Results  Component Value Date   AST 22 05/25/2021   ALT 15 05/25/2021   ALBUMIN 3.7 05/25/2021   ALKPHOS 72 05/25/2021   LIPASE 68 (H) 04/20/2018     ID Lab Results  Component Value Date   SARSCOV2NAA NEGATIVE 12/01/2019     Bone No results found for: "VD25OH", "VD125OH2TOT", "BZ1696VE9", "FY1017PZ0", "25OHVITD1", "25OHVITD2", "25ENIDPO2", "TESTOFREE", "TESTOSTERONE"   Endocrine Lab Results  Component Value Date   GLUCOSE 106 (H) 08/12/2021   GLUCOSEU NEGATIVE 07/09/2021   TSH 3.319 04/23/2020     Neuropathy No results found for: "VITAMINB12", "FOLATE", "HGBA1C", "HIV"   CNS No results found for: "COLORCSF", "APPEARCSF", "RBCCOUNTCSF", "WBCCSF", "POLYSCSF", "LYMPHSCSF", "EOSCSF", "PROTEINCSF", "GLUCCSF", "JCVIRUS", "CSFOLI", "IGGCSF", "LABACHR", "ACETBL"   Inflammation (CRP: Acute  ESR: Chronic) No results found for: "CRP", "ESRSEDRATE", "LATICACIDVEN"   Rheumatology No results found for: "RF", "ANA", "LABURIC", "URICUR", "LYMEIGGIGMAB", "LYMEABIGMQN", "HLAB27"    Coagulation Lab Results  Component Value Date   INR 1.0 08/12/2021   LABPROT 12.9 08/12/2021   PLT 296 08/12/2021     Cardiovascular Lab Results  Component Value Date   BNP 103.9 (H) 04/23/2020   TROPONINI <0.03 04/20/2018   HGB 13.0 08/12/2021   HCT 40.9 08/12/2021     Screening Lab Results  Component Value Date   SARSCOV2NAA NEGATIVE 12/01/2019     Cancer No results found for: "CEA", "CA125", "LABCA2"   Allergens No results found for: "ALMOND", "APPLE", "ASPARAGUS", "AVOCADO", "BANANA", "BARLEY", "BASIL", "BAYLEAF", "GREENBEAN", "LIMABEAN", "WHITEBEAN", "BEEFIGE", "REDBEET", "BLUEBERRY", "BROCCOLI", "CABBAGE", "MELON", "CARROT", "CASEIN", "CASHEWNUT", "CAULIFLOWER", "CELERY"     Note: Lab results reviewed.  Recent Diagnostic Imaging Review   Narrative CLINICAL DATA:  Bending injury on 05/07/2021.  Compression fracture.  EXAM: MRI THORACIC SPINE WITHOUT CONTRAST  TECHNIQUE: Multiplanar, multisequence MR imaging of the thoracic spine was performed. No intravenous contrast was administered.  COMPARISON:  Radiography 05/07/2021.  FINDINGS: Alignment: Scoliotic curvature convex to the right with the apex at T6.  Vertebrae: Old minor superior endplate depression at T4. Old healed augmented fracture at T8 with loss of height of 30%. Progression of the previously seen acute fracture at T12 with loss of height of 70% presently and retropulsion and or disc extrusion extending 5 mm posteriorly, encroaching upon the spinal canal, with effacement of the subarachnoid space and slight indentation of the cord. Superior endplate fracture anteriorly at L1 with loss of height 20% anteriorly but no posterior vertebral body involvement.  Cord: No primary cord lesion. See above for description of the T12 level.  Paraspinal and other soft tissues: Aortic atherosclerosis. Otherwise negative.  Disc levels:  No significant disc level pathology from T1-2 through  T11-12. Minimal non-compressive disc bulges.  As noted above, at T12 and T12-L1, there is 5  mm retropulsion of the inferior endplate and probably some associated disc material which encroaches upon the spinal canal, effacing the ventral subarachnoid space and indenting the ventral cord slightly.  IMPRESSION: Progression of the previously seen compression fracture at T12, now with loss of height of 70%. Inferior endplate shows retropulsion and possibly associated herniation of the T12-L1 disc by a distance of 5 mm, encroaching upon the spinal canal, effacing the ventral subarachnoid space and indenting the ventral cord slightly.  New superior endplate fracture anteriorly at L1 with loss of height anteriorly of 20%. No involvement of the posterior aspect of the vertebral body.   Electronically Signed By: Nelson Chimes M.D. On: 07/23/2021 16:43  Narrative CLINICAL DATA:  Chronic thoracic back pain, unspecified back pain laterality M54.6, G89.29 (ICD-10-CM). Chronic low back pain with sciatica, sciatica laterality unspecified, unspecified back pain laterality M54.40, G89.29 (ICD-10-CM).  EXAM: CT THORACIC AND LUMBAR SPINE WITHOUT CONTRAST  TECHNIQUE: Multidetector CT imaging of the thoracic and lumbar spine was performed without contrast. Multiplanar CT image reconstructions were also generated.  RADIATION DOSE REDUCTION: This exam was performed according to the departmental dose-optimization program which includes automated exposure control, adjustment of the mA and/or kV according to patient size and/or use of iterative reconstruction technique.  COMPARISON:  MRI of the thoracic spine July 23, 2021; MRI of the lumbar spine Oct 16, 2021.  FINDINGS: CT THORACIC SPINE FINDINGS  Alignment: S shaped scoliosis.  Vertebrae: No acute fracture or focal pathologic process. Chronic compression fracture of the T8 vertebral body status post vertebral augmentation. Chronic  compression fracture of the T12 vertebral body with approximately 80% height loss and retropulsion resulting in moderate to severe spinal canal stenosis, similar to prior MRI of the lumbar spine. Diffuse osteopenia.  Paraspinal and other soft tissues: Aortic calcified atherosclerotic disease. Biapical pleural pulmonary scarring.  Disc levels: Facet degenerative change resulting mild bilateral neural foraminal narrowing at T8-9 and on the right at T9-10. Moderate right and mild left neural foraminal narrowing at T10-11. Retropulsed bony fragment and facet osteoarthrosis results in moderate to severe spinal canal stenosis, severe right neural foraminal stenosis at T12-L1.  CT LUMBAR SPINE FINDINGS  Segmentation: 5 lumbar type vertebrae.  Alignment: Dextroconvex scoliosis.  Vertebrae: Interval progression of compression fracture of the L2 superior endplate now with approximately 55% height loss with a progressed bony retropulsion in the superior aspect of the posterior wall resulting in mild-to-moderate spinal canal stenosis at this level. Chronic L1 compression fracture status post vertebral augmentation with minimal amount of cement along the posterior wall with no spinal canal stenosis. Stable mild chronic compression fractures of the L4 and L5 superior endplates. Diffuse osteopenia.  Paraspinal and other soft tissues: Calcified aortic atherosclerotic disease.  Disc levels: At L1-2, bony retropulsion results in mild-to-moderate spinal canal stenosis. No high-grade spinal canal or neural foraminal stenosis in the other levels.  IMPRESSION: CT THORACIC SPINE  No new vertebral body fracture identified.  Stable chronic compression fractures of T8 and T12, status post vertebral augmentation at T8.  Retropulsion at the T12 level resulting moderate to severe spinal canal stenosis.  CT LUMBAR SPINE  Interval progression of height loss at L2, may represent acute on chronic  fracture. There is approximately 55% height loss and bony retropulsion resulting in mild-to-moderate spinal stenosis.   Electronically Signed By: Pedro Earls M.D. On: 11/08/2021 08:55   Narrative CLINICAL DATA:  Acute low back pain, unspecified back pain laterality, unspecified whether sciatica present  EXAM: THORACIC  SPINE - 3 VIEWS; LUMBAR SPINE - 2-3 VIEW  COMPARISON:  None Available.  FINDINGS: Thoracic spine:  Sigmoid scoliosis of the thoracolumbar spine. Previous vertebroplasty at T8. Chronic compression deformity of T12 with similar severe height loss. No new definite compression deformity of the thoracic spine. Osteopenia. Visualized soft tissues are unremarkable.  Lumbar spine:  Sigmoid scoliosis of the thoracolumbar spine. Previous vertebroplasty at L1. Questionable irregularity of the L2 superior endplate. No new definite compression deformity. Osteopenia. Atherosclerosis of the abdominal aorta. Surgical clips in the right upper quadrant.  IMPRESSION: Thoracic spine, lumbar spine:  1. No new definite acute compression deformity. There is questionable and subtle irregularity of the L2 superior endplate. If there is persistent clinical concern for acute fracture, consider short-term follow-up imaging or MRI as appropriate. 2. Sigmoid scoliosis of the thoracolumbar spine with stable findings of T8 and L1 vertebroplasty, and chronic severe compression deformity of the T12 vertebral body.   Electronically Signed By: Albin Felling M.D. On: 10/08/2021 12:27   MR LUMBAR SPINE WO CONTRAST  Addendum 10/17/2021  9:40 AM ADDENDUM REPORT: 10/17/2021 09:38  ADDENDUM: Axial T1 weighted images are now available and no additional finding is noted.   Electronically Signed By: Jorje Guild M.D. On: 10/17/2021 09:38  Narrative CLINICAL DATA:  History of compression fracture. History of kyphoplasty in March  EXAM: MRI LUMBAR SPINE  WITHOUT CONTRAST  TECHNIQUE: Multiplanar, multisequence MR imaging of the lumbar spine was performed. No intravenous contrast was administered.  COMPARISON:  09/04/2021  FINDINGS: Segmentation:  5 lumbar type vertebrae  Alignment:  Dextroscoliosis.  Mild T12-L1 anterolisthesis.  Vertebrae: Horizontal fracture plane to the L2 body with mild superior endplate depression. The fracture cleft contains fluid. Marrow edema mildly extends into the right pedicle.  Remote T12 compression fracture with cement augmentation and healed posteroinferior corner retropulsion. No evidence of aggressive bone lesion.  Conus medullaris and cauda equina: Conus extends to the L2 level. Conus and cauda equina appear normal.  Paraspinal and other soft tissues: Diffuse fatty atrophy of intrinsic back muscles  Disc levels:  T12- L1: Posttraumatic deformity of the T12 body with retropulsed bone impinging on the right foramen. Bone also contacts the ventral cord without compression  L1-L2: Mild disc bulging.  L2-L3: Disc narrowing and bulging with small right foraminal protrusion. No neural compression  L3-L4: Disc narrowing and bulging. Negative facets. No neural compression.  L4-L5: Disc narrowing and bulging. Negative facets. No neural compression  L5-S1:Unremarkable.  No axial T1 weighted imaging. Interrogation has been made and these will be reviewed if they were obtained by the technologist.  IMPRESSION: 1. Acute L2 compression fracture with mild height loss. 2. Remote T12 compression fracture with healed retropulsion causing advanced right foraminal impingement and mild crowding of the cord. 3. Lumbar spine degeneration with mild scoliosis. No degenerative impingement.  Electronically Signed: By: Jorje Guild M.D. On: 10/17/2021 09:07    Narrative CLINICAL DATA:  Chronic thoracic back pain, unspecified back pain laterality M54.6, G89.29 (ICD-10-CM). Chronic low back pain  with sciatica, sciatica laterality unspecified, unspecified back pain laterality M54.40, G89.29 (ICD-10-CM).  EXAM: CT THORACIC AND LUMBAR SPINE WITHOUT CONTRAST  TECHNIQUE: Multidetector CT imaging of the thoracic and lumbar spine was performed without contrast. Multiplanar CT image reconstructions were also generated.  RADIATION DOSE REDUCTION: This exam was performed according to the departmental dose-optimization program which includes automated exposure control, adjustment of the mA and/or kV according to patient size and/or use of iterative reconstruction technique.  COMPARISON:  MRI of the thoracic spine July 23, 2021; MRI of the lumbar spine Oct 16, 2021.  FINDINGS: CT THORACIC SPINE FINDINGS  Alignment: S shaped scoliosis.  Vertebrae: No acute fracture or focal pathologic process. Chronic compression fracture of the T8 vertebral body status post vertebral augmentation. Chronic compression fracture of the T12 vertebral body with approximately 80% height loss and retropulsion resulting in moderate to severe spinal canal stenosis, similar to prior MRI of the lumbar spine. Diffuse osteopenia.  Paraspinal and other soft tissues: Aortic calcified atherosclerotic disease. Biapical pleural pulmonary scarring.  Disc levels: Facet degenerative change resulting mild bilateral neural foraminal narrowing at T8-9 and on the right at T9-10. Moderate right and mild left neural foraminal narrowing at T10-11. Retropulsed bony fragment and facet osteoarthrosis results in moderate to severe spinal canal stenosis, severe right neural foraminal stenosis at T12-L1.  CT LUMBAR SPINE FINDINGS  Segmentation: 5 lumbar type vertebrae.  Alignment: Dextroconvex scoliosis.  Vertebrae: Interval progression of compression fracture of the L2 superior endplate now with approximately 55% height loss with a progressed bony retropulsion in the superior aspect of the posterior wall resulting in  mild-to-moderate spinal canal stenosis at this level. Chronic L1 compression fracture status post vertebral augmentation with minimal amount of cement along the posterior wall with no spinal canal stenosis. Stable mild chronic compression fractures of the L4 and L5 superior endplates. Diffuse osteopenia.  Paraspinal and other soft tissues: Calcified aortic atherosclerotic disease.  Disc levels: At L1-2, bony retropulsion results in mild-to-moderate spinal canal stenosis. No high-grade spinal canal or neural foraminal stenosis in the other levels.  IMPRESSION: CT THORACIC SPINE  No new vertebral body fracture identified.  Stable chronic compression fractures of T8 and T12, status post vertebral augmentation at T8.  Retropulsion at the T12 level resulting moderate to severe spinal canal stenosis.  CT LUMBAR SPINE  Interval progression of height loss at L2, may represent acute on chronic fracture. There is approximately 55% height loss and bony retropulsion resulting in mild-to-moderate spinal stenosis.   Electronically Signed By: Pedro Earls M.D. On: 11/08/2021 08:55 Complexity Note: Imaging results reviewed.                         Meds   Current Outpatient Medications:    amLODipine (NORVASC) 2.5 MG tablet, Take 2.5 mg by mouth in the morning and at bedtime., Disp: , Rfl:    ipratropium (ATROVENT) 0.06 % nasal spray, Place into the nose., Disp: , Rfl:    levothyroxine (SYNTHROID) 50 MCG tablet, Take 50 mcg by mouth daily before breakfast., Disp: , Rfl:    methocarbamol (ROBAXIN) 500 MG tablet, Take 1 tablet (500 mg total) by mouth every 12 (twelve) hours as needed for muscle spasms., Disp: 60 tablet, Rfl: 0   metoprolol tartrate (LOPRESSOR) 25 MG tablet, Take 12.5 mg by mouth 2 (two) times daily., Disp: , Rfl:    ondansetron (ZOFRAN-ODT) 4 MG disintegrating tablet, Take 4 mg by mouth every 8 (eight) hours as needed for nausea/vomiting., Disp: , Rfl:     pantoprazole (PROTONIX) 20 MG tablet, Take 20 mg by mouth 2 (two) times daily., Disp: , Rfl:    [START ON 01/17/2022] oxyCODONE-acetaminophen (PERCOCET) 5-325 MG tablet, Take 1 tablet by mouth 3 (three) times daily as needed for severe pain., Disp: 90 tablet, Rfl: 0   [START ON 02/16/2022] oxyCODONE-acetaminophen (PERCOCET) 5-325 MG tablet, Take 1 tablet by mouth 3 (three) times daily as needed for severe  pain., Disp: 90 tablet, Rfl: 0  ROS  Constitutional: Denies any fever or chills Gastrointestinal: No reported hemesis, hematochezia, vomiting, or acute GI distress Musculoskeletal: Denies any acute onset joint swelling, redness, loss of ROM, or weakness Neurological: No reported episodes of acute onset apraxia, aphasia, dysarthria, agnosia, amnesia, paralysis, loss of coordination, or loss of consciousness  Allergies  Ms. Cowsert is allergic to mushroom extract complex, promethazine, alprazolam, cefdinir, ciprofloxacin, dilaudid [hydromorphone hcl], hydromorphone, phenergan [promethazine hcl], pravastatin, and augmentin [amoxicillin-pot clavulanate].  PFSH  Drug: Ms. Zadrozny  reports no history of drug use. Alcohol:  reports that she does not currently use alcohol. Tobacco:  reports that she has never smoked. She has never used smokeless tobacco. Medical:  has a past medical history of Bradycardia, Cancer ([place]), Coronary artery disease, Diverticulitis, GERD (gastroesophageal reflux disease), Heart murmur, Hemorrhoids, Hypertension, Hypothyroidism, Osteoarthritis, Peripheral vascular disease ([place]), and TIA (transient ischemic attack). Surgical: Ms. Garverick  has a past surgical history that includes Abdominal hysterectomy; Rectocele repair; Cholecystectomy; Wrist surgery; Replacement total knee (Bilateral); Back surgery; TKRX2 (Bilateral); Eye surgery; Rectocele repair; Colon surgery; Colonoscopy with propofol (N/A, 12/05/2019); Esophagogastroduodenoscopy (egd) with propofol (N/A, 12/05/2019); IR Radiologist  Eval & Mgmt (07/30/2021); IR KYPHO LUMBAR INC FX REDUCE BONE BX UNI/BIL CANNULATION INC/IMAGING (08/12/2021); IR Radiologist Eval & Mgmt (09/05/2021); and IR Radiologist Eval & Mgmt (09/24/2021). Family: family history includes Breast cancer (age of onset: 36) in her sister.  Constitutional Exam  General appearance: Well nourished, well developed, and well hydrated. In no apparent acute distress Vitals:   01/09/22 1316  BP: (!) 128/90  Pulse: (!) 55  Resp: 16  Temp: (!) 96.8 F (36 C)  TempSrc: Temporal  SpO2: 100%  Weight: 150 lb (68 kg)  Height: 5' 4"  (1.626 m)   BMI Assessment: Estimated body mass index is 25.75 kg/m as calculated from the following:   Height as of this encounter: 5' 4"  (1.626 m).   Weight as of this encounter: 150 lb (68 kg).  BMI interpretation table: BMI level Category Range association with higher incidence of chronic pain  <18 kg/m2 Underweight   18.5-24.9 kg/m2 Ideal body weight   25-29.9 kg/m2 Overweight Increased incidence by 20%  30-34.9 kg/m2 Obese (Class I) Increased incidence by 68%  35-39.9 kg/m2 Severe obesity (Class II) Increased incidence by 136%  >40 kg/m2 Extreme obesity (Class III) Increased incidence by 254%   Patient's current BMI Ideal Body weight  Body mass index is 25.75 kg/m. Ideal body weight: 54.7 kg (120 lb 9.5 oz) Adjusted ideal body weight: 60 kg (132 lb 5.7 oz)   BMI Readings from Last 4 Encounters:  01/09/22 25.75 kg/m  12/26/21 26.64 kg/m  08/12/21 24.96 kg/m  07/09/21 26.19 kg/m   Wt Readings from Last 4 Encounters:  01/09/22 150 lb (68 kg)  12/26/21 150 lb 6.4 oz (68.2 kg)  08/12/21 150 lb (68 kg)  07/09/21 162 lb 4.1 oz (73.6 kg)    Psych/Mental status: Alert, oriented x 3 (person, place, & time)       Eyes: PERLA Respiratory: No evidence of acute respiratory distress    Thoracic Spine Area Exam  Skin & Axial Inspection: Significant thoracic kyphosis Alignment: Asymmetric Functional ROM: Unrestricted  ROM Stability: No instability detected Muscle Tone/Strength: Functionally intact. No obvious neuro-muscular anomalies detected. Sensory (Neurological): Musculoskeletal pain pattern Muscle strength & Tone: No palpable anomalies Lumbar Spine Area Exam  Skin & Axial Inspection: Lumbar Scoliosis Alignment: Levoscoliosis Functional ROM: Pain restricted ROM  Stability: No instability detected Muscle Tone/Strength: Functionally intact. No obvious neuro-muscular anomalies detected. Sensory (Neurological): Musculoskeletal pain pattern and neurogenic   Gait & Posture Assessment  Ambulation: Patient ambulates using a walker Gait: Limited. Using assistive device to ambulate Posture: Difficulty standing up straight, due to pain  Lower Extremity Exam      Side: Right lower extremity   Side: Left lower extremity  Stability: No instability observed           Stability: No instability observed          Skin & Extremity Inspection: Skin color, temperature, and hair growth are WNL. No peripheral edema or cyanosis. No masses, redness, swelling, asymmetry, or associated skin lesions. No contractures.   Skin & Extremity Inspection: Skin color, temperature, and hair growth are WNL. No peripheral edema or cyanosis. No masses, redness, swelling, asymmetry, or associated skin lesions. No contractures.  Functional ROM: Pain restricted ROM for hip and knee joints           Functional ROM: Pain restricted ROM for hip and knee joints          Muscle Tone/Strength: Functionally intact. No obvious neuro-muscular anomalies detected.   Muscle Tone/Strength: Functionally intact. No obvious neuro-muscular anomalies detected.  Sensory (Neurological): Neurogenic pain pattern         Sensory (Neurological): Neurogenic pain pattern        DTR: Patellar: deferred today Achilles: deferred today Plantar: deferred today   DTR: Patellar: deferred today Achilles: deferred today Plantar: deferred today  Palpation: No  palpable anomalies   Palpation: No palpable anomalies    Assessment & Plan  Primary Diagnosis & Pertinent Problem List: The primary encounter diagnosis was Spinal stenosis, lumbar region, with neurogenic claudication. Diagnoses of Compression fracture of T8 vertebra with delayed healing, subsequent encounter, History of kyphoplasty, Spinal stenosis, thoracic, Compression fracture of L2 vertebra, sequela, Lumbar degenerative disc disease, Pain management contract signed, and Chronic pain syndrome were also pertinent to this visit.  Visit Diagnosis: 1. Spinal stenosis, lumbar region, with neurogenic claudication   2. Compression fracture of T8 vertebra with delayed healing, subsequent encounter   3. History of kyphoplasty   4. Spinal stenosis, thoracic   5. Compression fracture of L2 vertebra, sequela   6. Lumbar degenerative disc disease   7. Pain management contract signed   8. Chronic pain syndrome    Problems updated and reviewed during this visit: Problem  Pain Management Contract Signed    Plan of Care  Pharmacotherapy (Medications Ordered): Meds ordered this encounter  Medications   oxyCODONE-acetaminophen (PERCOCET) 5-325 MG tablet    Sig: Take 1 tablet by mouth 3 (three) times daily as needed for severe pain.    Dispense:  90 tablet    Refill:  0   oxyCODONE-acetaminophen (PERCOCET) 5-325 MG tablet    Sig: Take 1 tablet by mouth 3 (three) times daily as needed for severe pain.    Dispense:  90 tablet    Refill:  0   Pharmacological management options:  Opioid Analgesics: We'll take over management today. See above orders Membrane stabilizer: Tried and failed Muscle relaxant:  Robaxin 250-500 mg daily prn NSAID: I will not be prescribing any at this time Other analgesic(s): I will not be prescribing any at this time   I spent a total of 60 minutes reviewing chart data, face-to-face evaluation with the patient, counseling and coordination of care as detailed  above.   Provider-requested follow-up: Return in about 2 months (around 03/11/2022)  for Medication Management, in person. Recent Visits Date Type Provider Dept  12/26/21 Office Visit Gillis Santa, MD [facility]  Showing recent visits within past 90 days and meeting all other requirements Today's Visits Date Type Provider Dept  01/09/22 Office Visit Gillis Santa, MD [facility]  Showing today's visits and meeting all other requirements Future Appointments Date Type Provider Dept  03/06/22 Appointment Gillis Santa, MD [facility]  Showing future appointments within next 90 days and meeting all other requirements  Primary Care Physician: Baxter Hire, MD Note by: Gillis Santa, MD Date: 01/09/2022; Time: 2:02 PM

## 2022-01-09 NOTE — Progress Notes (Signed)
Nursing Pain Medication Assessment:  Safety precautions to be maintained throughout the outpatient stay will include: orient to surroundings, keep bed in low position, maintain call bell within reach at all times, provide assistance with transfer out of bed and ambulation.  Medication Inspection Compliance: Pill count conducted under aseptic conditions, in front of the patient. Neither the pills nor the bottle was removed from the patient's sight at any time. Once count was completed pills were immediately returned to the patient in their original bottle.  Medication: Oxycodone/APAP Pill/Patch Count:  30 of 60 pills remain Pill/Patch Appearance: Markings consistent with prescribed medication Bottle Appearance: Standard pharmacy container. Clearly labeled. Filled Date: 08 / 10 / 2023 Last Medication intake:  Today

## 2022-01-13 ENCOUNTER — Other Ambulatory Visit: Payer: Self-pay

## 2022-01-13 DIAGNOSIS — M545 Low back pain, unspecified: Secondary | ICD-10-CM

## 2022-01-13 DIAGNOSIS — G8929 Other chronic pain: Secondary | ICD-10-CM

## 2022-01-14 ENCOUNTER — Ambulatory Visit
Admission: RE | Admit: 2022-01-14 | Discharge: 2022-01-14 | Disposition: A | Discharge status: Home / Self Care | Payer: Medicare Other | Attending: Neurosurgery | Admitting: Neurosurgery

## 2022-01-14 ENCOUNTER — Ambulatory Visit (INDEPENDENT_AMBULATORY_CARE_PROVIDER_SITE_OTHER): Payer: Medicare Other | Admitting: Neurosurgery

## 2022-01-14 ENCOUNTER — Ambulatory Visit
Admission: RE | Admit: 2022-01-14 | Discharge: 2022-01-14 | Disposition: A | Discharge status: Home / Self Care | Payer: Medicare Other | Source: Ambulatory Visit | Attending: Neurosurgery | Admitting: Neurosurgery

## 2022-01-14 ENCOUNTER — Encounter: Payer: Self-pay | Admitting: Neurosurgery

## 2022-01-14 VITALS — BP 128/78 | Ht 64.0 in | Wt 150.0 lb

## 2022-01-14 DIAGNOSIS — M545 Low back pain, unspecified: Secondary | ICD-10-CM | POA: Insufficient documentation

## 2022-01-14 DIAGNOSIS — M546 Pain in thoracic spine: Secondary | ICD-10-CM | POA: Diagnosis present

## 2022-01-14 DIAGNOSIS — M8000XD Age-related osteoporosis with current pathological fracture, unspecified site, subsequent encounter for fracture with routine healing: Secondary | ICD-10-CM | POA: Diagnosis not present

## 2022-01-14 DIAGNOSIS — G8929 Other chronic pain: Secondary | ICD-10-CM

## 2022-01-14 NOTE — Progress Notes (Signed)
Follow-up note: Referring Physician:  Baxter Hire, MD [place],  [place] [address]  Primary Physician:  Baxter Hire, MD  Chief Complaint:  f/u of L2 fracture   History of Present Illness: Jaedan Schuman is a 81 y.o. female who presents for 33-monthfollow-up.  She has had multiple compression fractures most recently at L2.  She reports some improvement of the severity of her pain since her last visit in June, she continues to have low back pain without radiating leg pain or lower extremity weakness.  She is currently seeing Dr. LHolley Raringfor pain management and is scheduled with endocrinology next month.  Her PCP did start Fosamax however she is only taking 1 dose of this and decide to discontinue after researching side effects at home.  LOV 10/25/21 LKASONDRA JUNODis a 81y.o. female who presents with the chief complaint of of worsening mid back that radiates to the hips. This [facility] about 2 weeks ago [facility] Since her last visit she has been seen by IR and undergone a kyphoplasty. She had an episode of increased pain in late May after leaning to wipe herself and heard a pop with associated increased pain. [facility]. MRI from 10/16/21 confirmed an acute fracture at this level.  She states that her pain has increased over the last couple of weeks. She is not currently wearing a back brace as the last she received does not fit well and causes increase in pain. [facility] better fitting brace however she needs this to be approved by her insurance first that she cannot pay for it out-of-pocket.  2.7.23 LSAMIKA VETSCHis a 81y.o. female who presents for 4 week follow up of T12 compression fracture. She continues to have severe back pain. She does describe some radiating pain into her left rib cage intermittently depending on positioning. She denies any  radiating leg pain. She has not yet been fitted for TLSO brace as she does not feel like it will help. She states her pain as worsened since stopping her Tramadol. She is still not interested in considering a kyphoplasty.  05/22/21 note by Dr. YIzora RibasMs. LDoloris Servantesis here today with a chief complaint of thoracic back pain that radiates to her right hip. She was seen in the emergency department after she bent over on December 19 to put on compression stockings. She had immediate onset of lower thoracic back pain. She does not have any weakness. Standing and walking make her pain worse. Sitting and medication somewhat helped. She lives independently, but this has dramatically impacted her ability to go about her day-to-day life Bowel/Bladder Dysfunction: none Conservative measures:  [facility] muscle weakness, gait difficulty, unsteadiness Multimodal medical therapy including regular antiinflammatories: oxycodone, tylenol Injections: has not received epidural steroid injections Past Surgery: T8 kyphoplasty LHewitt ShortsCatoe has no symptoms of cervical myelopathy.  Review of Systems:  A 10 point review of systems is negative, and the pertinent positives and negatives detailed in the HPI.  Past Medical History: Past Medical History:  Diagnosis Date   Brad[place]cardia    [place]   Coronary artery disease    Diverticulitis    GERD (gastroesophageal reflux disease)    Heart murmur    Hemorrhoids    Hypertension  Hypothyroidism    Osteoarthritis    Peripheral vascular disease ([place])    TIA (transient ischemic attack)     Past Surgical History: Past Surgical History:  Procedure Laterality Date   ABDOMINAL HYSTERECTOMY     BACK SURGERY     CHOLECYSTECTOMY     COLON SURGERY     COLONOSCOPY WITH PROPOFOL N/A 12/05/2019   Procedure: COLONOSCOPY WITH PROPOFOL;  Surgeon: Lesly Rubenstein, MD;  Location: [facility] ENDOSCOPY;   Service: Endoscopy;  Laterality: N/A;   ESOPHAGOGASTRODUODENOSCOPY (EGD) WITH PROPOFOL N/A 12/05/2019   Procedure: ESOPHAGOGASTRODUODENOSCOPY (EGD) WITH PROPOFOL;  Surgeon: Lesly Rubenstein, MD;  Location: [facility] ENDOSCOPY;  Service: Endoscopy;  Laterality: N/A;   EYE SURGERY     IR KYPHO LUMBAR INC FX REDUCE BONE BX UNI/BIL CANNULATION INC/IMAGING  08/12/2021   IR RADIOLOGIST EVAL & MGMT  07/30/2021   IR RADIOLOGIST EVAL & MGMT  09/05/2021   IR RADIOLOGIST EVAL & MGMT  09/24/2021   RECTOCELE REPAIR     RECTOCELE REPAIR     REPLACEMENT TOTAL KNEE Bilateral    TKRX2 Bilateral    WRIST SURGERY      Allergies: Allergies as of 01/14/2022 - Review Complete 01/09/2022  Allergen Reaction Noted   Mushroom extract complex Nausea Only and Nausea And Vomiting 10/16/2015   Promethazine  01/11/2019   Alprazolam  02/25/2018   Cefdinir Nausea Only 02/25/2018   Ciprofloxacin  02/25/2018   Dilaudid [hydromorphone hcl]  04/20/2018   Hydromorphone  12/02/2019   Phenergan [promethazine hcl]  04/20/2018   Pravastatin  01/11/2019   Augmentin [amoxicillin-pot clavulanate] Rash 08/12/2021    Medications: Outpatient Encounter Medications as of 01/14/2022  Medication Sig   amLODipine (NORVASC) 2.5 MG tablet Take 2.5 mg by mouth in the morning and at bedtime.   ipratropium (ATROVENT) 0.06 % nasal spray Place into the nose.   levothyroxine (SYNTHROID) 50 MCG tablet Take 50 mcg by mouth daily before breakfast.   methocarbamol (ROBAXIN) 500 MG tablet Take 1 tablet (500 mg total) by mouth every 12 (twelve) hours as needed for muscle spasms.   metoprolol tartrate (LOPRESSOR) 25 MG tablet Take 12.5 mg by mouth 2 (two) times daily.   ondansetron (ZOFRAN-ODT) 4 MG disintegrating tablet Take 4 mg by mouth every 8 (eight) hours as needed for nausea/vomiting.   [START ON 01/17/2022] oxyCODONE-acetaminophen (PERCOCET) 5-325 MG tablet Take 1 tablet by mouth 3 (three) times daily as needed for severe pain.   [START ON  02/16/2022] oxyCODONE-acetaminophen (PERCOCET) 5-325 MG tablet Take 1 tablet by mouth 3 (three) times daily as needed for severe pain.   pantoprazole (PROTONIX) 20 MG tablet Take 20 mg by mouth 2 (two) times daily.   No facility-administered encounter medications on file as of 01/14/2022.    Social History: Social History   Tobacco Use   Smoking status: Never   Smokeless tobacco: Never  Vaping Use   Vaping Use: Never used  Substance Use Topics   Alcohol use: Not Currently   Drug use: Never    Family Medical History: Family History  Problem Relation Age of Onset   Breast cancer Sister 5    Exam:    General: CN II-XII grossly intact  ROM of spine: limited due to pain.  Palpation of spine: diffuse TTP throughout thoracolumbar spine.  Strength MAEW  Gait slowed but steady with assistance of a rollator  Imaging: 01/14/22 thoracic xrays  01/14/22 lumbar xrays  I have personally reviewed the images and agree with the above interpretation.  Assessment and Plan: Ms. Yuille is a pleasant 81 y.o. female with history of osteoporosis and multiple compression fractures with chronic back pain.  She seems to have stabilized and her x-rays are reassuring however I am concerned about her ongoing risk of continued fractures.  We discussed this in detail and she is to follow-up with endocrinology next month to discuss additional treatment options which are strongly urged her to consider.  She is established with pain management and there is no indication for neurosurgical intervention at this time.  We will see her going forward on an as-needed basis.  She expressed understanding was in agreement with this plan.  I spent a total of 45 minutes in both face-to-face and non-face-to-face activities for this visit on the date of this encounter including reviewing outside records, reviewing imaging, discussion of treatment options, discussion of symptoms, physical exam, and documentation.  Cooper Render  PA-C Neurosurgery

## 2022-01-25 ENCOUNTER — Encounter: Payer: Self-pay | Admitting: Student in an Organized Health Care Education/Training Program

## 2022-02-11 ENCOUNTER — Other Ambulatory Visit: Payer: Self-pay | Admitting: Student in an Organized Health Care Education/Training Program

## 2022-02-11 MED ORDER — METHOCARBAMOL 500 MG PO TABS: 500.0000 mg | ORAL_TABLET | Freq: Two times a day (BID) | ORAL | 0 refills | Status: DC | PRN

## 2022-03-06 ENCOUNTER — Ambulatory Visit
Payer: Medicare Other | Attending: Student in an Organized Health Care Education/Training Program | Admitting: Student in an Organized Health Care Education/Training Program

## 2022-03-06 ENCOUNTER — Encounter: Payer: Self-pay | Admitting: Student in an Organized Health Care Education/Training Program

## 2022-03-06 VITALS — BP 142/53 | HR 49 | Temp 98.1°F | Resp 16 | Ht 61.0 in | Wt 147.0 lb

## 2022-03-06 DIAGNOSIS — M48062 Spinal stenosis, lumbar region with neurogenic claudication: Secondary | ICD-10-CM | POA: Diagnosis present

## 2022-03-06 DIAGNOSIS — G894 Chronic pain syndrome: Secondary | ICD-10-CM | POA: Diagnosis present

## 2022-03-06 DIAGNOSIS — S22060G Wedge compression fracture of T7-T8 vertebra, subsequent encounter for fracture with delayed healing: Secondary | ICD-10-CM | POA: Insufficient documentation

## 2022-03-06 DIAGNOSIS — Z9889 Other specified postprocedural states: Secondary | ICD-10-CM | POA: Diagnosis present

## 2022-03-06 DIAGNOSIS — S32020S Wedge compression fracture of second lumbar vertebra, sequela: Secondary | ICD-10-CM | POA: Diagnosis present

## 2022-03-06 MED ORDER — OXYCODONE-ACETAMINOPHEN 5-325 MG PO TABS: 1.0000 | ORAL_TABLET | Freq: Three times a day (TID) | ORAL | 0 refills | Status: AC | PRN

## 2022-03-06 MED ORDER — METHOCARBAMOL 500 MG PO TABS: 500.0000 mg | ORAL_TABLET | Freq: Two times a day (BID) | ORAL | 5 refills | Status: DC | PRN

## 2022-03-06 MED ORDER — OXYCODONE-ACETAMINOPHEN 5-325 MG PO TABS: 1.0000 | ORAL_TABLET | Freq: Three times a day (TID) | ORAL | 0 refills | Status: DC | PRN

## 2022-03-06 NOTE — Progress Notes (Signed)
PROVIDER NOTE: Information contained herein reflects review and annotations entered in association with encounter. Interpretation of such information and data should be left to medically-trained personnel. Information provided to patient can be located elsewhere in the medical record under "Patient Instructions". Document created using STT-dictation technology, any transcriptional errors that may result from process are unintentional.    Patient: Laura Cole  Service Category: E/M  Provider: Gillis Santa, MD  DOB: Jun 05, 1940  DOS: 03/06/2022  Referring Provider: Baxter Hire, MD  MRN: [id number]  Specialty: Interventional Pain Management  PCP: Baxter Hire, MD  Type: Established Patient  Setting: Ambulatory outpatient    Location: Office  Delivery: Face-to-face     HPI  [name], a 81 y.o. year old female, is here today because of her No primary diagnosis found.. [name] primary complain today is Back Pain (Upper to lower) Last encounter: My last encounter with her was on 02/11/2022. Pertinent problems: Ms. Willis has Spinal stenosis, lumbar region, with neurogenic claudication; Chronic thoracic back pain; Closed fracture of dorsal (thoracic) vertebra ([place]); Compression fracture of thoracic vertebra with delayed healing; History of kyphoplasty; Spinal stenosis, thoracic; Compression fracture of L2 lumbar vertebra ([place]); Lumbar degenerative disc disease; Encounter for long-term opiate analgesic use; Chronic pain syndrome; and Pain management contract signed on their pertinent problem list. Pain Assessment: Severity of Chronic pain is reported as a 6 /10. Location: Back Upper, Mid, Lower/sometimes goes down legs. Onset: More than a month ago. Quality: Aching, Sharp. Timing: Intermittent. Modifying factor(s): meds, rest. Vitals:  height is _0  (1.549 m) and weight is 147 lb (66.7 kg). Her temperature is 98.1 F (36.7 C). Her blood pressure is 142/53 (abnormal) and her pulse is 49  (abnormal). Her respiration is 16 and oxygen saturation is 99%.   Reason for encounter: medication management.  No change in medical history since last visit.  Patient's pain is at baseline.  Patient continues multimodal pain regimen as prescribed.  States that it provides pain relief and improvement in functional status.   Pharmacotherapy Assessment  Analgesic:  Percocet 5 mg TID prn MME= 22.5   Monitoring: [place] PMP: PDMP reviewed during this encounter.       Pharmacotherapy: No side-effects or adverse reactions reported. Compliance: No problems identified. Effectiveness: Clinically acceptable.  Dewayne Shorter, RN  03/06/2022  2:36 PM  Sign when Signing Visit Nursing Pain Medication Assessment:  Safety precautions to be maintained throughout the outpatient stay will include: orient to surroundings, keep bed in low position, maintain call bell within reach at all times, provide assistance with transfer out of bed and ambulation.  Medication Inspection Compliance: Pill count conducted under aseptic conditions, in front of the patient. Neither the pills nor the bottle was removed from the patient's sight at any time. Once count was completed pills were immediately returned to the patient in their original bottle.  Medication: Oxycodone IR Pill/Patch Count:  48 of 90 pills remain Pill/Patch Appearance: Markings consistent with prescribed medication Bottle Appearance: Standard pharmacy container. Clearly labeled. Filled Date: 09 / 30 / 2023 Last Medication intake:  Today    No results found for: "CBDTHCR" No results found for: "D8THCCBX" No results found for: "D9THCCBX"  UDS:  Summary  Date Value Ref Range Status  12/26/2021 Note  Final    Comment:    ==================================================================== Compliance Drug Analysis, Ur ==================================================================== Test  Result       Flag       Units  Drug  Present and Declared for Prescription Verification   Oxycodone                      1083         EXPECTED   ng/mg creat   Oxymorphone                    1317         EXPECTED   ng/mg creat   Noroxycodone                   1906         EXPECTED   ng/mg creat   Noroxymorphone                 406          EXPECTED   ng/mg creat    Sources of oxycodone are scheduled prescription medications.    Oxymorphone, noroxycodone, and noroxymorphone are expected    metabolites of oxycodone. Oxymorphone is also available as a    scheduled prescription medication.    Baclofen                       PRESENT      EXPECTED   Acetaminophen                  PRESENT      EXPECTED   Metoprolol                     PRESENT      EXPECTED  Drug Absent but Declared for Prescription Verification   Methocarbamol                  Not Detected UNEXPECTED   Naproxen                       Not Detected UNEXPECTED ==================================================================== Test                      Result    Flag   Units      Ref Range   Creatinine              66               mg/dL      >=20 ==================================================================== Declared Medications:  The flagging and interpretation on this report are based on the  following declared medications.  Unexpected results may arise from  inaccuracies in the declared medications.   **Note: The testing scope of this panel includes these medications:   Baclofen (Lioresal)  Methocarbamol (Robaxin)  Metoprolol (Lopressor)  Naproxen (Naprosyn)  Oxycodone (Percocet)   **Note: The testing scope of this panel does not include small to  moderate amounts of these reported medications:   Acetaminophen (Percocet)   **Note: The testing scope of this panel does not include the  following reported medications:   Alendronate (Fosamax)  Amlodipine (Norvasc)  Ipratropium (Atrovent)  Levothyroxine (Synthroid)  Ondansetron (Zofran)   Pantoprazole (Protonix) ==================================================================== For clinical consultation, please call [phone]. ====================================================================       ROS  Constitutional: Denies any fever or chills Gastrointestinal: No reported hemesis, hematochezia, vomiting, or acute GI distress Musculoskeletal:  mid thoracic and lumbar spine pain Neurological: No reported episodes of acute onset apraxia, aphasia,  dysarthria, agnosia, amnesia, paralysis, loss of coordination, or loss of consciousness  Medication Review  amLODipine, cinacalcet, ipratropium, levothyroxine, methocarbamol, metoprolol tartrate, ondansetron, oxyCODONE-acetaminophen, and pantoprazole  History Review  Allergy: [name] is allergic to mushroom extract complex, promethazine, alprazolam, cefdinir, ciprofloxacin, dilaudid [hydromorphone hcl], hydromorphone, phenergan [promethazine hcl], pravastatin, and augmentin [amoxicillin-pot clavulanate]. Drug: [name]  reports no history of drug use. Alcohol:  reports that she does not currently use alcohol. Tobacco:  reports that she has never smoked. She has never used smokeless tobacco. Social: [name]  reports that she has never smoked. She has never used smokeless tobacco. She reports that she does not currently use alcohol. She reports that she does not use drugs. Medical:  has a past medical history of Bradycardia, Cancer ([place]), Coronary artery disease, Diverticulitis, GERD (gastroesophageal reflux disease), Heart murmur, Hemorrhoids, Hypertension, Hypothyroidism, Osteoarthritis, Peripheral vascular disease ([place]), and TIA (transient ischemic attack). Surgical: [name]  has a past surgical history that includes Abdominal hysterectomy; Rectocele repair; Cholecystectomy; Wrist surgery; Replacement total knee (Bilateral); Back surgery; TKRX2 (Bilateral); Eye surgery; Rectocele repair; Colon surgery; Colonoscopy  with propofol (N/A, 12/05/2019); Esophagogastroduodenoscopy (egd) with propofol (N/A, 12/05/2019); IR Radiologist Eval & Mgmt (07/30/2021); IR KYPHO LUMBAR INC FX REDUCE BONE BX UNI/BIL CANNULATION INC/IMAGING (08/12/2021); IR Radiologist Eval & Mgmt (09/05/2021); and IR Radiologist Eval & Mgmt (09/24/2021). Family: family history includes Breast cancer (age of onset: 64) in her sister.  Laboratory Chemistry Profile   Renal Lab Results  Component Value Date   BUN 14 08/12/2021   CREATININE 0.91 08/12/2021   GFRAA >60 04/20/2018   GFRNONAA >60 08/12/2021    Hepatic Lab Results  Component Value Date   AST 22 05/25/2021   ALT 15 05/25/2021   ALBUMIN 3.7 05/25/2021   ALKPHOS 72 05/25/2021   LIPASE 68 (H) 04/20/2018    Electrolytes Lab Results  Component Value Date   NA 140 08/12/2021   K 3.7 08/12/2021   CL 106 08/12/2021   CALCIUM 10.3 08/12/2021   MG 2.1 05/26/2021    Bone No results found for: "VD25OH", "VD125OH2TOT", "SW9675FF6", "BW4665LD3", "25OHVITD1", "25OHVITD2", "25OHVITD3", "TESTOFREE", "TESTOSTERONE"  Inflammation (CRP: Acute Phase) (ESR: Chronic Phase) No results found for: "CRP", "ESRSEDRATE", "LATICACIDVEN"       Note: Above Lab results reviewed.  Recent Imaging Review  DG Thoracic Spine 2 View CLINICAL DATA:  Chronic back pain. History of prior fracture. History of prior kyphoplasties.  EXAM: THORACIC SPINE 2 VIEWS  COMPARISON:  Lumbar spine series 01/14/2022. CT thoracic spine 11/07/2021. Thoracic spine series 10/08/2021.  FINDINGS: Severe thoracolumbar spine scoliosis. Diffuse osteopenia and multilevel degenerative change again noted. Prior T8 and L1 vertebroplasties again noted. Chronic T12 compression fracture again noted without interim change. Progressive L2 compression fracture, reference is made to today accompanying lumbar spine series report.  IMPRESSION: 1. Severe thoracolumbar spine scoliosis. Diffuse osteopenia multilevel degenerative change  again noted. Prior T8 and L1 vertebroplasties again noted. Chronic T12 compression fracture again noted without interim change.  2. Progressive L2 compression fracture, reference is made to today's accompanying lumbar spine series report.  Electronically Signed   By: Marcello Moores  Register M.D.   On: 01/15/2022 07:52 DG Lumbar Spine 2-3 Views CLINICAL DATA:  Back pain. Prior history of fracture and kyphoplasty.  EXAM: LUMBAR SPINE - 2-3 VIEW  COMPARISON:  CT lumbar spine 11/07/2021. MRI lumbar spine 10/16/2021. Lumbar spine series 10/08/2021.  FINDINGS: Lumbar spine vertebral bodies numbered as per prior CT. Surgical clips right upper quadrant. Large amount of stool  noted throughout the colon. Nonspecific air-filled loops of small bowel noted. Aortoiliac atherosclerotic vascular calcification. Severe thoracolumbar spine scoliosis. Diffuse osteopenia and multilevel degenerative change again noted. Prior T8 thoracic kyphoplasty. Prior L1 kyphoplasty. Previously noted L2 compression fracture has progressed with near complete collapse on today's exam. Stable T12 compression fracture.  IMPRESSION: 1. Interim progression of L2 compression fracture with near complete collapse on today's exam.  2. Severe thoracolumbar scoliosis. Diffuse osteopenia and multilevel degenerative change again noted. Prior T8 and L1 kyphoplasties. Stable T12 compression fracture.  3.  Large amount of stool noted throughout the colon.  4.  Aortoiliac atherosclerotic vascular disease.  Electronically Signed   By: Marcello Moores  Register M.D.   On: 01/15/2022 07:45 Note: Reviewed        Physical Exam  General appearance: Well nourished, well developed, and well hydrated. In no apparent acute distress Mental status: Alert, oriented x 3 (person, place, & time)       Respiratory: No evidence of acute respiratory distress Eyes: PERLA Vitals: BP (!) 142/53   Pulse (!) 49   Temp 98.1 F (36.7 C)   Resp 16   Ht  _0  (1.549 m)   Wt 147 lb (66.7 kg)   LMP  (LMP Unknown)   SpO2 99%   BMI 27.78 kg/m  BMI: Estimated body mass index is 27.78 kg/m as calculated from the following:   Height as of this encounter: _1  (1.549 m).   Weight as of this encounter: 147 lb (66.7 kg). Ideal: Ideal body weight: 47.8 kg (105 lb 6.1 oz) Adjusted ideal body weight: 55.4 kg (122 lb 0.5 oz)  Thoracic Spine Area Exam  Skin & Axial Inspection: Significant thoracic kyphosis Alignment: Asymmetric Functional ROM: Unrestricted ROM Stability: No instability detected Muscle Tone/Strength: Functionally intact. No obvious neuro-muscular anomalies detected. Sensory (Neurological): Musculoskeletal pain pattern Muscle strength & Tone: No palpable anomalies Lumbar Spine Area Exam  Skin & Axial Inspection: Lumbar Scoliosis Alignment: Levoscoliosis Functional ROM: Pain restricted ROM       Stability: No instability detected Muscle Tone/Strength: Functionally intact. No obvious neuro-muscular anomalies detected. Sensory (Neurological): Musculoskeletal pain pattern and neurogenic   Gait & Posture Assessment  Ambulation: Patient ambulates using a walker Gait: Limited. Using assistive device to ambulate Posture: Difficulty standing up straight, due to pain  Lower Extremity Exam      Side: Right lower extremity   Side: Left lower extremity  Stability: No instability observed           Stability: No instability observed          Skin & Extremity Inspection: Skin color, temperature, and hair growth are WNL. No peripheral edema or cyanosis. No masses, redness, swelling, asymmetry, or associated skin lesions. No contractures.   Skin & Extremity Inspection: Skin color, temperature, and hair growth are WNL. No peripheral edema or cyanosis. No masses, redness, swelling, asymmetry, or associated skin lesions. No contractures.  Functional ROM: Pain restricted ROM for hip and knee joints           Functional ROM: Pain restricted ROM for  hip and knee joints          Muscle Tone/Strength: Functionally intact. No obvious neuro-muscular anomalies detected.   Muscle Tone/Strength: Functionally intact. No obvious neuro-muscular anomalies detected.  Sensory (Neurological): Neurogenic pain pattern         Sensory (Neurological): Neurogenic pain pattern        DTR: Patellar: deferred today Achilles: deferred today Plantar: deferred today  DTR: Patellar: deferred today Achilles: deferred today Plantar: deferred today  Palpation: No palpable anomalies   Palpation: No palpable anomalies       Assessment   Diagnosis Status  1. Spinal stenosis, lumbar region, with neurogenic claudication   2. Compression fracture of T8 vertebra with delayed healing, subsequent encounter   3. History of kyphoplasty   4. Compression fracture of L2 vertebra, sequela   5. Chronic pain syndrome    Controlled Controlled Controlled   Updated Problems: Problem  Pain Management Contract Signed  Compression Fracture of Thoracic Vertebra With Delayed Healing  History of Kyphoplasty  Spinal Stenosis, Thoracic  Compression Fracture of L2 Lumbar Vertebra ([facility])  Lumbar Degenerative Disc Disease  Encounter for Long-Term Opiate Analgesic Use  Chronic Pain Syndrome  Spinal Stenosis, Lumbar Region, With Neurogenic Claudication  Closed Fracture of Dorsal (Thoracic) Vertebra ([facility])  Chronic Thoracic Back Pain     Plan of Care   [name] has a current medication list which includes the following long-term medication(s): amlodipine, ipratropium, levothyroxine, metoprolol tartrate, and pantoprazole.  Pharmacotherapy (Medications Ordered): Meds ordered this encounter  Medications   oxyCODONE-acetaminophen (PERCOCET) 5-325 MG tablet    Sig: Take 1 tablet by mouth 3 (three) times daily as needed for severe pain.    Dispense:  90 tablet    Refill:  0   oxyCODONE-acetaminophen (PERCOCET) 5-325 MG tablet    Sig: Take 1 tablet by mouth 3 (three)  times daily as needed for severe pain.    Dispense:  90 tablet    Refill:  0   methocarbamol (ROBAXIN) 500 MG tablet    Sig: Take 1 tablet (500 mg total) by mouth every 12 (twelve) hours as needed for muscle spasms.    Dispense:  60 tablet    Refill:  5    Do not place this medication, or any other prescription from our practice, on "Automatic Refill". Patient may have prescription filled one day early if pharmacy is closed on scheduled refill date.    Follow-up plan:   Return in about 10 weeks (around 05/15/2022) for Medication Management, in person.    Recent Visits Date Type Provider Dept  01/09/22 Office Visit Gillis Santa, MD [facility]  12/26/21 Office Visit Gillis Santa, MD [facility]  Showing recent visits within past 90 days and meeting all other requirements Today's Visits Date Type Provider Dept  03/06/22 Office Visit Gillis Santa, MD [facility]  Showing today's visits and meeting all other requirements Future Appointments No visits were found meeting these conditions. Showing future appointments within next 90 days and meeting all other requirements  I discussed the assessment and treatment plan with the patient. The patient was provided an opportunity to ask questions and all were answered. The patient agreed with the plan and demonstrated an understanding of the instructions.  Patient advised to call back or seek an in-person evaluation if the symptoms or condition worsens.  Duration of encounter: 87mnutes.  Total time on encounter, as per AMA guidelines included both the face-to-face and non-face-to-face time personally spent by the physician and/or other qualified health care professional(s) on the day of the encounter (includes time in activities that require the physician or other qualified health care professional and does not include time in activities normally performed by clinical staff). Physician's time may include the  following activities when performed: preparing to see the patient (eg, review of tests, pre-charting review of records) obtaining and/or reviewing separately obtained history performing  a medically appropriate examination and/or evaluation counseling and educating the patient/family/caregiver ordering medications, tests, or procedures referring and communicating with other health care professionals (when not separately reported) documenting clinical information in the electronic or other health record independently interpreting results (not separately reported) and communicating results to the patient/ family/caregiver care coordination (not separately reported)  Note by: Gillis Santa, MD Date: 03/06/2022; Time: 2:57 PM

## 2022-03-06 NOTE — Progress Notes (Signed)
Nursing Pain Medication Assessment:  Safety precautions to be maintained throughout the outpatient stay will include: orient to surroundings, keep bed in low position, maintain call bell within reach at all times, provide assistance with transfer out of bed and ambulation.  Medication Inspection Compliance: Pill count conducted under aseptic conditions, in front of the patient. Neither the pills nor the bottle was removed from the patient's sight at any time. Once count was completed pills were immediately returned to the patient in their original bottle.  Medication: Oxycodone IR Pill/Patch Count:  48 of 90 pills remain Pill/Patch Appearance: Markings consistent with prescribed medication Bottle Appearance: Standard pharmacy container. Clearly labeled. Filled Date: 09 / 30 / 2023 Last Medication intake:  Today

## 2022-03-10 ENCOUNTER — Encounter: Payer: Self-pay | Admitting: *Deleted

## 2022-03-10 ENCOUNTER — Encounter: Payer: Self-pay | Admitting: Student in an Organized Health Care Education/Training Program

## 2022-03-21 ENCOUNTER — Encounter: Payer: Self-pay | Admitting: Student in an Organized Health Care Education/Training Program

## 2022-03-21 ENCOUNTER — Telehealth: Payer: Self-pay | Admitting: Student in an Organized Health Care Education/Training Program

## 2022-03-21 NOTE — Telephone Encounter (Signed)
Dr Holley Raring is not in the office today.  Will discuss with him on Monday.

## 2022-03-21 NOTE — Telephone Encounter (Signed)
PT stated that her medications can't be delivery until 03-29-22. PT stated that she couldn't wait that long. PT will like to refill prescripton of oxycodone 5-325 at [facility]. PT stated that she cancel the other prescription with [facility]. PT asked to be giving a call when it's been send it. Thanks

## 2022-05-06 ENCOUNTER — Encounter: Payer: Self-pay | Admitting: Neurosurgery

## 2022-05-06 ENCOUNTER — Ambulatory Visit
Admission: RE | Admit: 2022-05-06 | Discharge: 2022-05-06 | Disposition: A | Discharge status: Home / Self Care | Payer: Medicare Other | Source: Ambulatory Visit | Attending: Neurosurgery | Admitting: Neurosurgery

## 2022-05-06 ENCOUNTER — Ambulatory Visit
Admission: RE | Admit: 2022-05-06 | Discharge: 2022-05-06 | Disposition: A | Discharge status: Home / Self Care | Payer: Medicare Other | Attending: Neurosurgery | Admitting: Neurosurgery

## 2022-05-06 ENCOUNTER — Ambulatory Visit (INDEPENDENT_AMBULATORY_CARE_PROVIDER_SITE_OTHER): Payer: Medicare Other | Admitting: Neurosurgery

## 2022-05-06 VITALS — BP 128/68 | Ht 61.0 in | Wt 147.0 lb

## 2022-05-06 DIAGNOSIS — G8929 Other chronic pain: Secondary | ICD-10-CM | POA: Insufficient documentation

## 2022-05-06 DIAGNOSIS — M546 Pain in thoracic spine: Secondary | ICD-10-CM | POA: Insufficient documentation

## 2022-05-06 DIAGNOSIS — M5442 Lumbago with sciatica, left side: Secondary | ICD-10-CM | POA: Diagnosis not present

## 2022-05-06 DIAGNOSIS — M5441 Lumbago with sciatica, right side: Secondary | ICD-10-CM | POA: Diagnosis not present

## 2022-05-06 NOTE — Progress Notes (Signed)
Follow-up note: Referring Physician:  Baxter Hire, MD [place],  [place] [id number]  Primary Physician:  Baxter Hire, MD  Chief Complaint:  f/u of L2 fracture   History of Present Illness:  05/06/22 Laura Cole is a 81 y.o female presenting today at her request for further evaluation of persistent and worsening thoracic back pain. She was last seen on 01/14/22 and discharged.  Today she reports worsening back pain after getting into her car in November. Unfortunately it has persisted and she reports pain that radiates around her chest wall. She denies any new weakness.   01/14/22 Laura Cole is a 81 y.o. female who presents for 39-monthfollow-up.  She has had multiple compression fractures most recently at L2.  She reports some improvement of the severity of her pain since her last visit in June, she continues to have low back pain without radiating leg pain or lower extremity weakness.  She is currently seeing Dr. LHolley Raringfor pain management and is scheduled with endocrinology next month.  Her PCP did start Fosamax however she is only taking 1 dose of this and decide to discontinue after researching side effects at home.  10/25/21 Laura Cole a 81y.o. female who presents with the chief complaint of of worsening mid back that radiates to the hips. This started about 2 weeks ago after [facility] Since her last visit she has been seen by IR and undergone a kyphoplasty. She had an episode of increased pain in late May after leaning to wipe herself and heard a pop with associated increased pain. [facility]. MRI from 10/16/21 confirmed an acute fracture at this level.  She states that her pain has increased over the last couple of weeks. She is not currently wearing a back brace as the last she received does not fit well and causes increase in pain. [facility] better fitting brace however she needs this to be approved by her insurance first that she cannot pay for it out-of-pocket.  2.7.23 Laura Cole a 81y.o. female who presents for 4 week follow up of T12 compression fracture. She continues to have severe back pain. She does describe some radiating pain into her left rib cage intermittently depending on positioning. She denies any radiating leg pain. She has not yet been fitted for TLSO brace as she does not feel like it will help. She states her pain as worsened since stopping her Tramadol. She is still not interested in considering a kyphoplasty.  05/22/21 note by Dr. YIzora RibasMs. Laura Cole here today with a chief complaint of thoracic back pain that radiates to her right hip. She was seen in the emergency department after she bent over on December 19 to put on compression stockings. She had immediate onset of lower thoracic back pain. She does not have any weakness. Standing and walking make her pain worse. Sitting and medication somewhat helped. She lives independently, but this has dramatically impacted her ability to go about her day-to-day life Bowel/Bladder Dysfunction: none Conservative measures:  [facility] muscle weakness, gait difficulty, unsteadiness Multimodal medical therapy including regular antiinflammatories: oxycodone, tylenol Injections: has not received epidural steroid injections Past Surgery: T8 kyphoplasty Laura Cole has no symptoms of cervical myelopathy.  Review of Systems:  A 10 point  review of systems is negative, and the pertinent positives and negatives detailed in the HPI.  Past Medical History: Past Medical History:  Diagnosis Date   Bradycardia    Cancer ([place])    skin cancer   Coronary artery disease    Diverticulitis    GERD (gastroesophageal reflux disease)    Heart murmur    Hemorrhoids    Hypertension    Hypothyroidism     Osteoarthritis    Peripheral vascular disease ([place])    TIA (transient ischemic attack)     Past Surgical History: Past Surgical History:  Procedure Laterality Date   ABDOMINAL HYSTERECTOMY     BACK SURGERY     CHOLECYSTECTOMY     COLON SURGERY     COLONOSCOPY WITH PROPOFOL N/A 12/05/2019   Procedure: COLONOSCOPY WITH PROPOFOL;  Surgeon: Lesly Rubenstein, MD;  Location: [facility] ENDOSCOPY;  Service: Endoscopy;  Laterality: N/A;   ESOPHAGOGASTRODUODENOSCOPY (EGD) WITH PROPOFOL N/A 12/05/2019   Procedure: ESOPHAGOGASTRODUODENOSCOPY (EGD) WITH PROPOFOL;  Surgeon: Lesly Rubenstein, MD;  Location: [facility] ENDOSCOPY;  Service: Endoscopy;  Laterality: N/A;   EYE SURGERY     IR KYPHO LUMBAR INC FX REDUCE BONE BX UNI/BIL CANNULATION INC/IMAGING  08/12/2021   IR RADIOLOGIST EVAL & MGMT  07/30/2021   IR RADIOLOGIST EVAL & MGMT  09/05/2021   IR RADIOLOGIST EVAL & MGMT  09/24/2021   RECTOCELE REPAIR     RECTOCELE REPAIR     REPLACEMENT TOTAL KNEE Bilateral    TKRX2 Bilateral    WRIST SURGERY      Allergies: Allergies as of 01/14/2022 - Review Complete 01/09/2022  Allergen Reaction Noted   Mushroom extract complex Nausea Only and Nausea And Vomiting 10/16/2015   Promethazine  01/11/2019   Alprazolam  02/25/2018   Cefdinir Nausea Only 02/25/2018   Ciprofloxacin  02/25/2018   Dilaudid [hydromorphone hcl]  04/20/2018   Hydromorphone  12/02/2019   Phenergan [promethazine hcl]  04/20/2018   Pravastatin  01/11/2019   Augmentin [amoxicillin-pot clavulanate] Rash 08/12/2021    Medications: Outpatient Encounter Medications as of 01/14/2022  Medication Sig   amLODipine (NORVASC) 2.5 MG tablet Take 2.5 mg by mouth in the morning and at bedtime.   ipratropium (ATROVENT) 0.06 % nasal spray Place into the nose.   levothyroxine (SYNTHROID) 50 MCG tablet Take 50 mcg by mouth daily before breakfast.   methocarbamol (ROBAXIN) 500 MG tablet Take 1 tablet (500 mg total) by mouth every 12 (twelve) hours as  needed for muscle spasms.   metoprolol tartrate (LOPRESSOR) 25 MG tablet Take 12.5 mg by mouth 2 (two) times daily.   ondansetron (ZOFRAN-ODT) 4 MG disintegrating tablet Take 4 mg by mouth every 8 (eight) hours as needed for nausea/vomiting.   [START ON 01/17/2022] oxyCODONE-acetaminophen (PERCOCET) 5-325 MG tablet Take 1 tablet by mouth 3 (three) times daily as needed for severe pain.   [START ON 02/16/2022] oxyCODONE-acetaminophen (PERCOCET) 5-325 MG tablet Take 1 tablet by mouth 3 (three) times daily as needed for severe pain.   pantoprazole (PROTONIX) 20 MG tablet Take 20 mg by mouth 2 (two) times daily.   No facility-administered encounter medications on file as of 01/14/2022.    Social History: Social History   Tobacco Use   Smoking status: Never   Smokeless tobacco: Never  Vaping Use   Vaping Use: Never used  Substance Use Topics   Alcohol use: Not Currently   Drug use: Never    Family Medical History: Family History  Problem Relation Age of Onset  Breast cancer Sister 68    Exam:  General: CN II-XII grossly intact  ROM of spine: limited Palpation of spine: diffuse TTP throughout thoracolumbar spine and chest wall.  Strength MAEW  Gait remains slowed but steady with assistance of a rollator  Imaging: 01/14/22 thoracic xrays IMPRESSION: 1. Severe thoracolumbar spine scoliosis. Diffuse osteopenia multilevel degenerative change again noted. Prior T8 and L1 vertebroplasties again noted. Chronic T12 compression fracture again noted without interim change.   2. Progressive L2 compression fracture, reference is made to today's accompanying lumbar spine series report.     Electronically Signed   By: Marcello Moores  Register M.D.   On: 01/15/2022 07:52  01/14/22 lumbar xrays IMPRESSION: 1. Interim progression of L2 compression fracture with near complete collapse on today's exam.   2. Severe thoracolumbar scoliosis. Diffuse osteopenia and multilevel degenerative change again  noted. Prior T8 and L1 kyphoplasties. Stable T12 compression fracture.   3.  Large amount of stool noted throughout the colon.   4.  Aortoiliac atherosclerotic vascular disease.     Electronically Signed   By: Marcello Moores  Register M.D.   On: 01/15/2022 07:45  I have personally reviewed the images and agree with the above interpretation.  Assessment and Plan: Ms. Wailes is a pleasant 81 y.o. female with history of osteoporosis and multiple compression fractures with chronic back pain.  Given her history of severe osteoporosis and mild traumatic event, I would like to obtain updated xrays for further evaluation of additional fracture. She is to follow up with pain management this week. I will contact her with the results of her xrays.   I spent a total of 20 minutes in both face-to-face and non-face-to-face activities for this visit on the date of this encounter including review of previous imaging and documentation, review of symptoms, physical exam, discussion of POC, documentation, and order placement.  Cooper Render PA-C Neurosurgery

## 2022-05-08 ENCOUNTER — Encounter: Payer: Self-pay | Admitting: Student in an Organized Health Care Education/Training Program

## 2022-05-08 ENCOUNTER — Ambulatory Visit
Payer: Medicare Other | Attending: Student in an Organized Health Care Education/Training Program | Admitting: Student in an Organized Health Care Education/Training Program

## 2022-05-08 VITALS — BP 140/58 | HR 66 | Temp 98.0°F | Resp 17 | Ht 61.0 in | Wt 154.0 lb

## 2022-05-08 DIAGNOSIS — M48062 Spinal stenosis, lumbar region with neurogenic claudication: Secondary | ICD-10-CM | POA: Insufficient documentation

## 2022-05-08 DIAGNOSIS — M4804 Spinal stenosis, thoracic region: Secondary | ICD-10-CM

## 2022-05-08 DIAGNOSIS — G894 Chronic pain syndrome: Secondary | ICD-10-CM | POA: Diagnosis present

## 2022-05-08 DIAGNOSIS — Z9889 Other specified postprocedural states: Secondary | ICD-10-CM | POA: Insufficient documentation

## 2022-05-08 DIAGNOSIS — S32020S Wedge compression fracture of second lumbar vertebra, sequela: Secondary | ICD-10-CM | POA: Diagnosis present

## 2022-05-08 DIAGNOSIS — S22060G Wedge compression fracture of T7-T8 vertebra, subsequent encounter for fracture with delayed healing: Secondary | ICD-10-CM | POA: Insufficient documentation

## 2022-05-08 DIAGNOSIS — Z79891 Long term (current) use of opiate analgesic: Secondary | ICD-10-CM | POA: Insufficient documentation

## 2022-05-08 MED ORDER — OXYCODONE-ACETAMINOPHEN 7.5-325 MG PO TABS: 1.0000 | ORAL_TABLET | Freq: Three times a day (TID) | ORAL | 0 refills | Status: AC | PRN

## 2022-05-08 MED ORDER — OXYCODONE-ACETAMINOPHEN 7.5-325 MG PO TABS: 1.0000 | ORAL_TABLET | Freq: Three times a day (TID) | ORAL | 0 refills | Status: DC | PRN

## 2022-05-08 NOTE — Progress Notes (Signed)
PROVIDER NOTE: Information contained herein reflects review and annotations entered in association with encounter. Interpretation of such information and data should be left to medically-trained personnel. Information provided to patient can be located elsewhere in the medical record under "Patient Instructions". Document created using STT-dictation technology, any transcriptional errors that may result from process are unintentional.    Patient: Laura Cole  Service Category: E/M  Provider: Gillis Santa, MD  DOB: July 02, 1940  DOS: 05/08/2022  Referring Provider: Baxter Hire, MD  MRN: [id number]  Specialty: Interventional Pain Management  PCP: Baxter Hire, MD  Type: Established Patient  Setting: Ambulatory outpatient    Location: Office  Delivery: Face-to-face     HPI  [name], a 81 y.o. year old female, is here today because of her Spinal stenosis, lumbar region, with neurogenic claudication [M48.062]. [name] primary complain today is Back Pain (lower) Last encounter: My last encounter with her was on 03/06/22 Pertinent problems: [name] has Spinal stenosis, lumbar region, with neurogenic claudication; Chronic thoracic back pain; Closed fracture of dorsal (thoracic) vertebra ([place]); Compression fracture of thoracic vertebra with delayed healing; History of kyphoplasty; Spinal stenosis, thoracic; Compression fracture of L2 lumbar vertebra ([place]); Lumbar degenerative disc disease; Encounter for long-term opiate analgesic use; Chronic pain syndrome; and Pain management contract signed on their pertinent problem list. Pain Assessment: Severity of Chronic pain is reported as a 9 /10. Location: Back Lower, Mid, Right/to right flank. Onset: More than a month ago. Quality: Aching. Timing: Constant. Modifying factor(s): sitting, meds. Vitals:  height is _0  (1.549 m) and weight is 154 lb (69.9 kg). Her temporal temperature is 98 F (36.7 C). Her blood pressure is 140/58 (abnormal) and her  pulse is 66. Her respiration is 17 and oxygen saturation is 100%.   Reason for encounter: medication management.  Patient states that she is having more difficulty managing her pain on her current regimen of Percocet 5 mg 3 times daily as needed.  She states that the pain is most pronounced in her mid thoracic spine.  She has a history of multiple compression fractures as well as lumbar spinal stenosis and neurogenic claudication.  She is requesting a slight increase in her Percocet.  This is reasonable.  Will increase to 7.5 mg 3 times daily as needed.  Otherwise, patient continues multimodal pain regimen as prescribed.    Pharmacotherapy Assessment  Analgesic:  Percocet 5-->7.5 mg TID prn    Monitoring: [place] PMP: PDMP reviewed during this encounter.       Pharmacotherapy: No side-effects or adverse reactions reported. Compliance: No problems identified. Effectiveness: Clinically acceptable.  Laura Patience, RN  05/08/2022 10:43 AM  Sign when Signing Visit Nursing Pain Medication Assessment:  Safety precautions to be maintained throughout the outpatient stay will include: orient to surroundings, keep bed in low position, maintain call bell within reach at all times, provide assistance with transfer out of bed and ambulation.  Medication Inspection Compliance: Pill count conducted under aseptic conditions, in front of the patient. Neither the pills nor the bottle was removed from the patient's sight at any time. Once count was completed pills were immediately returned to the patient in their original bottle.  Medication: Oxycodone/APAP Pill/Patch Count:  59 of 90 pills remain Pill/Patch Appearance: Markings consistent with prescribed medication Bottle Appearance: Standard pharmacy container. Clearly labeled. Filled Date: 38 / 05 / 2023 Last Medication intake:  Today    No results found for: "CBDTHCR" No results found for: "D8THCCBX" No results found  for: "D9THCCBX"  UDS:  Summary  Date  Value Ref Range Status  12/26/2021 Note  Final    Comment:    ==================================================================== Compliance Drug Analysis, Ur ==================================================================== Test                             Result       Flag       Units  Drug Present and Declared for Prescription Verification   Oxycodone                      1083         EXPECTED   ng/mg creat   Oxymorphone                    1317         EXPECTED   ng/mg creat   Noroxycodone                   1906         EXPECTED   ng/mg creat   Noroxymorphone                 406          EXPECTED   ng/mg creat    Sources of oxycodone are scheduled prescription medications.    Oxymorphone, noroxycodone, and noroxymorphone are expected    metabolites of oxycodone. Oxymorphone is also available as a    scheduled prescription medication.    Baclofen                       PRESENT      EXPECTED   Acetaminophen                  PRESENT      EXPECTED   Metoprolol                     PRESENT      EXPECTED  Drug Absent but Declared for Prescription Verification   Methocarbamol                  Not Detected UNEXPECTED   Naproxen                       Not Detected UNEXPECTED ==================================================================== Test                      Result    Flag   Units      Ref Range   Creatinine              66               mg/dL      >=20 ==================================================================== Declared Medications:  The flagging and interpretation on this report are based on the  following declared medications.  Unexpected results may arise from  inaccuracies in the declared medications.   **Note: The testing scope of this panel includes these medications:   Baclofen (Lioresal)  Methocarbamol (Robaxin)  Metoprolol (Lopressor)  Naproxen (Naprosyn)  Oxycodone (Percocet)   **Note: The testing scope of this panel does not include small to   moderate amounts of these reported medications:   Acetaminophen (Percocet)   **Note: The testing scope of this panel does not include the  following reported medications:   Alendronate (Fosamax)  Amlodipine (Norvasc)  Ipratropium (Atrovent)  Levothyroxine (Synthroid)  Ondansetron (Zofran)  Pantoprazole (Protonix) ==================================================================== For clinical consultation, please call [phone]. ====================================================================       ROS  Constitutional: Denies any fever or chills Gastrointestinal: No reported hemesis, hematochezia, vomiting, or acute GI distress Musculoskeletal:  mid thoracic and lumbar spine pain Neurological: No reported episodes of acute onset apraxia, aphasia, dysarthria, agnosia, amnesia, paralysis, loss of coordination, or loss of consciousness  Medication Review  amLODipine, cinacalcet, ipratropium, levothyroxine, methocarbamol, metoprolol tartrate, ondansetron, oxyCODONE-acetaminophen, and pantoprazole  History Review  Allergy: Ms. Ellwood is allergic to mushroom extract complex, promethazine, alprazolam, cefdinir, ciprofloxacin, dilaudid [hydromorphone hcl], hydromorphone, phenergan [promethazine hcl], pravastatin, and augmentin [amoxicillin-pot clavulanate]. Drug: Ms. Cabral  reports no history of drug use. Alcohol:  reports that she does not currently use alcohol. Tobacco:  reports that she has never smoked. She has never used smokeless tobacco. Social: Ms. Denman  reports that she has never smoked. She has never used smokeless tobacco. She reports that she does not currently use alcohol. She reports that she does not use drugs. Medical:  has a past medical history of Bradycardia, Cancer ([place]), Coronary artery disease, Diverticulitis, GERD (gastroesophageal reflux disease), Heart murmur, Hemorrhoids, Hypertension, Hypothyroidism, Osteoarthritis, Peripheral vascular disease ([place]),  and TIA (transient ischemic attack). Surgical: Ms. Bennette  has a past surgical history that includes Abdominal hysterectomy; Rectocele repair; Cholecystectomy; Wrist surgery; Replacement total knee (Bilateral); Back surgery; TKRX2 (Bilateral); Eye surgery; Rectocele repair; Colon surgery; Colonoscopy with propofol (N/A, 12/05/2019); Esophagogastroduodenoscopy (egd) with propofol (N/A, 12/05/2019); IR Radiologist Eval & Mgmt (07/30/2021); IR KYPHO LUMBAR INC FX REDUCE BONE BX UNI/BIL CANNULATION INC/IMAGING (08/12/2021); IR Radiologist Eval & Mgmt (09/05/2021); and IR Radiologist Eval & Mgmt (09/24/2021). Family: family history includes Breast cancer (age of onset: 22) in her sister.  Laboratory Chemistry Profile   Renal Lab Results  Component Value Date   BUN 14 08/12/2021   CREATININE 0.91 08/12/2021   GFRAA >60 04/20/2018   GFRNONAA >60 08/12/2021    Hepatic Lab Results  Component Value Date   AST 22 05/25/2021   ALT 15 05/25/2021   ALBUMIN 3.7 05/25/2021   ALKPHOS 72 05/25/2021   LIPASE 68 (H) 04/20/2018    Electrolytes Lab Results  Component Value Date   NA 140 08/12/2021   K 3.7 08/12/2021   CL 106 08/12/2021   CALCIUM 10.3 08/12/2021   MG 2.1 05/26/2021    Bone No results found for: "VD25OH", "VD125OH2TOT", "HE5277OE4", "MP5361WE3", "25OHVITD1", "25OHVITD2", "25OHVITD3", "TESTOFREE", "TESTOSTERONE"  Inflammation (CRP: Acute Phase) (ESR: Chronic Phase) No results found for: "CRP", "ESRSEDRATE", "LATICACIDVEN"       Note: Above Lab results reviewed.  Recent Imaging Review  DG Thoracic Spine 2 View CLINICAL DATA:  Back pain  EXAM: THORACIC SPINE 2 VIEWS  COMPARISON:  01/14/2022  FINDINGS: Scoliosis of the thoracolumbar spine. Treated compression deformities at T8 and L1. Chronic severe compression deformity at T12, no change. Severe compression fracture L2, stable as compared with 01/14/2022.  IMPRESSION: 1. Chronic severe compression fracture at T12. Treated  compression deformities at T8 and L1 2. Severe compression fracture L2 without significant change since 01/14/2022  Electronically Signed   By: Donavan Foil M.D.   On: 05/07/2022 23:23 DG Lumbar Spine 2-3 Views CLINICAL DATA:  Back pain history of fracture  EXAM: LUMBAR SPINE - 2-3 VIEW  COMPARISON:  01/14/2022, CT 11/07/2021  FINDINGS: Scoliosis of the thoracolumbar spine. Treated compression deformity at L1. Severe compression deformity at T12 and L2 without significant change since 01/14/2022. Mild diffuse disc  space narrowing and degenerative change.  IMPRESSION: Treated compression deformity at L1. Severe compression deformities at T12, chronic, and L2 without significant change since 01/14/2022.  Electronically Signed   By: Donavan Foil M.D.   On: 05/07/2022 23:21 Note: Reviewed        Physical Exam  General appearance: Well nourished, well developed, and well hydrated. In no apparent acute distress Mental status: Alert, oriented x 3 (person, place, & time)       Respiratory: No evidence of acute respiratory distress Eyes: PERLA Vitals: BP (!) 140/58   Pulse 66   Temp 98 F (36.7 C) (Temporal)   Resp 17   Ht _0  (1.549 m)   Wt 154 lb (69.9 kg)   LMP  (LMP Unknown)   SpO2 100%   BMI 29.10 kg/m  BMI: Estimated body mass index is 29.1 kg/m as calculated from the following:   Height as of this encounter: _1  (1.549 m).   Weight as of this encounter: 154 lb (69.9 kg). Ideal: Ideal body weight: 47.8 kg (105 lb 6.1 oz) Adjusted ideal body weight: 56.6 kg (124 lb 13.2 oz)  Thoracic Spine Area Exam  Skin & Axial Inspection: Significant thoracic kyphosis Alignment: Asymmetric Functional ROM: Unrestricted ROM Stability: No instability detected Muscle Tone/Strength: Functionally intact. No obvious neuro-muscular anomalies detected. Sensory (Neurological): Musculoskeletal pain pattern Muscle strength & Tone: No palpable anomalies Lumbar Spine Area Exam   Skin & Axial Inspection: Lumbar Scoliosis Alignment: Levoscoliosis Functional ROM: Pain restricted ROM       Stability: No instability detected Muscle Tone/Strength: Functionally intact. No obvious neuro-muscular anomalies detected. Sensory (Neurological): Musculoskeletal pain pattern and neurogenic   Gait & Posture Assessment  Ambulation: Patient ambulates using a walker Gait: Limited. Using assistive device to ambulate Posture: Difficulty standing up straight, due to pain  Lower Extremity Exam      Side: Right lower extremity   Side: Left lower extremity  Stability: No instability observed           Stability: No instability observed          Skin & Extremity Inspection: Skin color, temperature, and hair growth are WNL. No peripheral edema or cyanosis. No masses, redness, swelling, asymmetry, or associated skin lesions. No contractures.   Skin & Extremity Inspection: Skin color, temperature, and hair growth are WNL. No peripheral edema or cyanosis. No masses, redness, swelling, asymmetry, or associated skin lesions. No contractures.  Functional ROM: Pain restricted ROM for hip and knee joints           Functional ROM: Pain restricted ROM for hip and knee joints          Muscle Tone/Strength: Functionally intact. No obvious neuro-muscular anomalies detected.   Muscle Tone/Strength: Functionally intact. No obvious neuro-muscular anomalies detected.  Sensory (Neurological): Neurogenic pain pattern         Sensory (Neurological): Neurogenic pain pattern        DTR: Patellar: deferred today Achilles: deferred today Plantar: deferred today   DTR: Patellar: deferred today Achilles: deferred today Plantar: deferred today  Palpation: No palpable anomalies   Palpation: No palpable anomalies       Assessment   Diagnosis Status  1. Spinal stenosis, lumbar region, with neurogenic claudication   2. Compression fracture of T8 vertebra with delayed healing, subsequent encounter   3. History  of kyphoplasty   4. Compression fracture of L2 vertebra, sequela   5. Spinal stenosis, thoracic   6. Encounter for long-term opiate analgesic  use   7. Chronic pain syndrome     Increased pain Increased pain Controlled     Plan of Care   Ms. Madilynn Montante has a current medication list which includes the following long-term medication(s): amlodipine, ipratropium, levothyroxine, metoprolol tartrate, and pantoprazole.  Pharmacotherapy (Medications Ordered): Meds ordered this encounter  Medications   oxyCODONE-acetaminophen (PERCOCET) 7.5-325 MG tablet    Sig: Take 1 tablet by mouth every 8 (eight) hours as needed for moderate pain or severe pain. Must last 30 days.    Dispense:  90 tablet    Refill:  0    Chronic Pain: STOP Act (Not applicable) Fill 1 day early if closed on refill date. Avoid benzodiazepines within 8 hours of opioids   oxyCODONE-acetaminophen (PERCOCET) 7.5-325 MG tablet    Sig: Take 1 tablet by mouth every 8 (eight) hours as needed for moderate pain or severe pain. Must last 30 days.    Dispense:  90 tablet    Refill:  0    Chronic Pain: STOP Act (Not applicable) Fill 1 day early if closed on refill date. Avoid benzodiazepines within 8 hours of opioids   oxyCODONE-acetaminophen (PERCOCET) 7.5-325 MG tablet    Sig: Take 1 tablet by mouth every 8 (eight) hours as needed for moderate pain or severe pain. Must last 30 days.    Dispense:  90 tablet    Refill:  0    Chronic Pain: STOP Act (Not applicable) Fill 1 day early if closed on refill date. Avoid benzodiazepines within 8 hours of opioids  Continue Robaxin as prescribed, no refills needed.  Follow-up plan:   Return in about 4 months (around 08/26/2022) for Medication Management, in person.    Recent Visits Date Type Provider Dept  03/06/22 Office Visit Gillis Santa, MD [facility]  Showing recent visits within past 90 days and meeting all other requirements Today's Visits Date Type Provider Dept   05/08/22 Office Visit Gillis Santa, MD [facility]  Showing today's visits and meeting all other requirements Future Appointments No visits were found meeting these conditions. Showing future appointments within next 90 days and meeting all other requirements  I discussed the assessment and treatment plan with the patient. The patient was provided an opportunity to ask questions and all were answered. The patient agreed with the plan and demonstrated an understanding of the instructions.  Patient advised to call back or seek an in-person evaluation if the symptoms or condition worsens.  Duration of encounter: 3mnutes.  Total time on encounter, as per AMA guidelines included both the face-to-face and non-face-to-face time personally spent by the physician and/or other qualified health care professional(s) on the day of the encounter (includes time in activities that require the physician or other qualified health care professional and does not include time in activities normally performed by clinical staff). Physician's time may include the following activities when performed: preparing to see the patient (eg, review of tests, pre-charting review of records) obtaining and/or reviewing separately obtained history performing a medically appropriate examination and/or evaluation counseling and educating the patient/family/caregiver ordering medications, tests, or procedures referring and communicating with other health care professionals (when not separately reported) documenting clinical information in the electronic or other health record independently interpreting results (not separately reported) and communicating results to the patient/ family/caregiver care coordination (not separately reported)  Note by: BGillis Santa MD Date: 05/08/2022; Time: 11:13 AM

## 2022-05-08 NOTE — Progress Notes (Signed)
Nursing Pain Medication Assessment:  Safety precautions to be maintained throughout the outpatient stay will include: orient to surroundings, keep bed in low position, maintain call bell within reach at all times, provide assistance with transfer out of bed and ambulation.  Medication Inspection Compliance: Pill count conducted under aseptic conditions, in front of the patient. Neither the pills nor the bottle was removed from the patient's sight at any time. Once count was completed pills were immediately returned to the patient in their original bottle.  Medication: Oxycodone/APAP Pill/Patch Count:  59 of 90 pills remain Pill/Patch Appearance: Markings consistent with prescribed medication Bottle Appearance: Standard pharmacy container. Clearly labeled. Filled Date: 66 / 05 / 2023 Last Medication intake:  Today

## 2022-07-26 ENCOUNTER — Encounter: Payer: Self-pay | Admitting: Student in an Organized Health Care Education/Training Program

## 2022-07-28 ENCOUNTER — Telehealth: Payer: Self-pay | Admitting: Student in an Organized Health Care Education/Training Program

## 2022-07-28 NOTE — Telephone Encounter (Signed)
PT stated that she spoke with [facility] and was told that she doesn't have another prescription for oxycodone. PT stated that she has one pill left. Please give patient a call. TY

## 2022-07-28 NOTE — Telephone Encounter (Signed)
Patient states that they do have her script and she will pick it up today.

## 2022-07-29 ENCOUNTER — Encounter: Payer: Self-pay | Admitting: *Deleted

## 2022-08-04 ENCOUNTER — Ambulatory Visit: Payer: Medicare Other | Admitting: Dermatology

## 2022-08-19 ENCOUNTER — Encounter: Payer: Self-pay | Admitting: Student in an Organized Health Care Education/Training Program

## 2022-08-19 ENCOUNTER — Ambulatory Visit
Payer: Medicare Other | Attending: Student in an Organized Health Care Education/Training Program | Admitting: Student in an Organized Health Care Education/Training Program

## 2022-08-19 VITALS — BP 154/64 | HR 57 | Temp 97.9°F | Resp 16 | Ht 61.25 in | Wt 158.0 lb

## 2022-08-19 DIAGNOSIS — Z79891 Long term (current) use of opiate analgesic: Secondary | ICD-10-CM

## 2022-08-19 DIAGNOSIS — S32020S Wedge compression fracture of second lumbar vertebra, sequela: Secondary | ICD-10-CM

## 2022-08-19 DIAGNOSIS — M48062 Spinal stenosis, lumbar region with neurogenic claudication: Secondary | ICD-10-CM | POA: Insufficient documentation

## 2022-08-19 DIAGNOSIS — M47814 Spondylosis without myelopathy or radiculopathy, thoracic region: Secondary | ICD-10-CM | POA: Insufficient documentation

## 2022-08-19 DIAGNOSIS — M4804 Spinal stenosis, thoracic region: Secondary | ICD-10-CM | POA: Insufficient documentation

## 2022-08-19 DIAGNOSIS — G894 Chronic pain syndrome: Secondary | ICD-10-CM | POA: Diagnosis present

## 2022-08-19 DIAGNOSIS — Z9889 Other specified postprocedural states: Secondary | ICD-10-CM

## 2022-08-19 DIAGNOSIS — S22060G Wedge compression fracture of T7-T8 vertebra, subsequent encounter for fracture with delayed healing: Secondary | ICD-10-CM | POA: Diagnosis present

## 2022-08-19 MED ORDER — OXYCODONE-ACETAMINOPHEN 10-325 MG PO TABS: 1.0000 | ORAL_TABLET | Freq: Three times a day (TID) | ORAL | 0 refills | Status: AC | PRN

## 2022-08-19 MED ORDER — OXYCODONE-ACETAMINOPHEN 10-325 MG PO TABS: 1.0000 | ORAL_TABLET | Freq: Three times a day (TID) | ORAL | 0 refills | Status: DC | PRN

## 2022-08-19 NOTE — Progress Notes (Signed)
PROVIDER NOTE: Information contained herein reflects review and annotations entered in association with encounter. Interpretation of such information and data should be left to medically-trained personnel. Information provided to patient can be located elsewhere in the medical record under "Patient Instructions". Document created using STT-dictation technology, any transcriptional errors that may result from process are unintentional.    Patient: Laura Cole  Service Category: E/M  Provider: Gillis Santa, MD  DOB: 05/25/1940  DOS: 08/19/2022  Referring Provider: Baxter Hire, MD  MRN: [id number]  Specialty: Interventional Pain Management  PCP: Baxter Hire, MD  Type: Established Patient  Setting: Ambulatory outpatient    Location: Office  Delivery: Face-to-face     HPI  Laura Cole, a 82 y.o. 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 [name] primary complain today is Back Pain (Mid to lower bilateral ) Last encounter: My last encounter with her was on 05/08/22 Pertinent problems: Ms. Tarabocchia has Spinal stenosis, lumbar region, with neurogenic claudication; Chronic thoracic back pain; Closed fracture of dorsal (thoracic) vertebra; Compression fracture of thoracic vertebra with delayed healing; History of kyphoplasty; Spinal stenosis, thoracic; Compression fracture of L2 lumbar vertebra; Lumbar degenerative disc disease; Encounter for long-term opiate analgesic use; Chronic pain syndrome; and Pain management contract signed on their pertinent problem list. Pain Assessment: Severity of Chronic pain is reported as a  /10. Location: Back Mid, Lower, Left, Right/sometimes around the right side of her abdomen. Onset: More than a month ago. Quality: Discomfort, Constant, Pressure, Stabbing, Radiating. Timing: Constant. Modifying factor(s): sitting down in recliner and lifting up her torso, going to bed.. Vitals:  height is 5' 1.25" (1.556 m) and weight  is 158 lb (71.7 kg). Her temporal temperature is 97.9 F (36.6 C). Her blood pressure is 154/64 (abnormal) and her pulse is 57 (abnormal). Her respiration is 16 and oxygen saturation is 98%.   Reason for encounter: medication management.  Patient states that she is having difficulty managing her mid thoracic and upper lumbar spine pain.  She endorses significant pain with forward flexion and lateral rotation.  She states that she has also gained about 5 pounds.  She states that while the increase in her Percocet dose from 5 to 7.5 mg was helpful, it is still not providing sustained pain relief.  She is requesting a dose increase to 10 mg.  She does have history of chronic thoracic compression fractures.  We discussed thoracic facet medial branch nerve blocks for her condition which she will think about.  I informed her that we will stay at this dose and I do not recommend any further dose escalation beyond this.  Patient in agreement.   Pharmacotherapy Assessment  Analgesic:  Percocet 5-->7.5 mg-->10 TID prn    Monitoring: [place] PMP: PDMP reviewed during this encounter.       Pharmacotherapy: No side-effects or adverse reactions reported. Compliance: No problems identified. Effectiveness: Clinically acceptable.  Janett Billow, RN  08/19/2022  2:23 PM  Sign when Signing Visit Nursing Pain Medication Assessment:  Safety precautions to be maintained throughout the outpatient stay will include: orient to surroundings, keep bed in low position, maintain call bell within reach at all times, provide assistance with transfer out of bed and ambulation.  Medication Inspection Compliance: Pill count conducted under aseptic conditions, in front of the patient. Neither the pills nor the bottle was removed from the patient's sight at any time. Once count was completed pills were immediately returned to  the patient in their original bottle.  Medication: Oxycodone/APAP Pill/Patch Count:  27 of 90 pills  remain Pill/Patch Appearance: Markings consistent with prescribed medication Bottle Appearance: Standard pharmacy container. Clearly labeled. Filled Date: 03 / 11 / 2024 Last Medication intake:  Today    No results found for: "CBDTHCR" No results found for: "D8THCCBX" No results found for: "D9THCCBX"  UDS:  Summary  Date Value Ref Range Status  12/26/2021 Note  Final    Comment:    ==================================================================== Compliance Drug Analysis, Ur ==================================================================== Test                             Result       Flag       Units  Drug Present and Declared for Prescription Verification   Oxycodone                      1083         EXPECTED   ng/mg creat   Oxymorphone                    1317         EXPECTED   ng/mg creat   Noroxycodone                   1906         EXPECTED   ng/mg creat   Noroxymorphone                 406          EXPECTED   ng/mg creat    Sources of oxycodone are scheduled prescription medications.    Oxymorphone, noroxycodone, and noroxymorphone are expected    metabolites of oxycodone. Oxymorphone is also available as a    scheduled prescription medication.    Baclofen                       PRESENT      EXPECTED   Acetaminophen                  PRESENT      EXPECTED   Metoprolol                     PRESENT      EXPECTED  Drug Absent but Declared for Prescription Verification   Methocarbamol                  Not Detected UNEXPECTED   Naproxen                       Not Detected UNEXPECTED ==================================================================== Test                      Result    Flag   Units      Ref Range   Creatinine              66               mg/dL      >=20 ==================================================================== Declared Medications:  The flagging and interpretation on this report are based on the  following declared medications.  Unexpected  results may arise from  inaccuracies in the declared medications.   **Note: The testing scope of this panel includes these medications:   Baclofen (Lioresal)  Methocarbamol (Robaxin)  Metoprolol (  Lopressor)  Naproxen (Naprosyn)  Oxycodone (Percocet)   **Note: The testing scope of this panel does not include small to  moderate amounts of these reported medications:   Acetaminophen (Percocet)   **Note: The testing scope of this panel does not include the  following reported medications:   Alendronate (Fosamax)  Amlodipine (Norvasc)  Ipratropium (Atrovent)  Levothyroxine (Synthroid)  Ondansetron (Zofran)  Pantoprazole (Protonix) ==================================================================== For clinical consultation, please call [phone]. ====================================================================       ROS  Constitutional: Denies any fever or chills Gastrointestinal: No reported hemesis, hematochezia, vomiting, or acute GI distress Musculoskeletal:  mid thoracic and lumbar spine pain Neurological: No reported episodes of acute onset apraxia, aphasia, dysarthria, agnosia, amnesia, paralysis, loss of coordination, or loss of consciousness  Medication Review  amLODipine, clindamycin, ipratropium, levothyroxine, metoprolol tartrate, ondansetron, oxyCODONE-acetaminophen, and pantoprazole  History Review  Allergy: Ms. San is allergic to mushroom extract complex, promethazine, alprazolam, cefdinir, ciprofloxacin, dilaudid [hydromorphone hcl], hydromorphone, phenergan [promethazine hcl], pravastatin, and augmentin [amoxicillin-pot clavulanate]. Drug: Ms. Connelly  reports no history of drug use. Alcohol:  reports that she does not currently use alcohol. Tobacco:  reports that she has never smoked. She has never used smokeless tobacco. Social: Ms. Kurdziel  reports that she has never smoked. She has never used smokeless tobacco. She reports that she does not  currently use alcohol. She reports that she does not use drugs. Medical:  has a past medical history of Bradycardia, Cancer, Coronary artery disease, Diverticulitis, GERD (gastroesophageal reflux disease), Heart murmur, Hemorrhoids, Hypertension, Hypothyroidism, Osteoarthritis, Peripheral vascular disease, and TIA (transient ischemic attack). Surgical: Ms. Cradle  has a past surgical history that includes Abdominal hysterectomy; Rectocele repair; Cholecystectomy; Wrist surgery; Replacement total knee (Bilateral); Back surgery; TKRX2 (Bilateral); Eye surgery; Rectocele repair; Colon surgery; Colonoscopy with propofol (N/A, 12/05/2019); Esophagogastroduodenoscopy (egd) with propofol (N/A, 12/05/2019); IR Radiologist Eval & Mgmt (07/30/2021); IR KYPHO LUMBAR INC FX REDUCE BONE BX UNI/BIL CANNULATION INC/IMAGING (08/12/2021); IR Radiologist Eval & Mgmt (09/05/2021); and IR Radiologist Eval & Mgmt (09/24/2021). Family: family history includes Breast cancer (age of onset: 59) in her sister.  Laboratory Chemistry Profile   Renal Lab Results  Component Value Date   BUN 14 08/12/2021   CREATININE 0.91 08/12/2021   GFRAA >60 04/20/2018   GFRNONAA >60 08/12/2021    Hepatic Lab Results  Component Value Date   AST 22 05/25/2021   ALT 15 05/25/2021   ALBUMIN 3.7 05/25/2021   ALKPHOS 72 05/25/2021   LIPASE 68 (H) 04/20/2018    Electrolytes Lab Results  Component Value Date   NA 140 08/12/2021   K 3.7 08/12/2021   CL 106 08/12/2021   CALCIUM 10.3 08/12/2021   MG 2.1 05/26/2021    Bone No results found for: "VD25OH", "VD125OH2TOT", "[id number]", "UK:060616", "25OHVITD1", "25OHVITD2", "25OHVITD3", "TESTOFREE", "TESTOSTERONE"  Inflammation (CRP: Acute Phase) (ESR: Chronic Phase) No results found for: "CRP", "ESRSEDRATE", "LATICACIDVEN"       Note: Above Lab results reviewed.  Recent Imaging Review  DG Thoracic Spine 2 View CLINICAL DATA:  Back pain  EXAM: THORACIC SPINE 2 VIEWS  COMPARISON:   01/14/2022  FINDINGS: Scoliosis of the thoracolumbar spine. Treated compression deformities at T8 and L1. Chronic severe compression deformity at T12, no change. Severe compression fracture L2, stable as compared with 01/14/2022.  IMPRESSION: 1. Chronic severe compression fracture at T12. Treated compression deformities at T8 and L1 2. Severe compression fracture L2 without significant change since 01/14/2022  Electronically Signed   By: Madie Reno.D.  On: 05/07/2022 23:23 DG Lumbar Spine 2-3 Views CLINICAL DATA:  Back pain history of fracture  EXAM: LUMBAR SPINE - 2-3 VIEW  COMPARISON:  01/14/2022, CT 11/07/2021  FINDINGS: Scoliosis of the thoracolumbar spine. Treated compression deformity at L1. Severe compression deformity at T12 and L2 without significant change since 01/14/2022. Mild diffuse disc space narrowing and degenerative change.  IMPRESSION: Treated compression deformity at L1. Severe compression deformities at T12, chronic, and L2 without significant change since 01/14/2022.  Electronically Signed   By: Donavan Foil M.D.   On: 05/07/2022 23:21 Note: Reviewed        Physical Exam  General appearance: Well nourished, well developed, and well hydrated. In no apparent acute distress Mental status: Alert, oriented x 3 (person, place, & time)       Respiratory: No evidence of acute respiratory distress Eyes: PERLA Vitals: BP (!) 154/64 (BP Location: Right Arm, Patient Position: Sitting, Cuff Size: Normal)   Pulse (!) 57   Temp 97.9 F (36.6 C) (Temporal)   Resp 16   Ht 5' 1.25" (1.556 m)   Wt 158 lb (71.7 kg)   LMP  (LMP Unknown)   SpO2 98%   BMI 29.61 kg/m  BMI: Estimated body mass index is 29.61 kg/m as calculated from the following:   Height as of this encounter: 5' 1.25" (1.556 m).   Weight as of this encounter: 158 lb (71.7 kg). Ideal: Ideal body weight: 48.4 kg (106 lb 10.4 oz) Adjusted ideal body weight: 57.7 kg (127 lb 3  oz)  Thoracic Spine Area Exam  Skin & Axial Inspection: Significant thoracic kyphosis Alignment: Asymmetric Functional ROM: Unrestricted ROM Stability: No instability detected Muscle Tone/Strength: Functionally intact. No obvious neuro-muscular anomalies detected. Sensory (Neurological): Musculoskeletal pain pattern Muscle strength & Tone: No palpable anomalies Lumbar Spine Area Exam  Skin & Axial Inspection: Lumbar Scoliosis Alignment: Levoscoliosis Functional ROM: Pain restricted ROM       Stability: No instability detected Muscle Tone/Strength: Functionally intact. No obvious neuro-muscular anomalies detected. Sensory (Neurological): Musculoskeletal pain pattern and neurogenic   Gait & Posture Assessment  Ambulation: Patient ambulates using a walker Gait: Limited. Using assistive device to ambulate Posture: Difficulty standing up straight, due to pain  Lower Extremity Exam      Side: Right lower extremity   Side: Left lower extremity  Stability: No instability observed           Stability: No instability observed          Skin & Extremity Inspection: Skin color, temperature, and hair growth are WNL. No peripheral edema or cyanosis. No masses, redness, swelling, asymmetry, or associated skin lesions. No contractures.   Skin & Extremity Inspection: Skin color, temperature, and hair growth are WNL. No peripheral edema or cyanosis. No masses, redness, swelling, asymmetry, or associated skin lesions. No contractures.  Functional ROM: Pain restricted ROM for hip and knee joints           Functional ROM: Pain restricted ROM for hip and knee joints          Muscle Tone/Strength: Functionally intact. No obvious neuro-muscular anomalies detected.   Muscle Tone/Strength: Functionally intact. No obvious neuro-muscular anomalies detected.  Sensory (Neurological): Neurogenic pain pattern         Sensory (Neurological): Neurogenic pain pattern        DTR: Patellar: deferred today Achilles:  deferred today Plantar: deferred today   DTR: Patellar: deferred today Achilles: deferred today Plantar: deferred today  Palpation: No palpable anomalies  Palpation: No palpable anomalies       Assessment   Diagnosis Status  1. Arthropathy of thoracic facet joint   2. Spinal stenosis, lumbar region, with neurogenic claudication   3. Compression fracture of T8 vertebra with delayed healing, subsequent encounter   4. History of kyphoplasty   5. Compression fracture of L2 vertebra, sequela   6. Spinal stenosis, thoracic   7. Encounter for long-term opiate analgesic use   8. Chronic pain syndrome     Increased pain Increased pain Controlled     Plan of Care   Ms. Lynde Auger has a current medication list which includes the following long-term medication(s): amlodipine, ipratropium, levothyroxine, metoprolol tartrate, and pantoprazole.  Pharmacotherapy (Medications Ordered): Meds ordered this encounter  Medications   oxyCODONE-acetaminophen (PERCOCET) 10-325 MG tablet    Sig: Take 1 tablet by mouth every 8 (eight) hours as needed for pain. Must last 30 days.    Dispense:  90 tablet    Refill:  0    Chronic Pain: STOP Act (Not applicable) Fill 1 day early if closed on refill date. Avoid benzodiazepines within 8 hours of opioids   oxyCODONE-acetaminophen (PERCOCET) 10-325 MG tablet    Sig: Take 1 tablet by mouth every 8 (eight) hours as needed for pain. Must last 30 days.    Dispense:  90 tablet    Refill:  0    Chronic Pain: STOP Act (Not applicable) Fill 1 day early if closed on refill date. Avoid benzodiazepines within 8 hours of opioids   oxyCODONE-acetaminophen (PERCOCET) 10-325 MG tablet    Sig: Take 1 tablet by mouth every 8 (eight) hours as needed for pain. Must last 30 days.    Dispense:  90 tablet    Refill:  0    Chronic Pain: STOP Act (Not applicable) Fill 1 day early if closed on refill date. Avoid benzodiazepines within 8 hours of opioids  Continue Robaxin as  prescribed, no refills needed. Consider thoracic facet medial branch nerve blocks  Follow-up plan:   Return in about 14 weeks (around 11/25/2022) for Medication Management, in person.    Recent Visits No visits were found meeting these conditions. Showing recent visits within past 90 days and meeting all other requirements Today's Visits Date Type Provider Dept  08/19/22 Office Visit Gillis Santa, MD [facility]  Showing today's visits and meeting all other requirements Future Appointments No visits were found meeting these conditions. Showing future appointments within next 90 days and meeting all other requirements  I discussed the assessment and treatment plan with the patient. The patient was provided an opportunity to ask questions and all were answered. The patient agreed with the plan and demonstrated an understanding of the instructions.  Patient advised to call back or seek an in-person evaluation if the symptoms or condition worsens.  Duration of encounter: 4minutes.  Total time on encounter, as per AMA guidelines included both the face-to-face and non-face-to-face time personally spent by the physician and/or other qualified health care professional(s) on the day of the encounter (includes time in activities that require the physician or other qualified health care professional and does not include time in activities normally performed by clinical staff). Physician's time may include the following activities when performed: preparing to see the patient (eg, review of tests, pre-charting review of records) obtaining and/or reviewing separately obtained history performing a medically appropriate examination and/or evaluation counseling and educating the patient/family/caregiver ordering medications, tests, or procedures referring and communicating with other health  care professionals (when not separately reported) documenting clinical information in the electronic or  other health record independently interpreting results (not separately reported) and communicating results to the patient/ family/caregiver care coordination (not separately reported)  Note by: Gillis Santa, MD Date: 08/19/2022; Time: 3:38 PM

## 2022-08-19 NOTE — Progress Notes (Signed)
Nursing Pain Medication Assessment:  Safety precautions to be maintained throughout the outpatient stay will include: orient to surroundings, keep bed in low position, maintain call bell within reach at all times, provide assistance with transfer out of bed and ambulation.  Medication Inspection Compliance: Pill count conducted under aseptic conditions, in front of the patient. Neither the pills nor the bottle was removed from the patient's sight at any time. Once count was completed pills were immediately returned to the patient in their original bottle.  Medication: Oxycodone/APAP Pill/Patch Count:  27 of 90 pills remain Pill/Patch Appearance: Markings consistent with prescribed medication Bottle Appearance: Standard pharmacy container. Clearly labeled. Filled Date: 03 / 11 / 2024 Last Medication intake:  Today

## 2022-08-20 ENCOUNTER — Encounter: Payer: Self-pay | Admitting: Student in an Organized Health Care Education/Training Program

## 2022-08-21 ENCOUNTER — Encounter: Payer: Self-pay | Admitting: Student in an Organized Health Care Education/Training Program

## 2022-08-21 ENCOUNTER — Encounter: Payer: Medicare Other | Admitting: Student in an Organized Health Care Education/Training Program

## 2022-08-22 ENCOUNTER — Telehealth: Payer: Self-pay

## 2022-08-22 NOTE — Telephone Encounter (Signed)
Me  to Laura Cole      08/22/22 11:34 AM Dr Cherylann Ratel is not in the office today.  I will send him this message and let you know what he thinks.  He will be back in the office of Monday.    This MyChart message has not been read. Laura Cole  to P [facility] (supporting Edward Jolly, MD)      08/22/22 10:55 AM I know that. right now, I just want to know what he thinks. As if it will be better for me than the 10 mg of oxy.  in other words. Do I need to waste his time by coming in Me  to Laura Cole      08/22/22  8:32 AM Good morning. Dr Cherylann Ratel will not make any opioid changes without an appointment.  You can call and schedule an appointment by calling [phone] if you would like.  Have a great day!.    Last read by Laura Cole at 10:51 AM on 08/22/2022. August 21, 2022 Laura Cole  to [facility] (supporting Edward Jolly, MD)      08/21/22  5:20 PM I have a medicine question.  my 10 mg oxy script I can have filled 08-27-2022.  One of my friends has the same problems thar I do. She alternates between hydrocodone and Tramadol.    I know it is not as strong oxy. Do you think I could try a weel of this before I get the oxy refilled to see how it works for me/   I think i will have pain forever and if this will work, it will not be so habit forming.   I have decided not to have the nerve block because of the side effects. Mostly the pain one   Thanks   Laura Cole This encounter is not signed. The conversation may still be ongoing. Additional Documentation  Vitals: LMP  (LMP Unknown)

## 2022-09-18 ENCOUNTER — Encounter: Payer: Self-pay | Admitting: Student in an Organized Health Care Education/Training Program

## 2022-09-19 ENCOUNTER — Telehealth: Payer: Self-pay | Admitting: Student in an Organized Health Care Education/Training Program

## 2022-09-19 NOTE — Telephone Encounter (Signed)
Patient is having breathing problems with taking the oxycodone. Please call to discuss.

## 2022-09-22 ENCOUNTER — Telehealth: Payer: Self-pay | Admitting: Student in an Organized Health Care Education/Training Program

## 2022-09-22 NOTE — Telephone Encounter (Signed)
Spoke with patient and she states to disregard all of the messages about this because she is doing better and her breathing is fine.  She had a cxray by pcp.  She has an appointment on 5-16 and wishes to just keep that appointment.  Instructed her to call us if anything changes.

## 2022-09-22 NOTE — Telephone Encounter (Signed)
PT stated that she has start taking oxycodone tow times a day. One in the morning and one at night ,PT stated that her and Dr. Cherylann Ratel had discuss about her taking medications twice a day. PT stated that since she has been taking medication in morning and at night, her breathing has got better.Please give patient a call.TY

## 2022-10-02 ENCOUNTER — Ambulatory Visit
Payer: Medicare Other | Attending: Student in an Organized Health Care Education/Training Program | Admitting: Student in an Organized Health Care Education/Training Program

## 2022-10-02 ENCOUNTER — Encounter: Payer: Self-pay | Admitting: Student in an Organized Health Care Education/Training Program

## 2022-10-02 VITALS — BP 183/61 | HR 52 | Temp 97.3°F | Resp 18 | Ht 61.0 in | Wt 151.0 lb

## 2022-10-02 DIAGNOSIS — Z9889 Other specified postprocedural states: Secondary | ICD-10-CM

## 2022-10-02 DIAGNOSIS — S22060G Wedge compression fracture of T7-T8 vertebra, subsequent encounter for fracture with delayed healing: Secondary | ICD-10-CM | POA: Diagnosis not present

## 2022-10-02 DIAGNOSIS — M48062 Spinal stenosis, lumbar region with neurogenic claudication: Secondary | ICD-10-CM

## 2022-10-02 DIAGNOSIS — M5136 Other intervertebral disc degeneration, lumbar region: Secondary | ICD-10-CM | POA: Insufficient documentation

## 2022-10-02 DIAGNOSIS — M47814 Spondylosis without myelopathy or radiculopathy, thoracic region: Secondary | ICD-10-CM | POA: Diagnosis not present

## 2022-10-02 DIAGNOSIS — S32020S Wedge compression fracture of second lumbar vertebra, sequela: Secondary | ICD-10-CM | POA: Diagnosis not present

## 2022-10-02 DIAGNOSIS — M51369 Other intervertebral disc degeneration, lumbar region without mention of lumbar back pain or lower extremity pain: Secondary | ICD-10-CM

## 2022-10-02 MED ORDER — BUPRENORPHINE 5 MCG/HR TD PTWK: 1.0000 | MEDICATED_PATCH | TRANSDERMAL | 0 refills | Status: DC

## 2022-10-02 MED ORDER — BUPRENORPHINE 7.5 MCG/HR TD PTWK: 1.0000 | MEDICATED_PATCH | TRANSDERMAL | 0 refills | Status: DC

## 2022-10-02 NOTE — Progress Notes (Signed)
Nursing Pain Medication Assessment:  Safety precautions to be maintained throughout the outpatient stay will include: orient to surroundings, keep bed in low position, maintain call bell within reach at all times, provide assistance with transfer out of bed and ambulation.  Medication Inspection Compliance: Pill count conducted under aseptic conditions, in front of the patient. Neither the pills nor the bottle was removed from the patient's sight at any time. Once count was completed pills were immediately returned to the patient in their original bottle.  Medication: Oxycodone/APAP Pill/Patch Count:  12 of 90 pills remain Pill/Patch Appearance: Markings consistent with prescribed medication Bottle Appearance: Standard pharmacy container. Clearly labeled. Filled Date: 04 / 10 / 2024 Last Medication intake:  TodaySafety precautions to be maintained throughout the outpatient stay will include: orient to surroundings, keep bed in low position, maintain call bell within reach at all times, provide assistance with transfer out of bed and ambulation.

## 2022-10-02 NOTE — Patient Instructions (Addendum)
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

## 2022-10-02 NOTE — Progress Notes (Signed)
PROVIDER NOTE: Information contained herein reflects review and annotations entered in association with encounter. Interpretation of such information and data should be left to medically-trained personnel. Information provided to patient can be located elsewhere in the medical record under "Patient Instructions". Document created using STT-dictation technology, any transcriptional errors that may result from process are unintentional.    Patient: Laura Cole  Service Category: E/M  Provider: Edward Jolly, MD  DOB: 07/22/1940  DOS: 10/02/2022  Referring Provider: Gracelyn Nurse, MD  MRN: [id number]  Specialty: Interventional Pain Management  PCP: Gracelyn Nurse, MD  Type: Established Patient  Setting: Ambulatory outpatient    Location: Office  Delivery: Face-to-face     HPI  Laura Cole, a 82 y.o. 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 Ms. Russin primary complain today is Back Pain (lower) Last encounter: My last encounter with her was on 08/19/22  Pertinent problems: Ms. Kilbourne has Spinal stenosis, lumbar region, with neurogenic claudication; Chronic thoracic back pain; Closed fracture of dorsal (thoracic) vertebra (HCC); Compression fracture of thoracic vertebra with delayed healing; History of kyphoplasty; Spinal stenosis, thoracic; Compression fracture of L2 lumbar vertebra (HCC); Lumbar degenerative disc disease; Encounter for long-term opiate analgesic use; Chronic pain syndrome; and Pain management contract signed on their pertinent problem list. Pain Assessment: Severity of Chronic pain is reported as a 8 /10. Location: Back Lower/both sides of the abdomen. Onset: More than a month ago. Quality: Radiating, Constant, Stabbing. Timing: Constant. Modifying factor(s): sitting, Oxycodone. Vitals:  height is 5\' 1"  (1.549 m) and weight is 151 lb (68.5 kg). Her temporal temperature is 97.3 F (36.3 C) (abnormal). Her blood pressure is 183/61  (abnormal) and her pulse is 52 (abnormal). Her respiration is 18 and oxygen saturation is 100%.   Reason for encounter: medication management.  At last clinic visit, her Percocet was increased to 10 mg TID which was too much for her. She was noting decrease in oxygenation (has pulse ox at home) and more lethargy and balance issues. Discussed trial of Butrans patch (buprenorphine) which could be more effective with less side effect potential. She still has some Oxycodone 10 mg that she can use for acute breakthrough pain but I instructed her to take 5 mg every 12 hrs as needed only for severe breakthrough pain, I'm hopeful that she'll get better analgesic benefit with Butrans. Discussed kypho orthosis vest for iliocostal impingement syndrome    Pharmacotherapy Assessment  Analgesic:  transition to Butrans patch at 5 mcg/hr    Monitoring: [place] PMP: PDMP reviewed during this encounter.       Pharmacotherapy: No side-effects or adverse reactions reported. Compliance: No problems identified. Effectiveness: Clinically acceptable.  Concepcion Elk, RN  10/02/2022  1:26 PM  Sign when Signing Visit Nursing Pain Medication Assessment:  Safety precautions to be maintained throughout the outpatient stay will include: orient to surroundings, keep bed in low position, maintain call bell within reach at all times, provide assistance with transfer out of bed and ambulation.  Medication Inspection Compliance: Pill count conducted under aseptic conditions, in front of the patient. Neither the pills nor the bottle was removed from the patient's sight at any time. Once count was completed pills were immediately returned to the patient in their original bottle.  Medication: Oxycodone/APAP Pill/Patch Count:  12 of 90 pills remain Pill/Patch Appearance: Markings consistent with prescribed medication Bottle Appearance: Standard pharmacy container. Clearly labeled. Filled Date: 04 / 10 / 2024 Last Medication  intake:  TodaySafety precautions to be maintained throughout the outpatient stay will include: orient to surroundings, keep bed in low position, maintain call bell within reach at all times, provide assistance with transfer out of bed and ambulation.     No results found for: "CBDTHCR" No results found for: "D8THCCBX" No results found for: "D9THCCBX"  UDS:  Summary  Date Value Ref Range Status  12/26/2021 Note  Final    Comment:    ==================================================================== Compliance Drug Analysis, Ur ==================================================================== Test                             Result       Flag       Units  Drug Present and Declared for Prescription Verification   Oxycodone                      1083         EXPECTED   ng/mg creat   Oxymorphone                    1317         EXPECTED   ng/mg creat   Noroxycodone                   1906         EXPECTED   ng/mg creat   Noroxymorphone                 406          EXPECTED   ng/mg creat    Sources of oxycodone are scheduled prescription medications.    Oxymorphone, noroxycodone, and noroxymorphone are expected    metabolites of oxycodone. Oxymorphone is also available as a    scheduled prescription medication.    Baclofen                       PRESENT      EXPECTED   Acetaminophen                  PRESENT      EXPECTED   Metoprolol                     PRESENT      EXPECTED  Drug Absent but Declared for Prescription Verification   Methocarbamol                  Not Detected UNEXPECTED   Naproxen                       Not Detected UNEXPECTED ==================================================================== Test                      Result    Flag   Units      Ref Range   Creatinine              66               mg/dL      >=16 ==================================================================== Declared Medications:  The flagging and interpretation on this report are based on the   following declared medications.  Unexpected results may arise from  inaccuracies in the declared medications.   **Note: The testing scope of this panel includes these medications:   Baclofen (Lioresal)  Methocarbamol (Robaxin)  Metoprolol (Lopressor)  Naproxen (Naprosyn)  Oxycodone (Percocet)   **Note: The testing scope of this panel does not include small to  moderate amounts of these reported medications:   Acetaminophen (Percocet)   **Note: The testing scope of this panel does not include the  following reported medications:   Alendronate (Fosamax)  Amlodipine (Norvasc)  Ipratropium (Atrovent)  Levothyroxine (Synthroid)  Ondansetron (Zofran)  Pantoprazole (Protonix) ==================================================================== For clinical consultation, please call [phone]. ====================================================================       ROS  Constitutional: Denies any fever or chills Gastrointestinal: No reported hemesis, hematochezia, vomiting, or acute GI distress Musculoskeletal:  mid thoracic and lumbar spine pain Neurological: No reported episodes of acute onset apraxia, aphasia, dysarthria, agnosia, amnesia, paralysis, loss of coordination, or loss of consciousness  Medication Review  amLODipine, buprenorphine, clindamycin, hydrocortisone, ipratropium, levothyroxine, metoprolol tartrate, ondansetron, oxyCODONE-acetaminophen, pantoprazole, and polyethylene glycol  History Review  Allergy: Ms. Vittitoe is allergic to mushroom extract complex, promethazine, alprazolam, cefdinir, ciprofloxacin, dilaudid [hydromorphone hcl], hydromorphone, phenergan [promethazine hcl], pravastatin, and augmentin [amoxicillin-pot clavulanate]. Drug: Ms. Moesch  reports no history of drug use. Alcohol:  reports that she does not currently use alcohol. Tobacco:  reports that she has never smoked. She has never used smokeless tobacco. Social: Ms. Peraino  reports that  she has never smoked. She has never used smokeless tobacco. She reports that she does not currently use alcohol. She reports that she does not use drugs. Medical:  has a past medical history of Bradycardia, Cancer (HCC), Coronary artery disease, Diverticulitis, GERD (gastroesophageal reflux disease), Heart murmur, Hemorrhoids, Hypertension, Hypothyroidism, Osteoarthritis, Peripheral vascular disease (HCC), and TIA (transient ischemic attack). Surgical: Ms. Joens  has a past surgical history that includes Abdominal hysterectomy; Rectocele repair; Cholecystectomy; Wrist surgery; Replacement total knee (Bilateral); Back surgery; TKRX2 (Bilateral); Eye surgery; Rectocele repair; Colon surgery; Colonoscopy with propofol (N/A, 12/05/2019); Esophagogastroduodenoscopy (egd) with propofol (N/A, 12/05/2019); IR Radiologist Eval & Mgmt (07/30/2021); IR KYPHO LUMBAR INC FX REDUCE BONE BX UNI/BIL CANNULATION INC/IMAGING (08/12/2021); IR Radiologist Eval & Mgmt (09/05/2021); and IR Radiologist Eval & Mgmt (09/24/2021). Family: family history includes Breast cancer (age of onset: 30) in her sister.  Laboratory Chemistry Profile   Renal Lab Results  Component Value Date   BUN 14 08/12/2021   CREATININE 0.91 08/12/2021   GFRAA >60 04/20/2018   GFRNONAA >60 08/12/2021    Hepatic Lab Results  Component Value Date   AST 22 05/25/2021   ALT 15 05/25/2021   ALBUMIN 3.7 05/25/2021   ALKPHOS 72 05/25/2021   LIPASE 68 (H) 04/20/2018    Electrolytes Lab Results  Component Value Date   NA 140 08/12/2021   K 3.7 08/12/2021   CL 106 08/12/2021   CALCIUM 10.3 08/12/2021   MG 2.1 05/26/2021    Bone No results found for: "VD25OH", "VD125OH2TOT", "UJ8119JY7", "WG9562ZH0", "25OHVITD1", "25OHVITD2", "25OHVITD3", "TESTOFREE", "TESTOSTERONE"  Inflammation (CRP: Acute Phase) (ESR: Chronic Phase) No results found for: "CRP", "ESRSEDRATE", "LATICACIDVEN"       Note: Above Lab results reviewed.  Recent Imaging Review  DG  Thoracic Spine 2 View CLINICAL DATA:  Back pain  EXAM: THORACIC SPINE 2 VIEWS  COMPARISON:  01/14/2022  FINDINGS: Scoliosis of the thoracolumbar spine. Treated compression deformities at T8 and L1. Chronic severe compression deformity at T12, no change. Severe compression fracture L2, stable as compared with 01/14/2022.  IMPRESSION: 1. Chronic severe compression fracture at T12. Treated compression deformities at T8 and L1 2. Severe compression fracture L2 without significant change since 01/14/2022  Electronically Signed   By: Adrian Prows.D.  On: 05/07/2022 23:23 DG Lumbar Spine 2-3 Views CLINICAL DATA:  Back pain history of fracture  EXAM: LUMBAR SPINE - 2-3 VIEW  COMPARISON:  01/14/2022, CT 11/07/2021  FINDINGS: Scoliosis of the thoracolumbar spine. Treated compression deformity at L1. Severe compression deformity at T12 and L2 without significant change since 01/14/2022. Mild diffuse disc space narrowing and degenerative change.  IMPRESSION: Treated compression deformity at L1. Severe compression deformities at T12, chronic, and L2 without significant change since 01/14/2022.  Electronically Signed   By: Jasmine Pang M.D.   On: 05/07/2022 23:21 Note: Reviewed        Physical Exam  General appearance: Well nourished, well developed, and well hydrated. In no apparent acute distress Mental status: Alert, oriented x 3 (person, place, & time)       Respiratory: No evidence of acute respiratory distress Eyes: PERLA Vitals: BP (!) 183/61   Pulse (!) 52   Temp (!) 97.3 F (36.3 C) (Temporal)   Resp 18   Ht 5\' 1"  (1.549 m)   Wt 151 lb (68.5 kg)   LMP  (LMP Unknown)   SpO2 100%   BMI 28.53 kg/m  BMI: Estimated body mass index is 28.53 kg/m as calculated from the following:   Height as of this encounter: 5\' 1"  (1.549 m).   Weight as of this encounter: 151 lb (68.5 kg). Ideal: Ideal body weight: 47.8 kg (105 lb 6.1 oz) Adjusted ideal body weight: 56.1  kg (123 lb 10 oz)  Thoracic Spine Area Exam  Skin & Axial Inspection: Significant thoracic kyphosis Alignment: Asymmetric Functional ROM: Unrestricted ROM Stability: No instability detected Muscle Tone/Strength: Functionally intact. No obvious neuro-muscular anomalies detected. Sensory (Neurological): Musculoskeletal pain pattern Muscle strength & Tone: No palpable anomalies Lumbar Spine Area Exam  Skin & Axial Inspection: Lumbar Scoliosis Alignment: Levoscoliosis Functional ROM: Pain restricted ROM       Stability: No instability detected Muscle Tone/Strength: Functionally intact. No obvious neuro-muscular anomalies detected. Sensory (Neurological): Musculoskeletal pain pattern and neurogenic   Gait & Posture Assessment  Ambulation: Patient ambulates using a walker Gait: Limited. Using assistive device to ambulate Posture: Difficulty standing up straight, due to pain  Lower Extremity Exam      Side: Right lower extremity   Side: Left lower extremity  Stability: No instability observed           Stability: No instability observed          Skin & Extremity Inspection: Skin color, temperature, and hair growth are WNL. No peripheral edema or cyanosis. No masses, redness, swelling, asymmetry, or associated skin lesions. No contractures.   Skin & Extremity Inspection: Skin color, temperature, and hair growth are WNL. No peripheral edema or cyanosis. No masses, redness, swelling, asymmetry, or associated skin lesions. No contractures.  Functional ROM: Pain restricted ROM for hip and knee joints           Functional ROM: Pain restricted ROM for hip and knee joints          Muscle Tone/Strength: Functionally intact. No obvious neuro-muscular anomalies detected.   Muscle Tone/Strength: Functionally intact. No obvious neuro-muscular anomalies detected.  Sensory (Neurological): Neurogenic pain pattern         Sensory (Neurological): Neurogenic pain pattern        DTR: Patellar: deferred  today Achilles: deferred today Plantar: deferred today   DTR: Patellar: deferred today Achilles: deferred today Plantar: deferred today  Palpation: No palpable anomalies   Palpation: No palpable anomalies  Assessment   Diagnosis Status  1. Arthropathy of thoracic facet joint   2. Spinal stenosis, lumbar region, with neurogenic claudication   3. Compression fracture of T8 vertebra with delayed healing, subsequent encounter   4. History of kyphoplasty   5. Compression fracture of L2 vertebra, sequela   6. Lumbar degenerative disc disease      Increased pain Increased pain Controlled     Plan of Care   Ms. Tamikia Siebert has a current medication list which includes the following long-term medication(s): amlodipine, ipratropium, levothyroxine, metoprolol tartrate, and pantoprazole.  Pharmacotherapy (Medications Ordered): Meds ordered this encounter  Medications   buprenorphine (BUTRANS) 5 MCG/HR PTWK    Sig: Place 1 patch onto the skin once a week for 28 days.    Dispense:  4 patch    Refill:  0    Chronic Pain: STOP Act (Not applicable) Fill 1 day early if closed on refill date. Avoid benzodiazepines within 8 hours of opioids   buprenorphine (BUTRANS) 7.5 MCG/HR    Sig: Place 1 patch onto the skin once a week for 28 days.    Dispense:  4 patch    Refill:  0    Chronic Pain: STOP Act (Not applicable) Fill 1 day early if closed on refill date. Avoid benzodiazepines within 8 hours of opioids  Consider weighted kypho orthosis vest for iliocostal impingement syndrome Consider thoracic facet medial branch nerve blocks  Follow-up plan:   Return for Keep sch. appt.    Recent Visits Date Type Provider Dept  08/19/22 Office Visit Edward Jolly, MD [facility]  Showing recent visits within past 90 days and meeting all other requirements Today's Visits Date Type Provider Dept  10/02/22 Office Visit Edward Jolly, MD [facility]  Showing today's visits  and meeting all other requirements Future Appointments Date Type Provider Dept  11/18/22 Appointment Edward Jolly, MD [facility]  Showing future appointments within next 90 days and meeting all other requirements  I discussed the assessment and treatment plan with the patient. The patient was provided an opportunity to ask questions and all were answered. The patient agreed with the plan and demonstrated an understanding of the instructions.  Patient advised to call back or seek an in-person evaluation if the symptoms or condition worsens.  Duration of encounter: .  Total time on encounter, as per AMA guidelines included both the face-to-face and non-face-to-face time personally spent by the physician and/or other qualified health care professional(s) on the day of the encounter (includes time in activities that require the physician or other qualified health care professional and does not include time in activities normally performed by clinical staff). Physician's time may include the following activities when performed: preparing to see the patient (eg, review of tests, pre-charting review of records) obtaining and/or reviewing separately obtained history performing a medically appropriate examination and/or evaluation counseling and educating the patient/family/caregiver ordering medications, tests, or procedures referring and communicating with other health care professionals (when not separately reported) documenting clinical information in the electronic or other health record independently interpreting results (not separately reported) and communicating results to the patient/ family/caregiver care coordination (not separately reported)  Note by: Edward Jolly, MD Date: 10/02/2022; Time: 1:55 PM

## 2022-10-04 ENCOUNTER — Encounter: Payer: Self-pay | Admitting: Student in an Organized Health Care Education/Training Program

## 2022-10-05 ENCOUNTER — Encounter: Payer: Self-pay | Admitting: Student in an Organized Health Care Education/Training Program

## 2022-10-07 ENCOUNTER — Telehealth: Payer: Self-pay | Admitting: Student in an Organized Health Care Education/Training Program

## 2022-10-07 NOTE — Telephone Encounter (Signed)
PT stated that she has come questions and concerns about the patch that were prescribed for her to use. Please give patient a call.TY

## 2022-10-07 NOTE — Telephone Encounter (Signed)
Questioned her medication dosage because the patch is placed onto a fold in the skin, and is not in complete contact. I instructed her that in order to receive the optimal dose, the patch may need to be in complete contact with the skin. Also instructed her to call pharmacy to ask this question.

## 2022-10-09 ENCOUNTER — Telehealth: Payer: Self-pay | Admitting: Student in an Organized Health Care Education/Training Program

## 2022-10-09 NOTE — Telephone Encounter (Signed)
Patient states she got real dizzy after putting on patch and states she is going to quit using it. Informed her that she can always talk with her  PCP which she said that she is going to make an appointment. If bad enough to go the ED. Patient transferred to front to make an appt to discuss this issue with MD

## 2022-10-09 NOTE — Telephone Encounter (Signed)
PT stated that she isn't sure if the patches is causing her to be dizzy, So patient wants to know if she can stop using the patch. Take Oxycodone instead, PT mention she will only be taking half of the pill. Please give patient a call. TY

## 2022-10-10 ENCOUNTER — Encounter: Payer: Self-pay | Admitting: Student in an Organized Health Care Education/Training Program

## 2022-10-15 ENCOUNTER — Encounter: Payer: Self-pay | Admitting: Student in an Organized Health Care Education/Training Program

## 2022-10-21 ENCOUNTER — Telehealth: Payer: Self-pay

## 2022-10-21 ENCOUNTER — Ambulatory Visit
Payer: Medicare Other | Attending: Student in an Organized Health Care Education/Training Program | Admitting: Student in an Organized Health Care Education/Training Program

## 2022-10-21 ENCOUNTER — Encounter: Payer: Self-pay | Admitting: Student in an Organized Health Care Education/Training Program

## 2022-10-21 VITALS — BP 127/70 | HR 77 | Temp 98.2°F | Ht 61.0 in | Wt 144.0 lb

## 2022-10-21 DIAGNOSIS — S22060G Wedge compression fracture of T7-T8 vertebra, subsequent encounter for fracture with delayed healing: Secondary | ICD-10-CM | POA: Diagnosis present

## 2022-10-21 DIAGNOSIS — M4804 Spinal stenosis, thoracic region: Secondary | ICD-10-CM

## 2022-10-21 DIAGNOSIS — G894 Chronic pain syndrome: Secondary | ICD-10-CM

## 2022-10-21 DIAGNOSIS — Z9889 Other specified postprocedural states: Secondary | ICD-10-CM

## 2022-10-21 DIAGNOSIS — M47814 Spondylosis without myelopathy or radiculopathy, thoracic region: Secondary | ICD-10-CM | POA: Diagnosis not present

## 2022-10-21 DIAGNOSIS — M48062 Spinal stenosis, lumbar region with neurogenic claudication: Secondary | ICD-10-CM

## 2022-10-21 DIAGNOSIS — S32020S Wedge compression fracture of second lumbar vertebra, sequela: Secondary | ICD-10-CM | POA: Insufficient documentation

## 2022-10-21 DIAGNOSIS — M5136 Other intervertebral disc degeneration, lumbar region: Secondary | ICD-10-CM | POA: Diagnosis not present

## 2022-10-21 MED ORDER — OXYCODONE-ACETAMINOPHEN 5-325 MG PO TABS: 1.0000 | ORAL_TABLET | Freq: Three times a day (TID) | ORAL | 0 refills | Status: AC | PRN

## 2022-10-21 NOTE — Progress Notes (Signed)
Safety precautions to be maintained throughout the outpatient stay will include: orient to surroundings, keep bed in low position, maintain call bell within reach at all times, provide assistance with transfer out of bed and ambulation.  

## 2022-10-21 NOTE — Progress Notes (Signed)
Pt pharmacy was called and her Oxycodone/Acetaminophen 10/325 mg was cancelled per Dr. Cherylann Ratel. New order for oxycodone/Acetaminophen 5/325 was received at her pharmacy.

## 2022-10-21 NOTE — Progress Notes (Signed)
PROVIDER NOTE: Information contained herein reflects review and annotations entered in association with encounter. Interpretation of such information and data should be left to medically-trained personnel. Information provided to patient can be located elsewhere in the medical record under "Patient Instructions". Document created using STT-dictation technology, any transcriptional errors that may result from process are unintentional.    Patient: Laura Cole  Service Category: E/M  Provider: Edward Jolly, MD  DOB: Jul 17, 1940  DOS: 10/21/2022  Referring Provider: Gracelyn Nurse, MD  MRN: [id number]  Specialty: Interventional Pain Management  PCP: Gracelyn Nurse, MD  Type: Established Patient  Setting: Ambulatory outpatient    Location: Office  Delivery: Face-to-face     HPI  Laura Cole, a 82 y.o. 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 Laura Cole primary complain today is Hip Pain (right)  Pertinent problems: Laura Cole has Spinal stenosis, lumbar region, with neurogenic claudication; Chronic thoracic back pain; Closed fracture of dorsal (thoracic) vertebra (HCC); Compression fracture of thoracic vertebra with delayed healing; History of kyphoplasty; Spinal stenosis, thoracic; Compression fracture of L2 lumbar vertebra (HCC); Lumbar degenerative disc disease; Encounter for long-term opiate analgesic use; Chronic pain syndrome; and Pain management contract signed on their pertinent problem list. Pain Assessment: Severity of Chronic pain is reported as a 9 /10. Location: Hip Right/denies. Onset: More than a month ago. Quality: Constant, Stabbing. Timing: Constant. Modifying factor(s): laying down, meds. Vitals:  height is 5\' 1"  (1.549 m) and weight is 144 lb (65.3 kg). Her temporal temperature is 98.2 F (36.8 C). Her blood pressure is 127/70 and her Cole is 77. Her oxygen saturation is 100%.  BMI: Estimated body mass index is 27.21 kg/m as  calculated from the following:   Height as of this encounter: 5\' 1"  (1.549 m).   Weight as of this encounter: 144 lb (65.3 kg). Last encounter: 10/02/2022. Last procedure: Visit date not found.  Reason for encounter: patient-requested evaluation.   Patient states that she fell last week.  She states that the [facility] patch is not helpful and that she would like to return to oxycodone at 5 mg every 8 hours as needed.  Higher doses of oxycodone resulted in sedation and cognitive changes.  It is unfortunate that she did not see analgesic benefit with buprenorphine.  Pharmacotherapy Assessment  Analgesic: Oxycodone 5 mg every 8 hours as needed     Monitoring: [place] PMP: PDMP reviewed during this encounter.       Pharmacotherapy: No side-effects or adverse reactions reported. Compliance: No problems identified. Effectiveness: Clinically acceptable.  Laura Pulse, RN  10/21/2022 11:56 AM  Sign when Signing Visit Pt pharmacy was called and her Oxycodone/Acetaminophen 10/325 mg was cancelled per Dr. Cherylann Ratel. New order for oxycodone/Acetaminophen 5/325 was received at her pharmacy.  Florina Ou, RN  10/21/2022 11:24 AM  Sign when Signing Visit Safety precautions to be maintained throughout the outpatient stay will include: orient to surroundings, keep bed in low position, maintain call bell within reach at all times, provide assistance with transfer out of bed and ambulation.     No results found for: "CBDTHCR" No results found for: "D8THCCBX" No results found for: "D9THCCBX"  UDS:  Summary  Date Value Ref Range Status  12/26/2021 Note  Final    Comment:    ==================================================================== Compliance Drug Analysis, Ur ==================================================================== Test  Result       Flag       Units  Drug Present and Declared for Prescription Verification   Oxycodone                      1083          EXPECTED   ng/mg creat   Oxymorphone                    1317         EXPECTED   ng/mg creat   Noroxycodone                   1906         EXPECTED   ng/mg creat   Noroxymorphone                 406          EXPECTED   ng/mg creat    Sources of oxycodone are scheduled prescription medications.    Oxymorphone, noroxycodone, and noroxymorphone are expected    metabolites of oxycodone. Oxymorphone is also available as a    scheduled prescription medication.    Baclofen                       PRESENT      EXPECTED   Acetaminophen                  PRESENT      EXPECTED   Metoprolol                     PRESENT      EXPECTED  Drug Absent but Declared for Prescription Verification   Methocarbamol                  Not Detected UNEXPECTED   Naproxen                       Not Detected UNEXPECTED ==================================================================== Test                      Result    Flag   Units      Ref Range   Creatinine              66               mg/dL      >=69 ==================================================================== Declared Medications:  The flagging and interpretation on this report are based on the  following declared medications.  Unexpected results may arise from  inaccuracies in the declared medications.   **Note: The testing scope of this panel includes these medications:   Baclofen (Lioresal)  Methocarbamol (Robaxin)  Metoprolol (Lopressor)  Naproxen (Naprosyn)  Oxycodone (Percocet)   **Note: The testing scope of this panel does not include small to  moderate amounts of these reported medications:   Acetaminophen (Percocet)   **Note: The testing scope of this panel does not include the  following reported medications:   Alendronate (Fosamax)  Amlodipine (Norvasc)  Ipratropium (Atrovent)  Levothyroxine (Synthroid)  Ondansetron (Zofran)  Pantoprazole (Protonix) ==================================================================== For  clinical consultation, please call [phone]. ====================================================================       ROS  Constitutional: Denies any fever or chills Gastrointestinal: No reported hemesis, hematochezia, vomiting, or acute GI distress Musculoskeletal:  Low back pain Neurological: No reported episodes of acute onset apraxia, aphasia, dysarthria, agnosia, amnesia,  paralysis, loss of coordination, or loss of consciousness  Medication Review  amLODipine, levothyroxine, metoprolol tartrate, ondansetron, oxyCODONE-acetaminophen, pantoprazole, and polyethylene glycol  History Review  Allergy: Laura Cole is allergic to mushroom extract complex, promethazine, alprazolam, cefdinir, ciprofloxacin, dilaudid [hydromorphone hcl], hydromorphone, phenergan [promethazine hcl], pravastatin, and augmentin [amoxicillin-pot clavulanate]. Drug: Laura Cole  reports no history of drug use. Alcohol:  reports that she does not currently use alcohol. Tobacco:  reports that she has never smoked. She has never used smokeless tobacco. Social: Laura Cole  reports that she has never smoked. She has never used smokeless tobacco. She reports that she does not currently use alcohol. She reports that she does not use drugs. Medical:  has a past medical history of Bradycardia, Cancer (HCC), Coronary artery disease, Diverticulitis, GERD (gastroesophageal reflux disease), Heart murmur, Hemorrhoids, Hypertension, Hypothyroidism, Osteoarthritis, Peripheral vascular disease (HCC), and TIA (transient ischemic attack). Surgical: Laura Cole  has a past surgical history that includes Abdominal hysterectomy; Rectocele repair; Cholecystectomy; Wrist surgery; Replacement total knee (Bilateral); Back surgery; TKRX2 (Bilateral); Eye surgery; Rectocele repair; Colon surgery; Colonoscopy with propofol (N/A, 12/05/2019); Esophagogastroduodenoscopy (egd) with propofol (N/A, 12/05/2019); IR Radiologist Eval & Mgmt (07/30/2021); IR  KYPHO LUMBAR INC FX REDUCE BONE BX UNI/BIL CANNULATION INC/IMAGING (08/12/2021); IR Radiologist Eval & Mgmt (09/05/2021); and IR Radiologist Eval & Mgmt (09/24/2021). Family: family history includes Breast cancer (age of onset: 75) in her sister.  Laboratory Chemistry Profile   Renal Lab Results  Component Value Date   BUN 14 08/12/2021   CREATININE 0.91 08/12/2021   GFRAA >60 04/20/2018   GFRNONAA >60 08/12/2021    Hepatic Lab Results  Component Value Date   AST 22 05/25/2021   ALT 15 05/25/2021   ALBUMIN 3.7 05/25/2021   ALKPHOS 72 05/25/2021   LIPASE 68 (H) 04/20/2018    Electrolytes Lab Results  Component Value Date   NA 140 08/12/2021   K 3.7 08/12/2021   CL 106 08/12/2021   CALCIUM 10.3 08/12/2021   MG 2.1 05/26/2021    Bone No results found for: "VD25OH", "VD125OH2TOT", "JX9147WG9", "FA2130QM5", "25OHVITD1", "25OHVITD2", "25OHVITD3", "TESTOFREE", "TESTOSTERONE"  Inflammation (CRP: Acute Phase) (ESR: Chronic Phase) No results found for: "CRP", "ESRSEDRATE", "LATICACIDVEN"       Note: Above Lab results reviewed.  Recent Imaging Review  DG Thoracic Spine 2 View CLINICAL DATA:  Back pain  EXAM: THORACIC SPINE 2 VIEWS  COMPARISON:  01/14/2022  FINDINGS: Scoliosis of the thoracolumbar spine. Treated compression deformities at T8 and L1. Chronic severe compression deformity at T12, no change. Severe compression fracture L2, stable as compared with 01/14/2022.  IMPRESSION: 1. Chronic severe compression fracture at T12. Treated compression deformities at T8 and L1 2. Severe compression fracture L2 without significant change since 01/14/2022  Electronically Signed   By: Jasmine Pang M.D.   On: 05/07/2022 23:23 DG Lumbar Spine 2-3 Views CLINICAL DATA:  Back pain history of fracture  EXAM: LUMBAR SPINE - 2-3 VIEW  COMPARISON:  01/14/2022, CT 11/07/2021  FINDINGS: Scoliosis of the thoracolumbar spine. Treated compression deformity at L1. Severe  compression deformity at T12 and L2 without significant change since 01/14/2022. Mild diffuse disc space narrowing and degenerative change.  IMPRESSION: Treated compression deformity at L1. Severe compression deformities at T12, chronic, and L2 without significant change since 01/14/2022.  Electronically Signed   By: Jasmine Pang M.D.   On: 05/07/2022 23:21 Note: Reviewed        Physical Exam  General appearance: Well nourished, well developed, and well hydrated. In  no apparent acute distress Mental status: Alert, oriented x 3 (person, place, & time)       Respiratory: No evidence of acute respiratory distress Eyes: PERLA Vitals: BP 127/70   Cole 77   Temp 98.2 F (36.8 C) (Temporal)   Ht 5\' 1"  (1.549 m)   Wt 144 lb (65.3 kg)   LMP  (LMP Unknown)   SpO2 100%   BMI 27.21 kg/m  BMI: Estimated body mass index is 27.21 kg/m as calculated from the following:   Height as of this encounter: 5\' 1"  (1.549 m).   Weight as of this encounter: 144 lb (65.3 kg). Ideal: Ideal body weight: 47.8 kg (105 lb 6.1 oz) Adjusted ideal body weight: 54.8 kg (120 lb 13.2 oz)  Thoracic Spine Area Exam  Skin & Axial Inspection: Significant thoracic kyphosis Alignment: Asymmetric Functional ROM: Unrestricted ROM Stability: No instability detected Muscle Tone/Strength: Functionally intact. No obvious neuro-muscular anomalies detected. Sensory (Neurological): Musculoskeletal pain pattern Muscle strength & Tone: No palpable anomalies Lumbar Spine Area Exam  Skin & Axial Inspection: Lumbar Scoliosis Alignment: Levoscoliosis Functional ROM: Pain restricted ROM       Stability: No instability detected Muscle Tone/Strength: Functionally intact. No obvious neuro-muscular anomalies detected. Sensory (Neurological): Musculoskeletal pain pattern and neurogenic   Gait & Posture Assessment  Ambulation: Patient ambulates using a walker Gait: Limited. Using assistive device to ambulate Posture:  Difficulty standing up straight, due to pain  Lower Extremity Exam      Side: Right lower extremity   Side: Left lower extremity  Stability: No instability observed           Stability: No instability observed          Skin & Extremity Inspection: Skin color, temperature, and hair growth are WNL. No peripheral edema or cyanosis. No masses, redness, swelling, asymmetry, or associated skin lesions. No contractures.   Skin & Extremity Inspection: Skin color, temperature, and hair growth are WNL. No peripheral edema or cyanosis. No masses, redness, swelling, asymmetry, or associated skin lesions. No contractures.  Functional ROM: Pain restricted ROM for hip and knee joints           Functional ROM: Pain restricted ROM for hip and knee joints          Muscle Tone/Strength: Functionally intact. No obvious neuro-muscular anomalies detected.   Muscle Tone/Strength: Functionally intact. No obvious neuro-muscular anomalies detected.  Sensory (Neurological): Neurogenic pain pattern         Sensory (Neurological): Neurogenic pain pattern        DTR: Patellar: deferred today Achilles: deferred today Plantar: deferred today   DTR: Patellar: deferred today Achilles: deferred today Plantar: deferred today  Palpation: No palpable anomalies   Palpation: No palpable anomalies     Assessment   Diagnosis Status  1. Arthropathy of thoracic facet joint   2. Spinal stenosis, lumbar region, with neurogenic claudication   3. Compression fracture of T8 vertebra with delayed healing, subsequent encounter   4. History of kyphoplasty   5. Compression fracture of L2 vertebra, sequela   6. Lumbar degenerative disc disease   7. Spinal stenosis, thoracic   8. Chronic pain syndrome    Controlled Controlled Controlled    Plan of Care   Laura Cole has a current medication list which includes the following long-term medication(s): amlodipine, levothyroxine, metoprolol tartrate, and  pantoprazole.  Pharmacotherapy (Medications Ordered): Meds ordered this encounter  Medications   oxyCODONE-acetaminophen (PERCOCET) 5-325 MG tablet    Sig: Take  1 tablet by mouth every 8 (eight) hours as needed for severe pain. Must last 30 days.    Dispense:  90 tablet    Refill:  0    Chronic Pain: STOP Act (Not applicable) Fill 1 day early if closed on refill date. Avoid benzodiazepines within 8 hours of opioids   oxyCODONE-acetaminophen (PERCOCET) 5-325 MG tablet    Sig: Take 1 tablet by mouth every 8 (eight) hours as needed for severe pain. Must last 30 days.    Dispense:  90 tablet    Refill:  0    Chronic Pain: STOP Act (Not applicable) Fill 1 day early if closed on refill date. Avoid benzodiazepines within 8 hours of opioids   oxyCODONE-acetaminophen (PERCOCET) 5-325 MG tablet    Sig: Take 1 tablet by mouth every 8 (eight) hours as needed for severe pain. Must last 30 days.    Dispense:  90 tablet    Refill:  0    Chronic Pain: STOP Act (Not applicable) Fill 1 day early if closed on refill date. Avoid benzodiazepines within 8 hours of opioids   Orders:  No orders of the defined types were placed in this encounter.  Follow-up plan:   Return in about 3 months (around 01/21/2023) for Medication Management, in person.      Recent Visits Date Type Provider Dept  10/02/22 Office Visit Edward Jolly, MD [facility]  08/19/22 Office Visit Edward Jolly, MD [facility]  Showing recent visits within past 90 days and meeting all other requirements Today's Visits Date Type Provider Dept  10/21/22 Office Visit Edward Jolly, MD [facility]  Showing today's visits and meeting all other requirements Future Appointments No visits were found meeting these conditions. Showing future appointments within next 90 days and meeting all other requirements  I discussed the assessment and treatment plan with the patient. The patient was provided an opportunity to  ask questions and all were answered. The patient agreed with the plan and demonstrated an understanding of the instructions.  Patient advised to call back or seek an in-person evaluation if the symptoms or condition worsens.  Duration of encounter: .  Total time on encounter, as per AMA guidelines included both the face-to-face and non-face-to-face time personally spent by the physician and/or other qualified health care professional(s) on the day of the encounter (includes time in activities that require the physician or other qualified health care professional and does not include time in activities normally performed by clinical staff). Physician's time may include the following activities when performed: Preparing to see the patient (e.g., pre-charting review of records, searching for previously ordered imaging, lab work, and nerve conduction tests) Review of prior analgesic pharmacotherapies. Reviewing PMP Interpreting ordered tests (e.g., lab work, imaging, nerve conduction tests) Performing post-procedure evaluations, including interpretation of diagnostic procedures Obtaining and/or reviewing separately obtained history Performing a medically appropriate examination and/or evaluation Counseling and educating the patient/family/caregiver Ordering medications, tests, or procedures Referring and communicating with other health care professionals (when not separately reported) Documenting clinical information in the electronic or other health record Independently interpreting results (not separately reported) and communicating results to the patient/ family/caregiver Care coordination (not separately reported)  Note by: Edward Jolly, MD Date: 10/21/2022; Time: 12:00 PM

## 2022-10-23 ENCOUNTER — Ambulatory Visit
Admission: RE | Admit: 2022-10-23 | Discharge: 2022-10-23 | Disposition: A | Discharge status: Home / Self Care | Payer: Medicare Other | Source: Ambulatory Visit | Attending: Orthopedic Surgery | Admitting: Orthopedic Surgery

## 2022-10-23 ENCOUNTER — Other Ambulatory Visit: Payer: Self-pay | Admitting: Orthopedic Surgery

## 2022-10-23 DIAGNOSIS — M25551 Pain in right hip: Secondary | ICD-10-CM

## 2022-10-27 ENCOUNTER — Ambulatory Visit
Admission: RE | Admit: 2022-10-27 | Discharge: 2022-10-27 | Disposition: A | Discharge status: Home / Self Care | Payer: Medicare Other | Source: Ambulatory Visit | Attending: Orthopedic Surgery | Admitting: Orthopedic Surgery

## 2022-10-27 ENCOUNTER — Telehealth: Payer: Self-pay | Admitting: Neurosurgery

## 2022-10-27 ENCOUNTER — Other Ambulatory Visit: Payer: Self-pay | Admitting: Orthopedic Surgery

## 2022-10-27 DIAGNOSIS — S32020A Wedge compression fracture of second lumbar vertebra, initial encounter for closed fracture: Secondary | ICD-10-CM | POA: Diagnosis present

## 2022-10-27 DIAGNOSIS — S22080A Wedge compression fracture of T11-T12 vertebra, initial encounter for closed fracture: Secondary | ICD-10-CM | POA: Diagnosis present

## 2022-10-27 NOTE — Telephone Encounter (Signed)
Pt calling in to report that another provider told her that her recent back pain is caused by a "nerve coming out of her spine" wants to let Duwayne Heck know that this is very concerning to her. Will make an appt to see Duwayne Heck

## 2022-10-27 NOTE — Telephone Encounter (Signed)
Duwayne Heck also responded to her [employer]. Appt made for 10/30/22.

## 2022-10-27 NOTE — Telephone Encounter (Signed)
**  Pain has happened following a fall that happened a few weeks ago

## 2022-10-29 ENCOUNTER — Other Ambulatory Visit: Payer: Self-pay

## 2022-10-29 ENCOUNTER — Inpatient Hospital Stay
Admission: RE | Admit: 2022-10-29 | Discharge: 2022-10-29 | Disposition: A | Discharge status: Home / Self Care | Payer: Self-pay | Source: Ambulatory Visit | Attending: Neurosurgery | Admitting: Neurosurgery

## 2022-10-29 DIAGNOSIS — Z049 Encounter for examination and observation for unspecified reason: Secondary | ICD-10-CM

## 2022-10-30 ENCOUNTER — Encounter: Payer: Self-pay | Admitting: Neurosurgery

## 2022-10-30 ENCOUNTER — Ambulatory Visit (INDEPENDENT_AMBULATORY_CARE_PROVIDER_SITE_OTHER): Payer: Medicare Other | Admitting: Neurosurgery

## 2022-10-30 VITALS — BP 138/70 | Ht 61.0 in | Wt 144.0 lb

## 2022-10-30 DIAGNOSIS — M5414 Radiculopathy, thoracic region: Secondary | ICD-10-CM | POA: Diagnosis not present

## 2022-10-30 DIAGNOSIS — W19XXXA Unspecified fall, initial encounter: Secondary | ICD-10-CM

## 2022-10-30 DIAGNOSIS — M546 Pain in thoracic spine: Secondary | ICD-10-CM

## 2022-10-30 DIAGNOSIS — S32000S Wedge compression fracture of unspecified lumbar vertebra, sequela: Secondary | ICD-10-CM

## 2022-10-30 NOTE — Progress Notes (Signed)
Follow-up note: Referring Physician:  Gracelyn Nurse, MD [address] [place],  [place] [address]  Primary Physician:  Gracelyn Nurse, MD  Chief Complaint:  f/u   History of Present Illness: 10/30/22 Laura Cole is a 82 year old female that is well-known to our office for multiple insufficiency fractures.  She presents today at her own request after a fall a few weeks ago resulting in increased pain. She states that her pain starts in her mid back and radiates around under her breast on the right side.  This is worse with movement.  She also describes pain in her right low back and buttock that is worse with walking forwards and improves with walking backwards.  05/06/22 Laura Cole is a 82 y.o female presenting today at her request for further evaluation of persistent and worsening thoracic back pain. She was last seen on 01/14/22 and discharged.  Today she reports worsening back pain after getting into her car in November. Unfortunately it has persisted and she reports pain that radiates around her chest wall. She denies any new weakness.   01/14/22 Laura Cole is a 82 y.o. female who presents for 50-month follow-up.  She has had multiple compression fractures most recently at L2.  She reports some improvement of the severity of her pain since her last visit in June, she continues to have low back pain without radiating leg pain or lower extremity weakness.  She is currently seeing Dr. Cherylann Ratel for pain management and is scheduled with endocrinology next month.  Her PCP did start Fosamax however she is only taking 1 dose of this and decide to discontinue after researching side effects at home.  10/25/21 Laura Cole is a 82 y.o. female who presents with the chief complaint of of worsening mid back that radiates to the hips. This started about 2 weeks ago after getting off the MRI table at [facility]. Since her last visit she has been seen by IR and undergone a kyphoplasty. She had an episode  of increased pain in late May after leaning to wipe herself and heard a pop with associated increased pain. Xrays at [facility] showed concern for a new L2 compression fracture. MRI from 10/16/21 confirmed an acute fracture at this level.  She states that her pain has increased over the last couple of weeks. She is not currently wearing a back brace as the last she received does not fit well and causes increase in pain. She is currently working with [facility] to obtain a better fitting brace however she needs this to be approved by her insurance first that she cannot pay for it out-of-pocket.  2.7.23 Laura CANCELLIERI is a 82 y.o. female who presents for 4 week follow up of T12 compression fracture. She continues to have severe back pain. She does describe some radiating pain into her left rib cage intermittently depending on positioning. She denies any radiating leg pain. She has not yet been fitted for TLSO brace as she does not feel like it will help. She states her pain as worsened since stopping her Tramadol. She is still not interested in considering a kyphoplasty.  05/22/21 note by Dr. Myer Haff Laura. Laura Cole is here today with a chief complaint of thoracic back pain that radiates to her right hip. She was seen in the emergency department after she bent over on December 19 to put on compression stockings. She had immediate onset of lower thoracic back pain. She does not have any weakness. Standing  and walking make her pain worse. Sitting and medication somewhat helped. She lives independently, but this has dramatically impacted her ability to go about her day-to-day life Bowel/Bladder Dysfunction: none Conservative measures:  Physical therapy: has participated at [facility] 03/05/21-04/29/21 for muscle weakness, gait difficulty, unsteadiness Multimodal medical therapy including regular antiinflammatories: oxycodone, tylenol Injections: has not received epidural steroid injections Past Surgery: T8  kyphoplasty Laura Cole has no symptoms of cervical myelopathy.  Review of Systems:  A 10 point review of systems is negative, and the pertinent positives and negatives detailed in the HPI.  Past Medical History: Past Medical History:  Diagnosis Date   Bradycardia    Cancer (HCC)    skin cancer   Coronary artery disease    Diverticulitis    GERD (gastroesophageal reflux disease)    Heart murmur    Hemorrhoids    Hypertension    Hypothyroidism    Osteoarthritis    Peripheral vascular disease (HCC)    TIA (transient ischemic attack)     Past Surgical History: Past Surgical History:  Procedure Laterality Date   ABDOMINAL HYSTERECTOMY     BACK SURGERY     CHOLECYSTECTOMY     COLON SURGERY     COLONOSCOPY WITH PROPOFOL N/A 12/05/2019   Procedure: COLONOSCOPY WITH PROPOFOL;  Surgeon: Regis Bill, MD;  Location: [facility] ENDOSCOPY;  Service: Endoscopy;  Laterality: N/A;   ESOPHAGOGASTRODUODENOSCOPY (EGD) WITH PROPOFOL N/A 12/05/2019   Procedure: ESOPHAGOGASTRODUODENOSCOPY (EGD) WITH PROPOFOL;  Surgeon: Regis Bill, MD;  Location: [facility] ENDOSCOPY;  Service: Endoscopy;  Laterality: N/A;   EYE SURGERY     IR KYPHO LUMBAR INC FX REDUCE BONE BX UNI/BIL CANNULATION INC/IMAGING  08/12/2021   IR RADIOLOGIST EVAL & MGMT  07/30/2021   IR RADIOLOGIST EVAL & MGMT  09/05/2021   IR RADIOLOGIST EVAL & MGMT  09/24/2021   RECTOCELE REPAIR     RECTOCELE REPAIR     REPLACEMENT TOTAL KNEE Bilateral    TKRX2 Bilateral    WRIST SURGERY      Allergies: Allergies as of 10/30/2022 - Review Complete 10/21/2022  Allergen Reaction Noted   Mushroom extract complex Nausea Only and Nausea And Vomiting 10/16/2015   Promethazine  01/11/2019   Alprazolam  02/25/2018   Cefdinir Nausea Only 02/25/2018   Ciprofloxacin  02/25/2018   Dilaudid [hydromorphone hcl]  04/20/2018   Hydromorphone  12/02/2019   Phenergan [promethazine hcl]  04/20/2018   Pravastatin  01/11/2019   Augmentin [amoxicillin-pot  clavulanate] Rash 08/12/2021    Medications: Outpatient Encounter Medications as of 10/30/2022  Medication Sig   amLODipine (NORVASC) 2.5 MG tablet Take 2.5 mg by mouth in the morning and at bedtime.   levothyroxine (SYNTHROID) 50 MCG tablet Take 50 mcg by mouth daily before breakfast.   metoprolol tartrate (LOPRESSOR) 25 MG tablet Take 12.5 mg by mouth 2 (two) times daily.   ondansetron (ZOFRAN-ODT) 4 MG disintegrating tablet Take 4 mg by mouth every 8 (eight) hours as needed for nausea/vomiting. (Patient not taking: Reported on 10/21/2022)   oxyCODONE-acetaminophen (PERCOCET) 5-325 MG tablet Take 1 tablet by mouth every 8 (eight) hours as needed for severe pain. Must last 30 days.   [START ON 11/20/2022] oxyCODONE-acetaminophen (PERCOCET) 5-325 MG tablet Take 1 tablet by mouth every 8 (eight) hours as needed for severe pain. Must last 30 days.   [START ON 12/20/2022] oxyCODONE-acetaminophen (PERCOCET) 5-325 MG tablet Take 1 tablet by mouth every 8 (eight) hours as needed for severe pain. Must last 30 days.  pantoprazole (PROTONIX) 20 MG tablet Take 20 mg by mouth 2 (two) times daily.   polyethylene glycol (MIRALAX / GLYCOLAX) 17 g packet Take 17 g by mouth daily as needed.   No facility-administered encounter medications on file as of 10/30/2022.    Social History: Social History   Tobacco Use   Smoking status: Never   Smokeless tobacco: Never  Vaping Use   Vaping Use: Never used  Substance Use Topics   Alcohol use: Not Currently   Drug use: Never    Family Medical History: Family History  Problem Relation Age of Onset   Breast cancer Sister 27    Exam:  General: CN II-XII grossly intact  ROM of spine: limited Palpation of spine: diffuse TTP throughout thoracolumbar spine and chest wall worse on the right than the left.  Strength MAEW  Gait remains slowed but steady with assistance of a rollator  Imaging: MR L spine 10/27/22 IMPRESSION: 1. No new acute finding in the thoracic  or lumbar spine. 2. Compression deformity of the L2 vertebral body with up to approximately 70% loss of vertebral body height anteriorly and mild bony retropulsion, progressed since the prior CT from 11/07/2021. 3. Compression deformities of the T4, T8, T12, L1, and L4 vertebral bodies with post kyphoplasty changes at T8 and L1 are unchanged. 4. Bony retropulsion at T12 resulting in moderate spinal canal stenosis is unchanged. 5. Scoliotic curvature with overall mild degenerative changes without other significant spinal canal or neural foraminal stenosis.     Electronically Signed   By: Lesia Hausen M.D.   On: 10/27/2022 20:27  MRI T spine 10/27/22 IMPRESSION: 1. No new acute finding in the thoracic or lumbar spine. 2. Compression deformity of the L2 vertebral body with up to approximately 70% loss of vertebral body height anteriorly and mild bony retropulsion, progressed since the prior CT from 11/07/2021. 3. Compression deformities of the T4, T8, T12, L1, and L4 vertebral bodies with post kyphoplasty changes at T8 and L1 are unchanged. 4. Bony retropulsion at T12 resulting in moderate spinal canal stenosis is unchanged. 5. Scoliotic curvature with overall mild degenerative changes without other significant spinal canal or neural foraminal stenosis.     Electronically Signed   By: Lesia Hausen M.D.   On: 10/27/2022 20:27  I have personally reviewed the images and agree with the above interpretation.  Assessment and Plan: Laura. Cole is a pleasant 82 y.o. female with history of osteoporosis and multiple compression fractures with chronic back pain.  Unfortunately she seems to be having symptoms consistent with thoracic radiculopathy.  Given her poor bone quality and extensive compression fractures, she is unlikely to be a surgical candidate particularly in the setting of being intact neurologically.  I did discuss this with her briefly.  I will reach out to Dr. Cherylann Ratel to discuss  possible thoracic ESIs vs consideration of SCS.  I will see her going forward on an as-needed basis.  She expressed understanding and was in agreement with this plan.  I spent a total of 30 minutes in both face-to-face and non-face-to-face activities for this visit on the date of this encounter including review of outside records, review of imaging, discussion of symptoms, physical exam, documentation, communication with outside providers.  Manning Charity PA-C Neurosurgery

## 2022-11-18 ENCOUNTER — Encounter: Payer: Medicare Other | Admitting: Student in an Organized Health Care Education/Training Program

## 2022-12-02 ENCOUNTER — Ambulatory Visit: Payer: Medicare Other | Admitting: Student in an Organized Health Care Education/Training Program

## 2023-01-20 ENCOUNTER — Encounter: Payer: Self-pay | Admitting: Student in an Organized Health Care Education/Training Program

## 2023-01-20 ENCOUNTER — Ambulatory Visit
Payer: Medicare Other | Attending: Student in an Organized Health Care Education/Training Program | Admitting: Student in an Organized Health Care Education/Training Program

## 2023-01-20 VITALS — BP 136/58 | HR 51 | Temp 97.1°F | Resp 16 | Ht 61.0 in | Wt 155.0 lb

## 2023-01-20 DIAGNOSIS — S32020S Wedge compression fracture of second lumbar vertebra, sequela: Secondary | ICD-10-CM | POA: Insufficient documentation

## 2023-01-20 DIAGNOSIS — S22060G Wedge compression fracture of T7-T8 vertebra, subsequent encounter for fracture with delayed healing: Secondary | ICD-10-CM | POA: Diagnosis present

## 2023-01-20 DIAGNOSIS — M48062 Spinal stenosis, lumbar region with neurogenic claudication: Secondary | ICD-10-CM | POA: Insufficient documentation

## 2023-01-20 DIAGNOSIS — Z9889 Other specified postprocedural states: Secondary | ICD-10-CM | POA: Insufficient documentation

## 2023-01-20 DIAGNOSIS — M47814 Spondylosis without myelopathy or radiculopathy, thoracic region: Secondary | ICD-10-CM | POA: Insufficient documentation

## 2023-01-20 DIAGNOSIS — G894 Chronic pain syndrome: Secondary | ICD-10-CM | POA: Insufficient documentation

## 2023-01-20 MED ORDER — OXYCODONE-ACETAMINOPHEN 5-325 MG PO TABS: 1.0000 | ORAL_TABLET | Freq: Three times a day (TID) | ORAL | 0 refills | Status: AC | PRN

## 2023-01-20 MED ORDER — OXYCODONE-ACETAMINOPHEN 5-325 MG PO TABS: 1.0000 | ORAL_TABLET | Freq: Three times a day (TID) | ORAL | 0 refills | Status: DC | PRN

## 2023-01-20 NOTE — Progress Notes (Signed)
Nursing Pain Medication Assessment:  Safety precautions to be maintained throughout the outpatient stay will include: orient to surroundings, keep bed in low position, maintain call bell within reach at all times, provide assistance with transfer out of bed and ambulation.  Medication Inspection Compliance: Pill count conducted under aseptic conditions, in front of the patient. Neither the pills nor the bottle was removed from the patient's sight at any time. Once count was completed pills were immediately returned to the patient in their original bottle.  Medication: Oxycodone/APAP Pill/Patch Count:  18 of 90 pills remain Pill/Patch Appearance: Markings consistent with prescribed medication Bottle Appearance: Standard pharmacy container. Clearly labeled. Filled Date: 08 / 02 / 2024 Last Medication intake:  Today

## 2023-01-20 NOTE — Telephone Encounter (Signed)
This is up to you if you chose to have Reclast IV. You can always schedule a appointment to talk with Dr Cherylann Ratel about this.

## 2023-01-20 NOTE — Progress Notes (Signed)
PROVIDER NOTE: Information contained herein reflects review and annotations entered in association with encounter. Interpretation of such information and data should be left to medically-trained personnel. Information provided to patient can be located elsewhere in the medical record under "Patient Instructions". Document created using STT-dictation technology, any transcriptional errors that may result from process are unintentional.    Patient: Laura Cole  Service Category: E/M  Provider: Edward Jolly, MD  DOB: [birth year]  DOS: 01/20/2023  Referring Provider: Gracelyn Nurse, MD  MRN: [id number]  Specialty: Interventional Pain Management  PCP: Gracelyn Nurse, MD  Type: Established Patient  Setting: Ambulatory outpatient    Location: Office  Delivery: Face-to-face     HPI  Laura Cole, a 82 y.o. 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 Laura Cole primary complain today is Back Pain  Pertinent problems: Laura Cole has Spinal stenosis, lumbar region, with neurogenic claudication; Chronic thoracic back pain; Closed fracture of dorsal (thoracic) vertebra (HCC); Compression fracture of thoracic vertebra with delayed healing; History of kyphoplasty; Spinal stenosis, thoracic; Compression fracture of L2 lumbar vertebra (HCC); Lumbar degenerative disc disease; Encounter for long-term opiate analgesic use; Chronic pain syndrome; and Pain management contract signed on their pertinent problem list. Pain Assessment: Severity of Chronic pain is reported as a 7 /10. Location: Back Lower/radiates around front. Onset: More than a month ago. Quality: Aching, Constant. Timing: Constant. Modifying factor(s): meds. Vitals:  height is 5\' 1"  (1.549 m) and weight is 155 lb (70.3 kg). Her temperature is 97.1 F (36.2 C) (abnormal). Her blood pressure is 136/58 (abnormal) and her pulse is 51 (abnormal). Her respiration is 16 and oxygen saturation is 100%.  BMI: Estimated  body mass index is 29.29 kg/m as calculated from the following:   Height as of this encounter: 5\' 1"  (1.549 m).   Weight as of this encounter: 155 lb (70.3 kg). Last encounter: 10/02/2022. Last procedure: Visit date not found.  Reason for encounter: medication management.   Patient comes in today for medication management and to further discuss spinal cord stimulation versus peripheral nerve stimulation. She is also wondering on certain days if she can take 7.5 mg of oxycodone in the morning as she has increased pain at this time. She is also inquiring about a lumbar back brace.  Pharmacotherapy Assessment  Analgesic: Oxycodone 5 mg every 8 hours as needed     Monitoring: [place] PMP: PDMP reviewed during this encounter.       Pharmacotherapy: No side-effects or adverse reactions reported. Compliance: No problems identified. Effectiveness: Clinically acceptable.  Valerie Salts, RN  01/20/2023 10:36 AM  Sign when Signing Visit Nursing Pain Medication Assessment:  Safety precautions to be maintained throughout the outpatient stay will include: orient to surroundings, keep bed in low position, maintain call bell within reach at all times, provide assistance with transfer out of bed and ambulation.  Medication Inspection Compliance: Pill count conducted under aseptic conditions, in front of the patient. Neither the pills nor the bottle was removed from the patient's sight at any time. Once count was completed pills were immediately returned to the patient in their original bottle.  Medication: Oxycodone/APAP Pill/Patch Count:  18 of 90 pills remain Pill/Patch Appearance: Markings consistent with prescribed medication Bottle Appearance: Standard pharmacy container. Clearly labeled. Filled Date: 08 / 02 / 2024 Last Medication intake:  Today  No results found for: "CBDTHCR" No results found for: "D8THCCBX" No results found for: "D9THCCBX"  UDS:  Summary  Date Value Ref Range Status  12/26/2021  Note  Final    Comment:    ==================================================================== Compliance Drug Analysis, Ur ==================================================================== Test                             Result       Flag       Units  Drug Present and Declared for Prescription Verification   Oxycodone                      1083         EXPECTED   ng/mg creat   Oxymorphone                    1317         EXPECTED   ng/mg creat   Noroxycodone                   1906         EXPECTED   ng/mg creat   Noroxymorphone                 406          EXPECTED   ng/mg creat    Sources of oxycodone are scheduled prescription medications.    Oxymorphone, noroxycodone, and noroxymorphone are expected    metabolites of oxycodone. Oxymorphone is also available as a    scheduled prescription medication.    Baclofen                       PRESENT      EXPECTED   Acetaminophen                  PRESENT      EXPECTED   Metoprolol                     PRESENT      EXPECTED  Drug Absent but Declared for Prescription Verification   Methocarbamol                  Not Detected UNEXPECTED   Naproxen                       Not Detected UNEXPECTED ==================================================================== Test                      Result    Flag   Units      Ref Range   Creatinine              66               mg/dL      >=10 ==================================================================== Declared Medications:  The flagging and interpretation on this report are based on the  following declared medications.  Unexpected results may arise from  inaccuracies in the declared medications.   **Note: The testing scope of this panel includes these medications:   Baclofen (Lioresal)  Methocarbamol (Robaxin)  Metoprolol (Lopressor)  Naproxen (Naprosyn)  Oxycodone (Percocet)   **Note: The testing scope of this panel does not include small to  moderate amounts of these reported  medications:   Acetaminophen (Percocet)   **Note: The testing scope of this panel does not include the  following reported medications:   Alendronate (Fosamax)  Amlodipine (Norvasc)  Ipratropium (Atrovent)  Levothyroxine (Synthroid)  Ondansetron (  Zofran)  Pantoprazole (Protonix) ==================================================================== For clinical consultation, please call [phone]. ====================================================================       ROS  Constitutional: Denies any fever or chills Gastrointestinal: No reported hemesis, hematochezia, vomiting, or acute GI distress Musculoskeletal:  Low back pain Neurological: No reported episodes of acute onset apraxia, aphasia, dysarthria, agnosia, amnesia, paralysis, loss of coordination, or loss of consciousness  Medication Review  amLODipine, levothyroxine, metoprolol tartrate, ondansetron, oxyCODONE-acetaminophen, pantoprazole, and polyethylene glycol  History Review  Allergy: Laura Cole is allergic to mushroom extract complex, promethazine, alprazolam, cefdinir, ciprofloxacin, dilaudid [hydromorphone hcl], hydromorphone, phenergan [promethazine hcl], pravastatin, and augmentin [amoxicillin-pot clavulanate]. Drug: Laura Cole  reports no history of drug use. Alcohol:  reports that she does not currently use alcohol. Tobacco:  reports that she has never smoked. She has never used smokeless tobacco. Social: Laura Cole  reports that she has never smoked. She has never used smokeless tobacco. She reports that she does not currently use alcohol. She reports that she does not use drugs. Medical:  has a past medical history of Bradycardia, Cancer (HCC), Coronary artery disease, Diverticulitis, GERD (gastroesophageal reflux disease), Heart murmur, Hemorrhoids, Hypertension, Hypothyroidism, Osteoarthritis, Peripheral vascular disease (HCC), and TIA (transient ischemic attack). Surgical: Laura Cole  has a past  surgical history that includes Abdominal hysterectomy; Rectocele repair; Cholecystectomy; Wrist surgery; Replacement total knee (Bilateral); Back surgery; TKRX2 (Bilateral); Eye surgery; Rectocele repair; Colon surgery; Colonoscopy with propofol (N/A, 12/05/2019); Esophagogastroduodenoscopy (egd) with propofol (N/A, 12/05/2019); IR Radiologist Eval & Mgmt (07/30/2021); IR KYPHO LUMBAR INC FX REDUCE BONE BX UNI/BIL CANNULATION INC/IMAGING (08/12/2021); IR Radiologist Eval & Mgmt (09/05/2021); and IR Radiologist Eval & Mgmt (09/24/2021). Family: family history includes Breast cancer (age of onset: 12) in her sister.  Laboratory Chemistry Profile   Renal Lab Results  Component Value Date   BUN 14 08/12/2021   CREATININE 0.91 08/12/2021   GFRAA >60 04/20/2018   GFRNONAA >60 08/12/2021    Hepatic Lab Results  Component Value Date   AST 22 05/25/2021   ALT 15 05/25/2021   ALBUMIN 3.7 05/25/2021   ALKPHOS 72 05/25/2021   LIPASE 68 (H) 04/20/2018    Electrolytes Lab Results  Component Value Date   NA 140 08/12/2021   K 3.7 08/12/2021   CL 106 08/12/2021   CALCIUM 10.3 08/12/2021   MG 2.1 05/26/2021    Bone No results found for: "VD25OH", "VD125OH2TOT", "GN5621HY8", "MV7846NG2", "25OHVITD1", "25OHVITD2", "25OHVITD3", "TESTOFREE", "TESTOSTERONE"  Inflammation (CRP: Acute Phase) (ESR: Chronic Phase) No results found for: "CRP", "ESRSEDRATE", "LATICACIDVEN"       Note: Above Lab results reviewed.  Recent Imaging Review  MR THORACIC SPINE WO CONTRAST CLINICAL DATA:  Right greater than left low back pain and hip/buttock pain since December 2022. Multiple compression fractures and kyphoplasties most recently February 2023.  EXAM: MRI THORACIC AND LUMBAR SPINE WITHOUT CONTRAST  TECHNIQUE: Multiplanar and multiecho pulse sequences of the thoracic and lumbar spine were obtained without intravenous contrast.  COMPARISON:  Thoracic and lumbar spine radiographs 05/06/2022, thoracic and lumbar  spine CT 11/07/2020, lumbar spine MRI 10/16/2021, thoracic spine MRI 07/23/2021  FINDINGS: MRI THORACIC SPINE FINDINGS  Alignment: There is mild dextrocurvature centered in the midthoracic spine and mild levocurvature centered at the thoracolumbar junction. There is no antero or retrolisthesis.  Vertebrae: Background marrow signal is normal. Mild compression deformity of the T4 vertebral body is unchanged compared to the CT from 2023. There is no marrow edema.  The patient is status post kyphoplasty at T8. The compression deformity and approximately  40% loss of vertebral body height are unchanged compared to the prior CT.  Severe compression deformity of the T12 vertebral body with retropulsion of the posteroinferior endplate is unchanged, also without marrow edema. The bony retropulsion resulting in moderate spinal canal stenosis, unchanged.  The other vertebral body heights are preserved. There is no evidence of acute fracture. There is no suspicious marrow signal abnormality or marrow edema.  Cord:  Normal in signal and morphology.  Paraspinal and other soft tissues: Unremarkable.  Disc levels:  There is no significant disc herniation in the thoracic spine. There is overall mild multilevel facet arthropathy. There is no significant spinal canal or neural foraminal stenosis, aside from the moderate spinal canal stenosis at T12-L1 due to the bony retropulsion.  MRI LUMBAR SPINE FINDINGS  Segmentation: Standard; the lowest formed disc space is designated L5-S1.  Alignment: Significant dextrocurvature centered at L3 is unchanged there is no antero or retrolisthesis.  Vertebrae: Background marrow signal is normal. There is no suspicious marrow signal abnormality.  There is compression deformity of the L1 vertebral body with up to approximately 30% loss of vertebral body height anteriorly, unchanged. Kyphoplasty changes at this level are again noted. There is no  marrow edema.  There is compression deformity of the L2 vertebral body with up to approximately 70% loss of vertebral body height anteriorly which is progressed since the prior CT from 11/07/2021. There is mild bony retropulsion measuring approximately 3-4 mm without significant spinal canal stenosis.  Mild loss of vertebral body height at L4 without marrow edema or bony retropulsion is unchanged. There is no marrow edema.  Conus medullaris and cauda equina: Conus extends to the L1-L2 level. Conus and cauda equina appear normal.  Paraspinal and other soft tissues: Unremarkable.  Disc levels:  There is overall mild multilevel disc degeneration and facet arthropathy with mild bulges at L2-L3 through L5-S1 but no significant spinal canal or neural foraminal stenosis. These findings are overall unchanged since the prior MRI from 10/16/2021.  IMPRESSION: 1. No new acute finding in the thoracic or lumbar spine. 2. Compression deformity of the L2 vertebral body with up to approximately 70% loss of vertebral body height anteriorly and mild bony retropulsion, progressed since the prior CT from 11/07/2021. 3. Compression deformities of the T4, T8, T12, L1, and L4 vertebral bodies with post kyphoplasty changes at T8 and L1 are unchanged. 4. Bony retropulsion at T12 resulting in moderate spinal canal stenosis is unchanged. 5. Scoliotic curvature with overall mild degenerative changes without other significant spinal canal or neural foraminal stenosis.  Electronically Signed   By: Lesia Hausen M.D.   On: 10/27/2022 20:27 MR LUMBAR SPINE WO CONTRAST CLINICAL DATA:  Right greater than left low back pain and hip/buttock pain since December 2022. Multiple compression fractures and kyphoplasties most recently February 2023.  EXAM: MRI THORACIC AND LUMBAR SPINE WITHOUT CONTRAST  TECHNIQUE: Multiplanar and multiecho pulse sequences of the thoracic and lumbar spine were obtained without  intravenous contrast.  COMPARISON:  Thoracic and lumbar spine radiographs 05/06/2022, thoracic and lumbar spine CT 11/07/2020, lumbar spine MRI 10/16/2021, thoracic spine MRI 07/23/2021  FINDINGS: MRI THORACIC SPINE FINDINGS  Alignment: There is mild dextrocurvature centered in the midthoracic spine and mild levocurvature centered at the thoracolumbar junction. There is no antero or retrolisthesis.  Vertebrae: Background marrow signal is normal. Mild compression deformity of the T4 vertebral body is unchanged compared to the CT from 2023. There is no marrow edema.  The patient is status post kyphoplasty  at T8. The compression deformity and approximately 40% loss of vertebral body height are unchanged compared to the prior CT.  Severe compression deformity of the T12 vertebral body with retropulsion of the posteroinferior endplate is unchanged, also without marrow edema. The bony retropulsion resulting in moderate spinal canal stenosis, unchanged.  The other vertebral body heights are preserved. There is no evidence of acute fracture. There is no suspicious marrow signal abnormality or marrow edema.  Cord:  Normal in signal and morphology.  Paraspinal and other soft tissues: Unremarkable.  Disc levels:  There is no significant disc herniation in the thoracic spine. There is overall mild multilevel facet arthropathy. There is no significant spinal canal or neural foraminal stenosis, aside from the moderate spinal canal stenosis at T12-L1 due to the bony retropulsion.  MRI LUMBAR SPINE FINDINGS  Segmentation: Standard; the lowest formed disc space is designated L5-S1.  Alignment: Significant dextrocurvature centered at L3 is unchanged there is no antero or retrolisthesis.  Vertebrae: Background marrow signal is normal. There is no suspicious marrow signal abnormality.  There is compression deformity of the L1 vertebral body with up to approximately 30% loss of  vertebral body height anteriorly, unchanged. Kyphoplasty changes at this level are again noted. There is no marrow edema.  There is compression deformity of the L2 vertebral body with up to approximately 70% loss of vertebral body height anteriorly which is progressed since the prior CT from 11/07/2021. There is mild bony retropulsion measuring approximately 3-4 mm without significant spinal canal stenosis.  Mild loss of vertebral body height at L4 without marrow edema or bony retropulsion is unchanged. There is no marrow edema.  Conus medullaris and cauda equina: Conus extends to the L1-L2 level. Conus and cauda equina appear normal.  Paraspinal and other soft tissues: Unremarkable.  Disc levels:  There is overall mild multilevel disc degeneration and facet arthropathy with mild bulges at L2-L3 through L5-S1 but no significant spinal canal or neural foraminal stenosis. These findings are overall unchanged since the prior MRI from 10/16/2021.  IMPRESSION: 1. No new acute finding in the thoracic or lumbar spine. 2. Compression deformity of the L2 vertebral body with up to approximately 70% loss of vertebral body height anteriorly and mild bony retropulsion, progressed since the prior CT from 11/07/2021. 3. Compression deformities of the T4, T8, T12, L1, and L4 vertebral bodies with post kyphoplasty changes at T8 and L1 are unchanged. 4. Bony retropulsion at T12 resulting in moderate spinal canal stenosis is unchanged. 5. Scoliotic curvature with overall mild degenerative changes without other significant spinal canal or neural foraminal stenosis.  Electronically Signed   By: Lesia Hausen M.D.   On: 10/27/2022 20:27 Note: Reviewed        Physical Exam  General appearance: Well nourished, well developed, and well hydrated. In no apparent acute distress Mental status: Alert, oriented x 3 (person, place, & time)       Respiratory: No evidence of acute respiratory  distress Eyes: PERLA Vitals: BP (!) 136/58   Pulse (!) 51   Temp (!) 97.1 F (36.2 C)   Resp 16   Ht 5\' 1"  (1.549 m)   Wt 155 lb (70.3 kg)   LMP  (LMP Unknown)   SpO2 100%   BMI 29.29 kg/m  BMI: Estimated body mass index is 29.29 kg/m as calculated from the following:   Height as of this encounter: 5\' 1"  (1.549 m).   Weight as of this encounter: 155 lb (70.3 kg). Ideal: Ideal  body weight: 47.8 kg (105 lb 6.1 oz) Adjusted ideal body weight: 56.8 kg (125 lb 3.7 oz)  Thoracic Spine Area Exam  Skin & Axial Inspection: Significant thoracic kyphosis Alignment: Asymmetric Functional ROM: Unrestricted ROM Stability: No instability detected Muscle Tone/Strength: Functionally intact. No obvious neuro-muscular anomalies detected. Sensory (Neurological): Musculoskeletal pain pattern Muscle strength & Tone: No palpable anomalies Lumbar Spine Area Exam  Skin & Axial Inspection: Lumbar Scoliosis Alignment: Levoscoliosis Functional ROM: Pain restricted ROM       Stability: No instability detected Muscle Tone/Strength: Functionally intact. No obvious neuro-muscular anomalies detected. Sensory (Neurological): Musculoskeletal pain pattern and neurogenic   Gait & Posture Assessment  Ambulation: Patient ambulates using a walker Gait: Limited. Using assistive device to ambulate Posture: Difficulty standing up straight, due to pain  Lower Extremity Exam      Side: Right lower extremity   Side: Left lower extremity  Stability: No instability observed           Stability: No instability observed          Skin & Extremity Inspection: Skin color, temperature, and hair growth are WNL. No peripheral edema or cyanosis. No masses, redness, swelling, asymmetry, or associated skin lesions. No contractures.   Skin & Extremity Inspection: Skin color, temperature, and hair growth are WNL. No peripheral edema or cyanosis. No masses, redness, swelling, asymmetry, or associated skin lesions. No contractures.   Functional ROM: Pain restricted ROM for hip and knee joints           Functional ROM: Pain restricted ROM for hip and knee joints          Muscle Tone/Strength: Functionally intact. No obvious neuro-muscular anomalies detected.   Muscle Tone/Strength: Functionally intact. No obvious neuro-muscular anomalies detected.  Sensory (Neurological): Neurogenic pain pattern         Sensory (Neurological): Neurogenic pain pattern        DTR: Patellar: deferred today Achilles: deferred today Plantar: deferred today   DTR: Patellar: deferred today Achilles: deferred today Plantar: deferred today  Palpation: No palpable anomalies   Palpation: No palpable anomalies     Assessment   Diagnosis Status  1. Arthropathy of thoracic facet joint   2. Spinal stenosis, lumbar region, with neurogenic claudication   3. Compression fracture of T8 vertebra with delayed healing, subsequent encounter   4. History of kyphoplasty   5. Compression fracture of L2 vertebra, sequela   6. Chronic pain syndrome    Controlled Controlled Controlled    Plan of Care   Laura Cole has a current medication list which includes the following long-term medication(s): amlodipine, levothyroxine, metoprolol tartrate, and pantoprazole.  We discussed spinal cord stimulation and peripheral nerve stimulation.  Patient states that she wants to hold off at this time but these could be options for her in the future.  Otherwise we will refill of her oxycodone as below and make a slight increase in quantity so that she can take 1.5 tablets or mornings to help with her increased pain during that time.  Otherwise we discussed strategies to minimize fall risk.  I encouraged her to get [place] more information about the back brace that she was describing so that we may be able to facilitate her getting that.   Pharmacotherapy (Medications Ordered): Meds ordered this encounter  Medications   oxyCODONE-acetaminophen (PERCOCET) 5-325 MG  tablet    Sig: Take 1-1.5 tablets by mouth every 8 (eight) hours as needed for severe pain. Must last 30 days.  Dispense:  105 tablet    Refill:  0    Chronic Pain: STOP Act (Not applicable) Fill 1 day early if closed on refill date. Avoid benzodiazepines within 8 hours of opioids   oxyCODONE-acetaminophen (PERCOCET) 5-325 MG tablet    Sig: Take 1-1.5 tablets by mouth every 8 (eight) hours as needed for severe pain. Must last 30 days.    Dispense:  105 tablet    Refill:  0    Chronic Pain: STOP Act (Not applicable) Fill 1 day early if closed on refill date. Avoid benzodiazepines within 8 hours of opioids   oxyCODONE-acetaminophen (PERCOCET) 5-325 MG tablet    Sig: Take 1-1.5 tablets by mouth every 8 (eight) hours as needed for severe pain. Must last 30 days.    Dispense:  105 tablet    Refill:  0    Chronic Pain: STOP Act (Not applicable) Fill 1 day early if closed on refill date. Avoid benzodiazepines within 8 hours of opioids   Orders:  No orders of the defined types were placed in this encounter.  Follow-up plan:   Return in about 3 months (around 04/21/2023) for Medication Management, in person.      Recent Visits No visits were found meeting these conditions. Showing recent visits within past 90 days and meeting all other requirements Today's Visits Date Type Provider Dept  01/20/23 Office Visit Edward Jolly, MD [facility]  Showing today's visits and meeting all other requirements Future Appointments No visits were found meeting these conditions. Showing future appointments within next 90 days and meeting all other requirements  I discussed the assessment and treatment plan with the patient. The patient was provided an opportunity to ask questions and all were answered. The patient agreed with the plan and demonstrated an understanding of the instructions.  Patient advised to call back or seek an in-person evaluation if the symptoms or condition  worsens.  Duration of encounter: .  Total time on encounter, as per AMA guidelines included both the face-to-face and non-face-to-face time personally spent by the physician and/or other qualified health care professional(s) on the day of the encounter (includes time in activities that require the physician or other qualified health care professional and does not include time in activities normally performed by clinical staff). Physician's time may include the following activities when performed: Preparing to see the patient (e.g., pre-charting review of records, searching for previously ordered imaging, lab work, and nerve conduction tests) Review of prior analgesic pharmacotherapies. Reviewing PMP Interpreting ordered tests (e.g., lab work, imaging, nerve conduction tests) Performing post-procedure evaluations, including interpretation of diagnostic procedures Obtaining and/or reviewing separately obtained history Performing a medically appropriate examination and/or evaluation Counseling and educating the patient/family/caregiver Ordering medications, tests, or procedures Referring and communicating with other health care professionals (when not separately reported) Documenting clinical information in the electronic or other health record Independently interpreting results (not separately reported) and communicating results to the patient/ family/caregiver Care coordination (not separately reported)  Note by: Edward Jolly, MD Date: 01/20/2023; Time: 11:29 AM

## 2023-02-02 ENCOUNTER — Other Ambulatory Visit: Payer: Self-pay | Admitting: Internal Medicine

## 2023-02-02 DIAGNOSIS — Z1231 Encounter for screening mammogram for malignant neoplasm of breast: Secondary | ICD-10-CM

## 2023-02-02 NOTE — Telephone Encounter (Signed)
You are the only one that can decide what is best for you.

## 2023-03-04 ENCOUNTER — Ambulatory Visit
Admission: RE | Admit: 2023-03-04 | Discharge: 2023-03-04 | Disposition: A | Discharge status: Home / Self Care | Payer: Medicare Other | Source: Ambulatory Visit | Attending: Internal Medicine | Admitting: Internal Medicine

## 2023-03-04 DIAGNOSIS — Z1231 Encounter for screening mammogram for malignant neoplasm of breast: Secondary | ICD-10-CM | POA: Insufficient documentation

## 2023-04-21 ENCOUNTER — Encounter: Payer: Self-pay | Admitting: Student in an Organized Health Care Education/Training Program

## 2023-04-21 ENCOUNTER — Ambulatory Visit
Payer: Medicare Other | Attending: Student in an Organized Health Care Education/Training Program | Admitting: Student in an Organized Health Care Education/Training Program

## 2023-04-21 VITALS — BP 137/71 | HR 65 | Temp 97.6°F | Ht 61.0 in | Wt 151.0 lb

## 2023-04-21 DIAGNOSIS — S32020S Wedge compression fracture of second lumbar vertebra, sequela: Secondary | ICD-10-CM | POA: Diagnosis present

## 2023-04-21 DIAGNOSIS — S22060G Wedge compression fracture of T7-T8 vertebra, subsequent encounter for fracture with delayed healing: Secondary | ICD-10-CM

## 2023-04-21 DIAGNOSIS — M47814 Spondylosis without myelopathy or radiculopathy, thoracic region: Secondary | ICD-10-CM

## 2023-04-21 DIAGNOSIS — G894 Chronic pain syndrome: Secondary | ICD-10-CM

## 2023-04-21 DIAGNOSIS — Z9889 Other specified postprocedural states: Secondary | ICD-10-CM

## 2023-04-21 DIAGNOSIS — M4804 Spinal stenosis, thoracic region: Secondary | ICD-10-CM | POA: Diagnosis present

## 2023-04-21 DIAGNOSIS — M48062 Spinal stenosis, lumbar region with neurogenic claudication: Secondary | ICD-10-CM

## 2023-04-21 MED ORDER — OXYCODONE-ACETAMINOPHEN 5-325 MG PO TABS
1.0000 | ORAL_TABLET | Freq: Three times a day (TID) | ORAL | 0 refills | Status: AC | PRN
Start: 2023-05-24 — End: 2023-06-23

## 2023-04-21 MED ORDER — OXYCODONE-ACETAMINOPHEN 5-325 MG PO TABS
1.0000 | ORAL_TABLET | Freq: Three times a day (TID) | ORAL | 0 refills | Status: AC | PRN
Start: 2023-04-24 — End: 2023-05-24

## 2023-04-21 MED ORDER — OXYCODONE-ACETAMINOPHEN 5-325 MG PO TABS
1.0000 | ORAL_TABLET | Freq: Three times a day (TID) | ORAL | 0 refills | Status: DC | PRN
Start: 2023-06-23 — End: 2023-07-23

## 2023-04-21 NOTE — Patient Instructions (Signed)

## 2023-04-21 NOTE — Progress Notes (Signed)
Nursing Pain Medication Assessment:  Safety precautions to be maintained throughout the outpatient stay will include: orient to surroundings, keep bed in low position, maintain call bell within reach at all times, provide assistance with transfer out of bed and ambulation.  Medication Inspection Compliance: Pill count conducted under aseptic conditions, in front of the patient. Neither the pills nor the bottle was removed from the patient's sight at any time. Once count was completed pills were immediately returned to the patient in their original bottle.  Medication: Oxycodone/APAP Pill/Patch Count:  29 of 105 pills remain Pill/Patch Appearance: Markings consistent with prescribed medication Bottle Appearance: Standard pharmacy container. Clearly labeled. Filled Date: 6 / 6 / 2024 Last Medication intake:  TodaySafety precautions to be maintained throughout the outpatient stay will include: orient to surroundings, keep bed in low position, maintain call bell within reach at all times, provide assistance with transfer out of bed and ambulation.

## 2023-04-21 NOTE — Progress Notes (Signed)
PROVIDER NOTE: Information contained herein reflects review and annotations entered in association with encounter. Interpretation of such information and data should be left to medically-trained personnel. Information provided to patient can be located elsewhere in the medical record under "Patient Instructions". Document created using STT-dictation technology, any transcriptional errors that may result from process are unintentional.    Patient: Laura Cole  Service Category: E/M  Provider: Edward Jolly, MD  DOB: 1941/01/05  DOS: 04/21/2023  Referring Provider: Gracelyn Nurse, MD  MRN: [id number]  Specialty: Interventional Pain Management  PCP: Gracelyn Nurse, MD  Type: Established Patient  Setting: Ambulatory outpatient    Location: Office  Delivery: Face-to-face     HPI  [name], a 82 y.o. year old female, is here today because of her Chronic pain syndrome [G89.4]. [name] primary complain today is Back Pain (Mid and lower)  Pertinent problems: [name] has Spinal stenosis, lumbar region, with neurogenic claudication; Chronic thoracic back pain; Closed fracture of dorsal (thoracic) vertebra (HCC); Compression fracture of thoracic vertebra with delayed healing; History of kyphoplasty; Spinal stenosis, thoracic; Compression fracture of L2 lumbar vertebra (HCC); Lumbar degenerative disc disease; Encounter for long-term opiate analgesic use; Chronic pain syndrome; and Pain management contract signed on their pertinent problem list. Pain Assessment: Severity of Chronic pain is reported as a 8 /10. Location: Back Mid, Lower/denies. Onset: More than a month ago. Quality: Contraction. Timing: Constant. Modifying factor(s): meds. Vitals:  height is 5\' 1"  (1.549 m) and weight is 151 lb (68.5 kg). Her temperature is 97.6 F (36.4 C). Her blood pressure is 137/71 and her pulse is 65. Her oxygen saturation is 99%.  BMI: Estimated body mass index is 28.53 kg/m as calculated from the following:    Height as of this encounter: 5\' 1"  (1.549 m).   Weight as of this encounter: 151 lb (68.5 kg). Last encounter: 01/20/2023. Last procedure: Visit date not found.  Reason for encounter: medication management.   Discussed the use of AI scribe software for clinical note transcription with the patient, who gave verbal consent to proceed.  History of Present Illness   The patient, with a history of chronic pain managed with Oxycodone, presents with concerns about the long-term effects of her medications. She has been on Levothyroxine for several years and recently read about potential bone loss associated with its use. Given her history of compression fractures, she is understandably concerned. She plans to discuss this with her primary care provider.  Additionally, the patient has concerns about the potential for cognitive decline, specifically Alzheimer's, due to long-term use of Oxycodone. She has been on a low dose for chronic pain management and is aware of the slightly increased risk of cognitive dysfunction. She is weighing the benefits of pain relief against these potential risks.  The patient also reports dietary restrictions due to gastrointestinal discomfort. She has a limited diet consisting mainly of eggs, tomatoes, Boost, and yogurt. She expresses concern about the high protein content in Boost potentially contributing to Alzheimer's risk, but it was clarified that this risk is associated with excessive protein intake beyond her current consumption.       Pharmacotherapy Assessment  Analgesic: Oxycodone 5 mg every 6-8 hours as needed     Monitoring: [place] PMP: PDMP not reviewed this encounter.       Pharmacotherapy: No side-effects or adverse reactions reported. Compliance: No problems identified. Effectiveness: Clinically acceptable.  Florina Ou, RN  04/21/2023 10:41 AM  Sign when Signing Visit Nursing  Pain Medication Assessment:  Safety precautions to be maintained throughout  the outpatient stay will include: orient to surroundings, keep bed in low position, maintain call bell within reach at all times, provide assistance with transfer out of bed and ambulation.  Medication Inspection Compliance: Pill count conducted under aseptic conditions, in front of the patient. Neither the pills nor the bottle was removed from the patient's sight at any time. Once count was completed pills were immediately returned to the patient in their original bottle.  Medication: Oxycodone/APAP Pill/Patch Count:  29 of 105 pills remain Pill/Patch Appearance: Markings consistent with prescribed medication Bottle Appearance: Standard pharmacy container. Clearly labeled. Filled Date: 10 / 6 / 2024 Last Medication intake:  TodaySafety precautions to be maintained throughout the outpatient stay will include: orient to surroundings, keep bed in low position, maintain call bell within reach at all times, provide assistance with transfer out of bed and ambulation.   No results found for: "CBDTHCR" No results found for: "D8THCCBX" No results found for: "D9THCCBX"  UDS:  Summary  Date Value Ref Range Status  12/26/2021 Note  Final    Comment:    ==================================================================== Compliance Drug Analysis, Ur ==================================================================== Test                             Result       Flag       Units  Drug Present and Declared for Prescription Verification   Oxycodone                      1083         EXPECTED   ng/mg creat   Oxymorphone                    1317         EXPECTED   ng/mg creat   Noroxycodone                   1906         EXPECTED   ng/mg creat   Noroxymorphone                 406          EXPECTED   ng/mg creat    Sources of oxycodone are scheduled prescription medications.    Oxymorphone, noroxycodone, and noroxymorphone are expected    metabolites of oxycodone. Oxymorphone is also available as a     scheduled prescription medication.    Baclofen                       PRESENT      EXPECTED   Acetaminophen                  PRESENT      EXPECTED   Metoprolol                     PRESENT      EXPECTED  Drug Absent but Declared for Prescription Verification   Methocarbamol                  Not Detected UNEXPECTED   Naproxen                       Not Detected UNEXPECTED ==================================================================== Test  Result    Flag   Units      Ref Range   Creatinine              66               mg/dL      >=16 ==================================================================== Declared Medications:  The flagging and interpretation on this report are based on the  following declared medications.  Unexpected results may arise from  inaccuracies in the declared medications.   **Note: The testing scope of this panel includes these medications:   Baclofen (Lioresal)  Methocarbamol (Robaxin)  Metoprolol (Lopressor)  Naproxen (Naprosyn)  Oxycodone (Percocet)   **Note: The testing scope of this panel does not include small to  moderate amounts of these reported medications:   Acetaminophen (Percocet)   **Note: The testing scope of this panel does not include the  following reported medications:   Alendronate (Fosamax)  Amlodipine (Norvasc)  Ipratropium (Atrovent)  Levothyroxine (Synthroid)  Ondansetron (Zofran)  Pantoprazole (Protonix) ==================================================================== For clinical consultation, please call [phone]. ====================================================================       ROS  Constitutional: Denies any fever or chills Gastrointestinal: No reported hemesis, hematochezia, vomiting, or acute GI distress Musculoskeletal: Denies any acute onset joint swelling, redness, loss of ROM, or weakness Neurological: No reported episodes of acute onset apraxia, aphasia, dysarthria,  agnosia, amnesia, paralysis, loss of coordination, or loss of consciousness  Medication Review  amLODipine, levothyroxine, metoprolol tartrate, ondansetron, oxyCODONE-acetaminophen, pantoprazole, and polyethylene glycol  History Review  Allergy: Ms. Tufts is allergic to mushroom extract complex, promethazine, alprazolam, cefdinir, ciprofloxacin, dilaudid [hydromorphone hcl], hydromorphone, phenergan [promethazine hcl], pravastatin, and augmentin [amoxicillin-pot clavulanate]. Drug: Ms. Auton  reports no history of drug use. Alcohol:  reports that she does not currently use alcohol. Tobacco:  reports that she has never smoked. She has never used smokeless tobacco. Social: Ms. Borman  reports that she has never smoked. She has never used smokeless tobacco. She reports that she does not currently use alcohol. She reports that she does not use drugs. Medical:  has a past medical history of Bradycardia, Cancer (HCC), Coronary artery disease, Diverticulitis, GERD (gastroesophageal reflux disease), Heart murmur, Hemorrhoids, Hypertension, Hypothyroidism, Osteoarthritis, Peripheral vascular disease (HCC), and TIA (transient ischemic attack). Surgical: Ms. Shonka  has a past surgical history that includes Abdominal hysterectomy; Rectocele repair; Cholecystectomy; Wrist surgery; Replacement total knee (Bilateral); Back surgery; TKRX2 (Bilateral); Eye surgery; Rectocele repair; Colon surgery; Colonoscopy with propofol (N/A, 12/05/2019); Esophagogastroduodenoscopy (egd) with propofol (N/A, 12/05/2019); IR Radiologist Eval & Mgmt (07/30/2021); IR KYPHO LUMBAR INC FX REDUCE BONE BX UNI/BIL CANNULATION INC/IMAGING (08/12/2021); IR Radiologist Eval & Mgmt (09/05/2021); and IR Radiologist Eval & Mgmt (09/24/2021). Family: family history includes Breast cancer (age of onset: 79) in her sister.  Laboratory Chemistry Profile   Renal Lab Results  Component Value Date   BUN 14 08/12/2021   CREATININE 0.91 08/12/2021   GFRAA  >60 04/20/2018   GFRNONAA >60 08/12/2021    Hepatic Lab Results  Component Value Date   AST 22 05/25/2021   ALT 15 05/25/2021   ALBUMIN 3.7 05/25/2021   ALKPHOS 72 05/25/2021   LIPASE 68 (H) 04/20/2018    Electrolytes Lab Results  Component Value Date   NA 140 08/12/2021   K 3.7 08/12/2021   CL 106 08/12/2021   CALCIUM 10.3 08/12/2021   MG 2.1 05/26/2021    Bone No results found for: "VD25OH", "VD125OH2TOT", "WJ1914NW2", "NF6213YQ6", "25OHVITD1", "25OHVITD2", "25OHVITD3", "TESTOFREE", "TESTOSTERONE"  Inflammation (CRP: Acute Phase) (ESR: Chronic  Phase) No results found for: "CRP", "ESRSEDRATE", "LATICACIDVEN"       Note: Above Lab results reviewed.  Recent Imaging Review  MM 3D SCREENING MAMMOGRAM BILATERAL BREAST CLINICAL DATA:  Screening.  EXAM: DIGITAL SCREENING BILATERAL MAMMOGRAM WITH TOMOSYNTHESIS AND CAD  TECHNIQUE: Bilateral screening digital craniocaudal and mediolateral oblique mammograms were obtained. Bilateral screening digital breast tomosynthesis was performed. The images were evaluated with computer-aided detection.  COMPARISON:  Previous exam(s).  ACR Breast Density Category b: There are scattered areas of fibroglandular density.  FINDINGS: There are no findings suspicious for malignancy.  IMPRESSION: No mammographic evidence of malignancy. A result letter of this screening mammogram will be mailed directly to the patient.  RECOMMENDATION: Screening mammogram in one year. (Code:SM-B-01Y)  BI-RADS CATEGORY  1: Negative.  Electronically Signed   By: Frederico Hamman M.D.   On: 03/06/2023 13:31 Note: Reviewed        Physical Exam  General appearance: Well nourished, well developed, and well hydrated. In no apparent acute distress Mental status: Alert, oriented x 3 (person, place, & time)       Respiratory: No evidence of acute respiratory distress Eyes: PERLA Vitals: BP 137/71   Pulse 65   Temp 97.6 F (36.4 C)   Ht 5\' 1"  (1.549 m)    Wt 151 lb (68.5 kg)   LMP  (LMP Unknown)   SpO2 99%   BMI 28.53 kg/m  BMI: Estimated body mass index is 28.53 kg/m as calculated from the following:   Height as of this encounter: 5\' 1"  (1.549 m).   Weight as of this encounter: 151 lb (68.5 kg). Ideal: Ideal body weight: 47.8 kg (105 lb 6.1 oz) Adjusted ideal body weight: 56.1 kg (123 lb 10 oz)  Assessment   Diagnosis Status  1. Chronic pain syndrome   2. Arthropathy of thoracic facet joint   3. Spinal stenosis, lumbar region, with neurogenic claudication   4. Compression fracture of T8 vertebra with delayed healing, subsequent encounter   5. History of kyphoplasty   6. Compression fracture of L2 vertebra, sequela   7. Spinal stenosis, thoracic    Controlled Controlled Controlled   Updated Problems: No problems updated.  Plan of Care  Problem-specific:  Assessment and Plan    Chronic Pain Management   She is on oxycodone for chronic pain management and expressed concern about the risk of Alzheimer's with long-term opioid use. We discussed that her current dosage and usage pattern do not pose a high risk, and we talked about the importance of a risk-benefit analysis and maintaining a low dose. She has a surplus of pills because she does not take them at night. We considered reducing the prescription quantity to 90 pills, but she prefers to keep the current quantity. We will continue the current oxycodone regimen of one pill every eight hours plus half a pill at lunch. The prescription will be refilled on December 6, January 5, and February 4. A follow-up appointment is scheduled when picking up the last prescription in the series.  Nutritional Concerns   She reports difficulty eating a varied diet due to gastrointestinal issues, primarily consuming eggs, tomatoes, Boost, and yogurt. We suggested adding cheese for additional calories, vitamin D, and calcium and reassured her that her current protein intake does not increase  Alzheimer's risk. She should add cheese to meals for additional nutrients and consider adding another Boost during the day for extra nutrition.  Hypothyroidism   She is on levothyroxine and concerned about potential bone  loss, acknowledging a history of compression fractures. We advised discussing this concern with the prescribing doctor of levothyroxine. She will discuss potential bone loss with her prescribing doctor.       Ms. Ratzy Kingsmore has a current medication list which includes the following long-term medication(s): amlodipine, levothyroxine, metoprolol tartrate, and pantoprazole.  Pharmacotherapy (Medications Ordered): Meds ordered this encounter  Medications   oxyCODONE-acetaminophen (PERCOCET) 5-325 MG tablet    Sig: Take 1-1.5 tablets by mouth every 8 (eight) hours as needed for severe pain (pain score 7-10). Must last 30 days.    Dispense:  105 tablet    Refill:  0    Chronic Pain: STOP Act (Not applicable) Fill 1 day early if closed on refill date. Avoid benzodiazepines within 8 hours of opioids   oxyCODONE-acetaminophen (PERCOCET) 5-325 MG tablet    Sig: Take 1-1.5 tablets by mouth every 8 (eight) hours as needed for severe pain (pain score 7-10). Must last 30 days.    Dispense:  105 tablet    Refill:  0    Chronic Pain: STOP Act (Not applicable) Fill 1 day early if closed on refill date. Avoid benzodiazepines within 8 hours of opioids   oxyCODONE-acetaminophen (PERCOCET) 5-325 MG tablet    Sig: Take 1-1.5 tablets by mouth every 8 (eight) hours as needed for severe pain (pain score 7-10). Must last 30 days.    Dispense:  105 tablet    Refill:  0    Chronic Pain: STOP Act (Not applicable) Fill 1 day early if closed on refill date. Avoid benzodiazepines within 8 hours of opioids   Orders:  No orders of the defined types were placed in this encounter.  Follow-up plan:   Return for patient will call to schedule F2F appt prn.     Recent Visits No visits were found  meeting these conditions. Showing recent visits within past 90 days and meeting all other requirements Today's Visits Date Type Provider Dept  04/21/23 Office Visit Edward Jolly, MD [facility]  Showing today's visits and meeting all other requirements Future Appointments No visits were found meeting these conditions. Showing future appointments within next 90 days and meeting all other requirements  I discussed the assessment and treatment plan with the patient. The patient was provided an opportunity to ask questions and all were answered. The patient agreed with the plan and demonstrated an understanding of the instructions.  Patient advised to call back or seek an in-person evaluation if the symptoms or condition worsens.  Duration of encounter: .  Total time on encounter, as per AMA guidelines included both the face-to-face and non-face-to-face time personally spent by the physician and/or other qualified health care professional(s) on the day of the encounter (includes time in activities that require the physician or other qualified health care professional and does not include time in activities normally performed by clinical staff). Physician's time may include the following activities when performed: Preparing to see the patient (e.g., pre-charting review of records, searching for previously ordered imaging, lab work, and nerve conduction tests) Review of prior analgesic pharmacotherapies. Reviewing PMP Interpreting ordered tests (e.g., lab work, imaging, nerve conduction tests) Performing post-procedure evaluations, including interpretation of diagnostic procedures Obtaining and/or reviewing separately obtained history Performing a medically appropriate examination and/or evaluation Counseling and educating the patient/family/caregiver Ordering medications, tests, or procedures Referring and communicating with other health care professionals (when not separately  reported) Documenting clinical information in the electronic or other health record Independently interpreting results (not separately  reported) and communicating results to the patient/ family/caregiver Care coordination (not separately reported)  Note by: Edward Jolly, MD Date: 04/21/2023; Time: 11:27 AM

## 2023-06-01 ENCOUNTER — Encounter: Payer: Self-pay | Admitting: Student in an Organized Health Care Education/Training Program

## 2023-07-23 ENCOUNTER — Ambulatory Visit
Payer: Medicare Other | Attending: Student in an Organized Health Care Education/Training Program | Admitting: Student in an Organized Health Care Education/Training Program

## 2023-07-23 ENCOUNTER — Encounter: Payer: Self-pay | Admitting: Student in an Organized Health Care Education/Training Program

## 2023-07-23 VITALS — BP 147/80 | HR 90 | Temp 96.6°F | Resp 16 | Ht 61.0 in | Wt 151.0 lb

## 2023-07-23 DIAGNOSIS — M48062 Spinal stenosis, lumbar region with neurogenic claudication: Secondary | ICD-10-CM | POA: Diagnosis present

## 2023-07-23 DIAGNOSIS — M4804 Spinal stenosis, thoracic region: Secondary | ICD-10-CM | POA: Diagnosis present

## 2023-07-23 DIAGNOSIS — S22060G Wedge compression fracture of T7-T8 vertebra, subsequent encounter for fracture with delayed healing: Secondary | ICD-10-CM | POA: Diagnosis present

## 2023-07-23 DIAGNOSIS — G894 Chronic pain syndrome: Secondary | ICD-10-CM | POA: Diagnosis present

## 2023-07-23 DIAGNOSIS — M47814 Spondylosis without myelopathy or radiculopathy, thoracic region: Secondary | ICD-10-CM | POA: Insufficient documentation

## 2023-07-23 DIAGNOSIS — Z79891 Long term (current) use of opiate analgesic: Secondary | ICD-10-CM | POA: Insufficient documentation

## 2023-07-23 MED ORDER — OXYCODONE-ACETAMINOPHEN 5-325 MG PO TABS
1.0000 | ORAL_TABLET | Freq: Three times a day (TID) | ORAL | 0 refills | Status: AC | PRN
Start: 2023-07-31 — End: 2023-08-30

## 2023-07-23 MED ORDER — OXYCODONE-ACETAMINOPHEN 5-325 MG PO TABS
1.0000 | ORAL_TABLET | Freq: Three times a day (TID) | ORAL | 0 refills | Status: DC | PRN
Start: 2023-09-29 — End: 2023-09-14

## 2023-07-23 MED ORDER — OXYCODONE-ACETAMINOPHEN 5-325 MG PO TABS
1.0000 | ORAL_TABLET | Freq: Three times a day (TID) | ORAL | 0 refills | Status: AC | PRN
Start: 2023-08-30 — End: 2023-09-29

## 2023-07-23 NOTE — Progress Notes (Signed)
 PROVIDER NOTE: Information contained herein reflects review and annotations entered in association with encounter. Interpretation of such information and data should be left to medically-trained personnel. Information provided to patient can be located elsewhere in the medical record under "Patient Instructions". Document created using STT-dictation technology, any transcriptional errors that may result from process are unintentional.    Patient: Laura Cole  Service Category: E/M  Provider: Edward Jolly, MD  DOB: [birth year]  DOS: 07/23/2023  Referring Provider: Gracelyn Nurse, MD  MRN: [id number]  Specialty: Interventional Pain Management  PCP: Gracelyn Nurse, MD  Type: Established Patient  Setting: Ambulatory outpatient    Location: Office  Delivery: Face-to-face     HPI  [name], a 83 y.o. 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 [name] today is Back Pain and Abdominal Pain (Back pain radiates around both sides to abdomen)  Pertinent problems: [name] has Spinal stenosis, lumbar region, with neurogenic claudication; Chronic thoracic back pain; Closed fracture of dorsal (thoracic) vertebra (HCC); Compression fracture of thoracic vertebra with delayed healing; History of kyphoplasty; Spinal stenosis, thoracic; Compression fracture of L2 lumbar vertebra (HCC); Lumbar degenerative disc disease; Encounter for long-term opiate analgesic use; Chronic pain syndrome; and Pain management contract signed on their pertinent problem list. Pain Assessment: Severity of Chronic pain is reported as a 7 /10. Location: Back Lower, Mid/through right and left flank to abdomen, most painful at mid back. Onset: More than a month ago. Quality: Aching, Other (Comment) ("feels like someone is cutting my back"). Timing: Constant. Modifying factor(s): meds. Vitals:  height is 5\' 1"  (1.549 m) and weight is 151 lb (68.5 kg). Her temperature is 96.6  F (35.9 C) (abnormal). Her blood pressure is 147/80 (abnormal) and her pulse is 90. Her respiration is 16 and oxygen saturation is 98%.  BMI: Estimated body mass index is 28.53 kg/m as calculated from the following:   Height as of this encounter: 5\' 1"  (1.549 m).   Weight as of this encounter: 151 lb (68.5 kg). Last encounter: 01/20/2023. Last procedure: Visit date not found.  Reason for encounter: medication management.   Discussed the use of AI scribe software for clinical note transcription with the patient, who gave verbal consent to proceed.  History of Present Illness   Laura Cole is an 83 year old female who presents for pain management follow-up.  She suspects a rib fracture on the left side that occurred about a month ago while getting out of bed. She describes hearing and feeling something at the time of the incident, which caused her to scream. She has not sought medical attention for this injury, as she has been advised previously that there is little that can be done for her rib fractures  She experiences significant jaw pain, which has led her to avoid eating certain foods like nuts and some cookies. She is concerned about the potential for her jaw to break.  Her medication regimen includes three prescribed pain medications daily, with an additional medication around 2 PM due to severe pain.   She experiences night sweats, which she describes as waking up with wet clothes, particularly around her head. This occurs only at night, and she does not associate it with pain or other symptoms. No waking up in excruciating pain when experiencing night sweats.       Pharmacotherapy Assessment  Analgesic: Oxycodone 5 mg every 6-8 hours as needed     Monitoring:  [place] PMP: PDMP reviewed during this encounter.       Pharmacotherapy: No side-effects or adverse reactions reported. Compliance: No problems identified. Effectiveness: Clinically acceptable.  Laura Mattes, RN  07/23/2023  10:28 AM  Sign when Signing Visit Nursing Pain Medication Assessment:  Safety precautions to be maintained throughout the outpatient stay will include: orient to surroundings, keep bed in low position, maintain call bell within reach at all times, provide assistance with transfer out of bed and ambulation.  Medication Inspection Compliance: Pill count conducted under aseptic conditions, in front of the patient. Neither the pills nor the bottle was removed from the patient's sight at any time. Once count was completed pills were immediately returned to the patient in their original bottle.  Medication: Oxycodone/APAP Pill/Patch Count:  45 of 105 pills remain Pill/Patch Appearance: Markings consistent with prescribed medication Bottle Appearance: Standard pharmacy container. Clearly labeled. Filled Date: 02 / 04 / 2025 Last Medication intake:  Today  No results found for: "CBDTHCR" No results found for: "D8THCCBX" No results found for: "D9THCCBX"  UDS:  Summary  Date Value Ref Range Status  12/26/2021 Note  Final    Comment:    ==================================================================== Compliance Drug Analysis, Ur ==================================================================== Test                             Result       Flag       Units  Drug Present and Declared for Prescription Verification   Oxycodone                      1083         EXPECTED   ng/mg creat   Oxymorphone                    1317         EXPECTED   ng/mg creat   Noroxycodone                   1906         EXPECTED   ng/mg creat   Noroxymorphone                 406          EXPECTED   ng/mg creat    Sources of oxycodone are scheduled prescription medications.    Oxymorphone, noroxycodone, and noroxymorphone are expected    metabolites of oxycodone. Oxymorphone is also available as a    scheduled prescription medication.    Baclofen                       PRESENT      EXPECTED   Acetaminophen                   PRESENT      EXPECTED   Metoprolol                     PRESENT      EXPECTED  Drug Absent but Declared for Prescription Verification   Methocarbamol                  Not Detected UNEXPECTED   Naproxen                       Not Detected UNEXPECTED ==================================================================== Test  Result    Flag   Units      Ref Range   Creatinine              66               mg/dL      >=78 ==================================================================== Declared Medications:  The flagging and interpretation on this report are based on the  following declared medications.  Unexpected results may arise from  inaccuracies in the declared medications.   **Note: The testing scope of this panel includes these medications:   Baclofen (Lioresal)  Methocarbamol (Robaxin)  Metoprolol (Lopressor)  Naproxen (Naprosyn)  Oxycodone (Percocet)   **Note: The testing scope of this panel does not include small to  moderate amounts of these reported medications:   Acetaminophen (Percocet)   **Note: The testing scope of this panel does not include the  following reported medications:   Alendronate (Fosamax)  Amlodipine (Norvasc)  Ipratropium (Atrovent)  Levothyroxine (Synthroid)  Ondansetron (Zofran)  Pantoprazole (Protonix) ==================================================================== For clinical consultation, please call [phone]. ====================================================================       ROS  Constitutional: Denies any fever or chills Gastrointestinal: No reported hemesis, hematochezia, vomiting, or acute GI distress Musculoskeletal:  thoracic and lumbar spine pain Neurological: No reported episodes of acute onset apraxia, aphasia, dysarthria, agnosia, amnesia, paralysis, loss of coordination, or loss of consciousness  Medication Review  amLODipine, levothyroxine, metoprolol tartrate, ondansetron,  oxyCODONE-acetaminophen, pantoprazole, and polyethylene glycol  History Review  Allergy: Laura Cole is allergic to mushroom extract complex (obsolete), promethazine, alprazolam, cefdinir, ciprofloxacin, dilaudid [hydromorphone hcl], hydromorphone, phenergan [promethazine hcl], pravastatin, and augmentin [amoxicillin-pot clavulanate]. Drug: Laura Cole  reports no history of drug use. Alcohol:  reports that she does not currently use alcohol. Tobacco:  reports that she has never smoked. She has never used smokeless tobacco. Social: Laura Cole  reports that she has never smoked. She has never used smokeless tobacco. She reports that she does not currently use alcohol. She reports that she does not use drugs. Medical:  has a past medical history of Bradycardia, Cancer (HCC), Coronary artery disease, Diverticulitis, GERD (gastroesophageal reflux disease), Heart murmur, Hemorrhoids, Hypertension, Hypothyroidism, Osteoarthritis, Peripheral vascular disease (HCC), and TIA (transient ischemic attack). Surgical: Laura Cole  has a past surgical history that includes Abdominal hysterectomy; Rectocele repair; Cholecystectomy; Wrist surgery; Replacement total knee (Bilateral); Back surgery; TKRX2 (Bilateral); Eye surgery; Rectocele repair; Colon surgery; Colonoscopy with propofol (N/A, 12/05/2019); Esophagogastroduodenoscopy (egd) with propofol (N/A, 12/05/2019); IR Radiologist Eval & Mgmt (07/30/2021); IR KYPHO LUMBAR INC FX REDUCE BONE BX UNI/BIL CANNULATION INC/IMAGING (08/12/2021); IR Radiologist Eval & Mgmt (09/05/2021); and IR Radiologist Eval & Mgmt (09/24/2021). Family: family history includes Breast cancer (age of onset: 17) in her sister.  Laboratory Chemistry Profile   Renal Lab Results  Component Value Date   BUN 14 08/12/2021   CREATININE 0.91 08/12/2021   GFRAA >60 04/20/2018   GFRNONAA >60 08/12/2021    Hepatic Lab Results  Component Value Date   AST 22 05/25/2021   ALT 15 05/25/2021   ALBUMIN 3.7  05/25/2021   ALKPHOS 72 05/25/2021   LIPASE 68 (H) 04/20/2018    Electrolytes Lab Results  Component Value Date   NA 140 08/12/2021   K 3.7 08/12/2021   CL 106 08/12/2021   CALCIUM 10.3 08/12/2021   MG 2.1 05/26/2021    Bone No results found for: "VD25OH", "VD125OH2TOT", "XL2440NU2", "VO5366YQ0", "25OHVITD1", "25OHVITD2", "25OHVITD3", "TESTOFREE", "TESTOSTERONE"  Inflammation (CRP: Acute Phase) (ESR: Chronic Phase) No results found for: "  CRP", "ESRSEDRATE", "LATICACIDVEN"       Note: Above Lab results reviewed.  Recent Imaging Review  MM 3D SCREENING MAMMOGRAM BILATERAL BREAST CLINICAL DATA:  Screening.  EXAM: DIGITAL SCREENING BILATERAL MAMMOGRAM WITH TOMOSYNTHESIS AND CAD  TECHNIQUE: Bilateral screening digital craniocaudal and mediolateral oblique mammograms were obtained. Bilateral screening digital breast tomosynthesis was performed. The images were evaluated with computer-aided detection.  COMPARISON:  Previous exam(s).  ACR Breast Density Category b: There are scattered areas of fibroglandular density.  FINDINGS: There are no findings suspicious for malignancy.  IMPRESSION: No mammographic evidence of malignancy. A result letter of this screening mammogram will be mailed directly to the patient.  RECOMMENDATION: Screening mammogram in one year. (Code:SM-B-01Y)  BI-RADS CATEGORY  1: Negative.  Electronically Signed   By: Frederico Hamman M.D.   On: 03/06/2023 13:31 Note: Reviewed        Physical Exam  General appearance: Well nourished, well developed, and well hydrated. In no apparent acute distress Mental status: Alert, oriented x 3 (person, place, & time)       Respiratory: No evidence of acute respiratory distress Eyes: PERLA Vitals: BP (!) 147/80   Pulse 90   Temp (!) 96.6 F (35.9 C)   Resp 16   Ht 5\' 1"  (1.549 m)   Wt 151 lb (68.5 kg)   LMP  (LMP Unknown)   SpO2 98%   BMI 28.53 kg/m  BMI: Estimated body mass index is 28.53 kg/m as  calculated from the following:   Height as of this encounter: 5\' 1"  (1.549 m).   Weight as of this encounter: 151 lb (68.5 kg). Ideal: Ideal body weight: 47.8 kg (105 lb 6.1 oz) Adjusted ideal body weight: 56.1 kg (123 lb 10 oz)  Assessment   Diagnosis  1. Arthropathy of thoracic facet joint   2. Chronic pain syndrome   3. Spinal stenosis, lumbar region, with neurogenic claudication   4. Compression fracture of T8 vertebra with delayed healing, subsequent encounter   5. Spinal stenosis, thoracic   6. Encounter for long-term opiate analgesic use      Updated Problems: No problems updated.  Plan of Care  Problem-specific:  Assessment and Plan    Chronic pain management   She is on a pain management regimen that includes opioids, with concerns about surplus medication due to extra pills each month. She manages her medication responsibly and understands the risks of dependence and tolerance. Significant pain occurs if medication is not taken as prescribed. Maintain the current prescription regimen. Schedule appointments based on medication supply, specifically when down to 20-30 pills, with the next refill scheduled for July 31, 2023. Educate her on the importance of taking opioids as needed to avoid tolerance and dependence. Ensure she contacts the clinic when medication supply is low for timely appointments.  Rib fracture   A suspected rib fracture on the left side occurred approximately one month ago while getting out of bed. She reported hearing and feeling something at the time of injury, with significant pain. No medical evaluation has been conducted due to a decision to avoid unnecessary doctor visits unless symptoms worsen. Seek medical evaluation if pain worsens or does not improve.  Jaw pain   Severe jaw pain has altered her dietary habits, such as avoiding nuts and certain cookies. She is concerned about the potential for a jaw fracture. Seek dental or medical evaluation if  pain persists or worsens.  Night sweats   Intermittent night sweats occur without associated pain. She  is unsure of the cause but does not believe it is related to pain medication. It is suggested that the night sweats may be related to temperature regulation rather than opioid use. Report any changes, especially if associated with pain.       Laura Cole has a current medication list which includes the following long-term medication(s): amlodipine, levothyroxine, metoprolol tartrate, and pantoprazole.  Pharmacotherapy (Medications Ordered): Meds ordered this encounter  Medications   oxyCODONE-acetaminophen (PERCOCET) 5-325 MG tablet    Sig: Take 1-1.5 tablets by mouth every 8 (eight) hours as needed for severe pain (pain score 7-10). Must last 30 days.    Dispense:  105 tablet    Refill:  0    Chronic Pain: STOP Act (Not applicable) Fill 1 day early if closed on refill date. Avoid benzodiazepines within 8 hours of opioids   oxyCODONE-acetaminophen (PERCOCET) 5-325 MG tablet    Sig: Take 1-1.5 tablets by mouth every 8 (eight) hours as needed for severe pain (pain score 7-10). Must last 30 days.    Dispense:  105 tablet    Refill:  0    Chronic Pain: STOP Act (Not applicable) Fill 1 day early if closed on refill date. Avoid benzodiazepines within 8 hours of opioids   oxyCODONE-acetaminophen (PERCOCET) 5-325 MG tablet    Sig: Take 1-1.5 tablets by mouth every 8 (eight) hours as needed for severe pain (pain score 7-10). Must last 30 days.    Dispense:  105 tablet    Refill:  0    Chronic Pain: STOP Act (Not applicable) Fill 1 day early if closed on refill date. Avoid benzodiazepines within 8 hours of opioids   Orders:  No orders of the defined types were placed in this encounter.  Follow-up plan:   Return for patient will call to schedule F2F appt prn.     Recent Visits No visits were found meeting these conditions. Showing recent visits within past 90 days and meeting all other  requirements Today's Visits Date Type Provider Dept  07/23/23 Office Visit Edward Jolly, MD [facility]  Showing today's visits and meeting all other requirements Future Appointments No visits were found meeting these conditions. Showing future appointments within next 90 days and meeting all other requirements  I discussed the assessment and treatment plan with the patient. The patient was provided an opportunity to ask questions and all were answered. The patient agreed with the plan and demonstrated an understanding of the instructions.  Patient advised to call back or seek an in-person evaluation if the symptoms or condition worsens.  Duration of encounter: .  Total time on encounter, as per AMA guidelines included both the face-to-face and non-face-to-face time personally spent by the physician and/or other qualified health care professional(s) on the day of the encounter (includes time in activities that require the physician or other qualified health care professional and does not include time in activities normally performed by clinical staff). Physician's time may include the following activities when performed: Preparing to see the patient (e.g., pre-charting review of records, searching for previously ordered imaging, lab work, and nerve conduction tests) Review of prior analgesic pharmacotherapies. Reviewing PMP Interpreting ordered tests (e.g., lab work, imaging, nerve conduction tests) Performing post-procedure evaluations, including interpretation of diagnostic procedures Obtaining and/or reviewing separately obtained history Performing a medically appropriate examination and/or evaluation Counseling and educating the patient/family/caregiver Ordering medications, tests, or procedures Referring and communicating with other health care professionals (when not separately reported) Documenting clinical information in  the electronic or other health  record Independently interpreting results (not separately reported) and communicating results to the patient/ family/caregiver Care coordination (not separately reported)  Note by: Edward Jolly, MD Date: 07/23/2023; Time: 12:46 PM

## 2023-07-23 NOTE — Progress Notes (Signed)
 Nursing Pain Medication Assessment:  Safety precautions to be maintained throughout the outpatient stay will include: orient to surroundings, keep bed in low position, maintain call bell within reach at all times, provide assistance with transfer out of bed and ambulation.  Medication Inspection Compliance: Pill count conducted under aseptic conditions, in front of the patient. Neither the pills nor the bottle was removed from the patient's sight at any time. Once count was completed pills were immediately returned to the patient in their original bottle.  Medication: Oxycodone/APAP Pill/Patch Count:  45 of 105 pills remain Pill/Patch Appearance: Markings consistent with prescribed medication Bottle Appearance: Standard pharmacy container. Clearly labeled. Filled Date: 02 / 04 / 2025 Last Medication intake:  Today

## 2023-09-14 NOTE — Progress Notes (Unsigned)
 PROVIDER NOTE: Interpretation of information contained herein should be left to medically-trained personnel. Specific patient instructions are provided elsewhere under "Patient Instructions" section of medical record. This document was created in part using AI and STT-dictation technology, any transcriptional errors that may result from this process are unintentional.  Patient: Laura Cole  Service: E/M   PCP: Little Riff, MD  DOB: 08/26/1940  DOS: 09/15/2023  Provider: Cherylin Corrigan, NP  MRN: [id number]  Delivery: Face-to-face  Specialty: Interventional Pain Management  Type: Established Patient  Setting: Ambulatory outpatient facility  Specialty designation: 09  Referring Prov.: Little Riff, MD  Location: Outpatient office facility       HPI  Laura Cole, a 83 y.o. year old female, is here today because of her Chronic pain syndrome [G89.4]. Laura Cole primary complain today is Abdominal Pain, Shoulder Pain (right), Wrist Pain (bilateral), and Foot Pain (left)  Pertinent problems: Laura Cole does not have any pertinent problems on file. Pain Assessment: Severity of Chronic pain is reported as a 9 /10. Location: Back Upper/ . Onset: More than a month ago. Quality: Aching. Timing: Constant. Modifying factor(s): Oxycodone , sitting. Vitals:  height is 5\' 1"  (1.549 m) and weight is 154 lb (69.9 kg). Her temporal temperature is 96.1 F (35.6 C) (abnormal). Her blood pressure is 126/78 and her pulse is 57 (abnormal). Her respiration is 16 and oxygen saturation is 97%.  BMI: Estimated body mass index is 29.1 kg/m as calculated from the following:   Height as of this encounter: 5\' 1"  (1.549 m).   Weight as of this encounter: 154 lb (69.9 kg). Last encounter: 07/23/2023 Last procedure: Visit date not found.  Reason for encounter: medication management. No change in medical history since last visit.  Patient's pain is at baseline.  Patient continues multimodal pain regimen as prescribed.   States that it provides pain relief and improvement in functional status.  Patient reports generalized pain, but states that the current pain regimen provides relief. Pharmacotherapy Assessment  Analgesic: Oxycodone -acetaminophen  (Percocet) 5-225 mg tablet every 8 hours as needed for severe pain. MME= 26.25 Monitoring: [place] PMP: PDMP reviewed during this encounter.       Pharmacotherapy: No side-effects or adverse reactions reported. Compliance: No problems identified. Effectiveness: Clinically acceptable.  Calton Catholic, RN  09/15/2023 11:11 AM  Sign when Signing Visit Nursing Pain Medication Assessment:  Safety precautions to be maintained throughout the outpatient stay will include: orient to surroundings, keep bed in low position, maintain call bell within reach at all times, provide assistance with transfer out of bed and ambulation.  Medication Inspection Compliance: Laura Cole did not comply with our request to bring her pills to be counted. She was reminded that bringing the medication bottles, even when empty, is a requirement.  Medication: None brought in. Pill/Patch Count: None available to be counted. Bottle Appearance: No container available. Did not bring bottle(s) to appointment. Filled Date: N/A Last Medication intake:  Today    No results found for: "CBDTHCR" No results found for: "D8THCCBX" No results found for: "D9THCCBX"  UDS:  Summary  Date Value Ref Range Status  12/26/2021 Note  Final    Comment:    ==================================================================== Compliance Drug Analysis, Ur ==================================================================== Test                             Result       Flag       Units  Drug  Present and Declared for Prescription Verification   Oxycodone                       1083         EXPECTED   ng/mg creat   Oxymorphone                    1317         EXPECTED   ng/mg creat   Noroxycodone                   1906          EXPECTED   ng/mg creat   Noroxymorphone                 406          EXPECTED   ng/mg creat    Sources of oxycodone  are scheduled prescription medications.    Oxymorphone, noroxycodone, and noroxymorphone are expected    metabolites of oxycodone . Oxymorphone is also available as a    scheduled prescription medication.    Baclofen                        PRESENT      EXPECTED   Acetaminophen                   PRESENT      EXPECTED   Metoprolol                      PRESENT      EXPECTED  Drug Absent but Declared for Prescription Verification   Methocarbamol                   Not Detected UNEXPECTED   Naproxen                        Not Detected UNEXPECTED ==================================================================== Test                      Result    Flag   Units      Ref Range   Creatinine              66               mg/dL      >=88 ==================================================================== Declared Medications:  The flagging and interpretation on this report are based on the  following declared medications.  Unexpected results may arise from  inaccuracies in the declared medications.   **Note: The testing scope of this panel includes these medications:   Baclofen  (Lioresal )  Methocarbamol  (Robaxin )  Metoprolol  (Lopressor )  Naproxen  (Naprosyn )  Oxycodone  (Percocet)   **Note: The testing scope of this panel does not include small to  moderate amounts of these reported medications:   Acetaminophen  (Percocet)   **Note: The testing scope of this panel does not include the  following reported medications:   Alendronate (Fosamax)  Amlodipine  (Norvasc )  Ipratropium (Atrovent)  Levothyroxine  (Synthroid )  Ondansetron  (Zofran )  Pantoprazole  (Protonix ) ==================================================================== For clinical consultation, please call [phone]. ====================================================================       ROS   Constitutional: Denies any fever or chills Gastrointestinal: No reported hemesis, hematochezia, vomiting, or acute GI distress Musculoskeletal: Denies any acute onset joint swelling, redness, loss of ROM, or weakness Neurological: No reported episodes of acute onset apraxia, aphasia, dysarthria, agnosia, amnesia, paralysis, loss of coordination, or loss of consciousness  Medication Review  amLODipine , levothyroxine , metoprolol  tartrate, ondansetron , oxyCODONE -acetaminophen , pantoprazole , and polyethylene glycol  History Review  Allergy: Ms. Bureau is allergic to mushroom extract complex (obsolete), promethazine, alprazolam, cefdinir, ciprofloxacin, dilaudid [hydromorphone hcl], hydromorphone, phenergan [promethazine hcl], pravastatin, and augmentin [amoxicillin-pot clavulanate]. Drug: Laura Cole  reports no history of drug use. Alcohol:  reports that she does not currently use alcohol. Tobacco:  reports that she has never smoked. She has never used smokeless tobacco. Social: Laura Cole  reports that she has never smoked. She has never used smokeless tobacco. She reports that she does not currently use alcohol. She reports that she does not use drugs. Medical:  has a past medical history of Bradycardia, Cancer (HCC), Coronary artery disease, Diverticulitis, GERD (gastroesophageal reflux disease), Heart murmur, Hemorrhoids, Hypertension, Hypothyroidism, Osteoarthritis, Peripheral vascular disease (HCC), and TIA (transient ischemic attack). Surgical: Laura Cole  has a past surgical history that includes Abdominal hysterectomy; Rectocele repair; Cholecystectomy; Wrist surgery; Replacement total knee (Bilateral); Back surgery; TKRX2 (Bilateral); Eye surgery; Rectocele repair; Colon surgery; Colonoscopy with propofol  (N/A, 12/05/2019); Esophagogastroduodenoscopy (egd) with propofol  (N/A, 12/05/2019); IR Radiologist Eval & Mgmt (07/30/2021); IR KYPHO LUMBAR INC FX REDUCE BONE BX UNI/BIL CANNULATION INC/IMAGING  (08/12/2021); IR Radiologist Eval & Mgmt (09/05/2021); and IR Radiologist Eval & Mgmt (09/24/2021). Family: family history includes Breast cancer (age of onset: 70) in her sister.  Laboratory Chemistry Profile   Renal Lab Results  Component Value Date   BUN 14 08/12/2021   CREATININE 0.91 08/12/2021   GFRAA >60 04/20/2018   GFRNONAA >60 08/12/2021    Hepatic Lab Results  Component Value Date   AST 22 05/25/2021   ALT 15 05/25/2021   ALBUMIN 3.7 05/25/2021   ALKPHOS 72 05/25/2021   LIPASE 68 (H) 04/20/2018    Electrolytes Lab Results  Component Value Date   NA 140 08/12/2021   K 3.7 08/12/2021   CL 106 08/12/2021   CALCIUM 10.3 08/12/2021   MG 2.1 05/26/2021    Bone No results found for: "VD25OH", "VD125OH2TOT", "VQ2595GL8", "VF6433IR5", "25OHVITD1", "25OHVITD2", "25OHVITD3", "TESTOFREE", "TESTOSTERONE"  Inflammation (CRP: Acute Phase) (ESR: Chronic Phase) No results found for: "CRP", "ESRSEDRATE", "LATICACIDVEN"       Note: Above Lab results reviewed.  Recent Imaging Review  MM 3D SCREENING MAMMOGRAM BILATERAL BREAST CLINICAL DATA:  Screening.  EXAM: DIGITAL SCREENING BILATERAL MAMMOGRAM WITH TOMOSYNTHESIS AND CAD  TECHNIQUE: Bilateral screening digital craniocaudal and mediolateral oblique mammograms were obtained. Bilateral screening digital breast tomosynthesis was performed. The images were evaluated with computer-aided detection.  COMPARISON:  Previous exam(s).  ACR Breast Density Category b: There are scattered areas of fibroglandular density.  FINDINGS: There are no findings suspicious for malignancy.  IMPRESSION: No mammographic evidence of malignancy. A result letter of this screening mammogram will be mailed directly to the patient.  RECOMMENDATION: Screening mammogram in one year. (Code:SM-B-01Y)  BI-RADS CATEGORY  1: Negative.  Electronically Signed   By: Alinda Apley M.D.   On: 03/06/2023 13:31 Note: Reviewed        Physical Exam   General appearance: Well nourished, well developed, and well hydrated. In no apparent acute distress Mental status: Alert, oriented x 3 (person, place, & time)       Respiratory: No evidence of acute respiratory distress Eyes: PERLA Vitals: BP 126/78 (Cuff Size: Normal)   Pulse (!) 57   Temp (!) 96.1 F (35.6 C) (Temporal)   Resp 16   Ht 5\' 1"  (1.549 m)   Wt 154 lb (69.9 kg)   LMP  (  LMP Unknown)   SpO2 97%   BMI 29.10 kg/m  BMI: Estimated body mass index is 29.1 kg/m as calculated from the following:   Height as of this encounter: 5\' 1"  (1.549 m).   Weight as of this encounter: 154 lb (69.9 kg). Ideal: Ideal body weight: 47.8 kg (105 lb 6.1 oz) Adjusted ideal body weight: 56.6 kg (124 lb 13.2 oz)  Assessment   Diagnosis Status  1. Chronic pain syndrome   2. Arthropathy of thoracic facet joint   3. Spinal stenosis, lumbar region, with neurogenic claudication   4. Medication management   5. Spinal stenosis, thoracic   6. Encounter for long-term opiate analgesic use    Controlled Controlled Controlled   Updated Problems: No problems updated.  Plan of Care  Problem-specific:  Assessment and Plan Patient has an adequate supply of pain medication, as she is taking it on as needed basis and has 1 prescription refill available at the pharmacy.  No additional medication is needed at this visit. Routine UDS ordered today.  Prescription drug monitoring (PDMP) reviewed this visit.   Laura Cole has a current medication list which includes the following long-term medication(s): amlodipine , levothyroxine , metoprolol  tartrate, and pantoprazole .  Pharmacotherapy (Medications Ordered): No orders of the defined types were placed in this encounter.  Orders:  Orders Placed This Encounter  Procedures   ToxASSURE Select 13 (MW), Urine    Volume: 30 ml(s). Minimum 3 ml of urine is needed. Document temperature of fresh sample. Indications: Long term (current) use of opiate  analgesic (U04.540)    Release to patient:   Immediate   Follow-up plan:   Return in about 7 weeks (around 11/03/2023) for (F2F), (MM), Marthe Slain NP.        Recent Visits Date Type Provider Dept  07/23/23 Office Visit Cephus Collin, MD [facility]  Showing recent visits within past 90 days and meeting all other requirements Today's Visits Date Type Provider Dept  09/15/23 Office Visit Kimyata Milich K, NP [facility]  Showing today's visits and meeting all other requirements Future Appointments No visits were found meeting these conditions. Showing future appointments within next 90 days and meeting all other requirements  I discussed the assessment and treatment plan with the patient. The patient was provided an opportunity to ask questions and all were answered. The patient agreed with the plan and demonstrated an understanding of the instructions.  Patient advised to call back or seek an in-person evaluation if the symptoms or condition worsens.  Duration of encounter: 30 minutes.  Total time on encounter, as per AMA guidelines included both the face-to-face and non-face-to-face time personally spent by the physician and/or other qualified health care professional(s) on the day of the encounter (includes time in activities that require the physician or other qualified health care professional and does not include time in activities normally performed by clinical staff). Physician's time may include the following activities when performed: Preparing to see the patient (e.g., pre-charting review of records, searching for previously ordered imaging, lab work, and nerve conduction tests) Review of prior analgesic pharmacotherapies. Reviewing PMP Interpreting ordered tests (e.g., lab work, imaging, nerve conduction tests) Performing post-procedure evaluations, including interpretation of diagnostic procedures Obtaining and/or reviewing separately obtained  history Performing a medically appropriate examination and/or evaluation Counseling and educating the patient/family/caregiver Ordering medications, tests, or procedures Referring and communicating with other health care professionals (when not separately reported) Documenting clinical information in the electronic or other health record Independently interpreting  results (not separately reported) and communicating results to the patient/ family/caregiver Care coordination (not separately reported)  Note by: Gaege Sangalang K Ted Goodner, NP (TTS and AI technology used. I apologize for any typographical errors that were not detected and corrected.) Date: 09/15/2023; Time: 11:40 AM

## 2023-09-15 ENCOUNTER — Ambulatory Visit: Attending: Student in an Organized Health Care Education/Training Program | Admitting: Nurse Practitioner

## 2023-09-15 ENCOUNTER — Encounter: Payer: Self-pay | Admitting: Nurse Practitioner

## 2023-09-15 VITALS — BP 126/78 | HR 57 | Temp 96.1°F | Resp 16 | Ht 61.0 in | Wt 154.0 lb

## 2023-09-15 DIAGNOSIS — M48062 Spinal stenosis, lumbar region with neurogenic claudication: Secondary | ICD-10-CM | POA: Diagnosis not present

## 2023-09-15 DIAGNOSIS — G894 Chronic pain syndrome: Secondary | ICD-10-CM | POA: Diagnosis present

## 2023-09-15 DIAGNOSIS — Z79891 Long term (current) use of opiate analgesic: Secondary | ICD-10-CM | POA: Diagnosis present

## 2023-09-15 DIAGNOSIS — Z79899 Other long term (current) drug therapy: Secondary | ICD-10-CM | POA: Diagnosis present

## 2023-09-15 DIAGNOSIS — M47814 Spondylosis without myelopathy or radiculopathy, thoracic region: Secondary | ICD-10-CM | POA: Diagnosis present

## 2023-09-15 DIAGNOSIS — M4804 Spinal stenosis, thoracic region: Secondary | ICD-10-CM | POA: Insufficient documentation

## 2023-09-15 NOTE — Progress Notes (Signed)
 Nursing Pain Medication Assessment:  Safety precautions to be maintained throughout the outpatient stay will include: orient to surroundings, keep bed in low position, maintain call bell within reach at all times, provide assistance with transfer out of bed and ambulation.  Medication Inspection Compliance: Laura Cole did not comply with our request to bring her pills to be counted. She was reminded that bringing the medication bottles, even when empty, is a requirement.  Medication: None brought in. Pill/Patch Count: None available to be counted. Bottle Appearance: No container available. Did not bring bottle(s) to appointment. Filled Date: N/A Last Medication intake:  Today

## 2023-09-19 LAB — TOXASSURE SELECT 13 (MW), URINE

## 2023-10-13 ENCOUNTER — Telehealth: Payer: Self-pay | Admitting: Student in an Organized Health Care Education/Training Program

## 2023-10-13 NOTE — Telephone Encounter (Signed)
 Explained to patient that Seema would be doing medication management.  Patinet states that she just didn't want to have to to explain everything again.  Informed her that Dr Rhesa Celeste would still be her physician and Cordell Destine would be interacting wioth him in her care.  Patient states understanding and will notify us  if there are any problems.

## 2023-10-13 NOTE — Telephone Encounter (Signed)
 Patient is not happy with being changed to NP for her appts. She wants to see only Dr Rhesa Celeste.

## 2023-11-03 ENCOUNTER — Ambulatory Visit: Attending: Nurse Practitioner | Admitting: Nurse Practitioner

## 2023-11-03 ENCOUNTER — Encounter: Payer: Self-pay | Admitting: Nurse Practitioner

## 2023-11-03 VITALS — BP 140/52 | HR 52 | Temp 97.5°F | Resp 18 | Ht 61.0 in | Wt 159.0 lb

## 2023-11-03 DIAGNOSIS — M48062 Spinal stenosis, lumbar region with neurogenic claudication: Secondary | ICD-10-CM | POA: Diagnosis present

## 2023-11-03 DIAGNOSIS — Z79899 Other long term (current) drug therapy: Secondary | ICD-10-CM | POA: Diagnosis present

## 2023-11-03 DIAGNOSIS — Z79891 Long term (current) use of opiate analgesic: Secondary | ICD-10-CM | POA: Diagnosis present

## 2023-11-03 DIAGNOSIS — G894 Chronic pain syndrome: Secondary | ICD-10-CM | POA: Insufficient documentation

## 2023-11-03 DIAGNOSIS — S32020S Wedge compression fracture of second lumbar vertebra, sequela: Secondary | ICD-10-CM | POA: Diagnosis present

## 2023-11-03 DIAGNOSIS — M4804 Spinal stenosis, thoracic region: Secondary | ICD-10-CM | POA: Insufficient documentation

## 2023-11-03 DIAGNOSIS — Z9889 Other specified postprocedural states: Secondary | ICD-10-CM | POA: Diagnosis present

## 2023-11-03 DIAGNOSIS — M47814 Spondylosis without myelopathy or radiculopathy, thoracic region: Secondary | ICD-10-CM

## 2023-11-03 DIAGNOSIS — S22060G Wedge compression fracture of T7-T8 vertebra, subsequent encounter for fracture with delayed healing: Secondary | ICD-10-CM | POA: Diagnosis present

## 2023-11-03 MED ORDER — OXYCODONE-ACETAMINOPHEN 5-325 MG PO TABS: 1.0000 | ORAL_TABLET | Freq: Four times a day (QID) | ORAL | 0 refills | Status: AC | PRN

## 2023-11-03 MED ORDER — OXYCODONE-ACETAMINOPHEN 5-325 MG PO TABS: 1.0000 | ORAL_TABLET | Freq: Four times a day (QID) | ORAL | 0 refills | Status: DC | PRN

## 2023-11-03 NOTE — Progress Notes (Signed)
 PROVIDER NOTE: Interpretation of information contained herein should be left to medically-trained personnel. Specific patient instructions are provided elsewhere under Patient Instructions section of medical record. This document was created in part using AI and STT-dictation technology, any transcriptional errors that may result from this process are unintentional.  Patient: Laura Cole  Service: E/M   PCP: Little Riff, MD  DOB: 03-26-1941  DOS: 11/03/2023  Provider: Cherylin Corrigan, NP  MRN: [id number]  Delivery: Face-to-face  Specialty: Interventional Pain Management  Type: Established Patient  Setting: Ambulatory outpatient facility  Specialty designation: 09  Referring Prov.: Little Riff, MD  Location: Outpatient office facility       History of present illness (HPI) [name], a 83 y.o. year old female, is here today because of her Chronic pain syndrome [G89.4]. [name] primary complain today is Hip Pain, Back Pain, and Shoulder Pain  Pertinent problems: [name] has Spinal stenosis, Lumbar region with neurogenic claudication; Chronic thoracic back pain; Closed fracture of dorsal (thoracic) vertebra (HCC); Compression fracture of thoracic vertebra with delayed healing; history of Kyphoplasty; Compression fracture of L2 lumbar vertebrae (HCC); Lumbar degenerative disc disease; Encounter for long-term opioid analgesics use; and Chronic pain syndrome on their pertinent problem list.  Pain Assessment: Severity of Chronic pain is reported as a 8 /10. Location: Back Mid, Right, Left/Radiadtes from mid back into lower back into hips bialteral. Onset: More than a month ago. Quality: Constant, Aching, Throbbing, Dull. Timing: Constant. Modifying factor(s): Oxycodone  and sitting. Vitals:  height is 5' 1 (1.549 m) and weight is 159 lb (72.1 kg). Her temporal temperature is 97.5 F (36.4 C) (abnormal). Her blood pressure is 140/52 (abnormal) and her pulse is 52 (abnormal). Her respiration  is 18 and oxygen saturation is 99%.  BMI: Estimated body mass index is 30.04 kg/m as calculated from the following:   Height as of this encounter: 5' 1 (1.549 m).   Weight as of this encounter: 159 lb (72.1 kg).  Last encounter: 09/15/2023. Last procedure: Visit date not found.  Reason for encounter: medication management. No change in medical history since last visit.  Patient's pain is at baseline.  Patient continues multimodal pain regimen as prescribed.  States that it provides pain relief and improvement in functional status.   The patient reports increased pain during the daytime, noting that their current hydrocodone-acetaminophen  5-325 mg regimen every 8 hours wears off after approximately 6 hours, leading to increased pain between doses.  I advised the patient that I can temporarily adjust the dosing frequency to every 6 hours for short-term use.  After this.  The patient will return to the previous dosing schedule of every 8 hours.  Pharmacotherapy Assessment   Analgesic: Oxycodone -acetaminophen  (Percocet) 5-325 mg tablet every 6 hours as needed for severe pain. MME= 26.25 Monitoring: [place] PMP: PDMP reviewed during this encounter.       Pharmacotherapy: No side-effects or adverse reactions reported. Compliance: No problems identified. Effectiveness: Clinically acceptable.  Huston Maiers, RN  11/03/2023 10:08 AM  Sign when Signing Visit Nursing Pain Medication Assessment:  Safety precautions to be maintained throughout the outpatient stay will include: orient to surroundings, keep bed in low position, maintain call bell within reach at all times, provide assistance with transfer out of bed and ambulation.   Medication Inspection Compliance: Pill count conducted under aseptic conditions, in front of the patient. Neither the pills nor the bottle was removed from the patient's sight at any time. Once count was completed pills  were immediately returned to the patient in their original  bottle.  Medication: Oxycodone /APAP Pill/Patch Count: 18 of 105 pills/patches remain Pill/Patch Appearance: Markings consistent with prescribed medication Bottle Appearance: Standard pharmacy container. Clearly labeled. Filled Date: 05 / 18 / 2025 Last Medication intake:  Today    UDS:  Summary  Date Value Ref Range Status  09/15/2023 FINAL  Final    Comment:    ==================================================================== [employer] [facility] (MW) ==================================================================== Test                             Result       Flag       Units  Drug Present and Declared for Prescription Verification   Oxycodone                       2078         EXPECTED   ng/mg creat   Oxymorphone                    1612         EXPECTED   ng/mg creat   Noroxycodone                   2318         EXPECTED   ng/mg creat   Noroxymorphone                 644          EXPECTED   ng/mg creat    Sources of oxycodone  are scheduled prescription medications.    Oxymorphone, noroxycodone, and noroxymorphone are expected    metabolites of oxycodone . Oxymorphone is also available as a    scheduled prescription medication.  ==================================================================== Test                      Result    Flag   Units      Ref Range   Creatinine              77               mg/dL      >=16 ==================================================================== Declared Medications:  The flagging and interpretation on this report are based on the  following declared medications.  Unexpected results may arise from  inaccuracies in the declared medications.   **Note: The testing scope of this panel includes these medications:   Oxycodone  (Percocet)   **Note: The testing scope of this panel does not include the  following reported medications:   Acetaminophen  (Percocet)  Amlodipine  (Norvasc )  Levothyroxine  (Synthroid )  Metoprolol   (Lopressor )  Ondansetron  (Zofran )  Pantoprazole  (Protonix )  Polyethylene Glycol (MiraLAX) ==================================================================== For clinical consultation, please call [phone]. ====================================================================     No results found for: CBDTHCR No results found for: D8THCCBX No results found for: D9THCCBX  ROS  Constitutional: Denies any fever or chills Gastrointestinal: No reported hemesis, hematochezia, vomiting, or acute GI distress Musculoskeletal: Lower back pain, hip pain, shoulder pain Neurological: No reported episodes of acute onset apraxia, aphasia, dysarthria, agnosia, amnesia, paralysis, loss of coordination, or loss of consciousness  Medication Review  amLODipine , levothyroxine , metoprolol  tartrate, ondansetron , oxyCODONE -acetaminophen , pantoprazole , and polyethylene glycol  History Review  Allergy: Ms. Tuff is allergic to mushroom extract complex (obsolete), promethazine, alprazolam, cefdinir, ciprofloxacin, dilaudid [hydromorphone hcl], hydromorphone, phenergan [promethazine hcl], pravastatin, and augmentin [amoxicillin-pot clavulanate]. Drug: Ms. Wallis  reports no history  of drug use. Alcohol:  reports that she does not currently use alcohol. Tobacco:  reports that she has never smoked. She has never used smokeless tobacco. Social: Ms. Shreiner  reports that she has never smoked. She has never used smokeless tobacco. She reports that she does not currently use alcohol. She reports that she does not use drugs. Medical:  has a past medical history of Bradycardia, Cancer (HCC), Coronary artery disease, Diverticulitis, GERD (gastroesophageal reflux disease), Heart murmur, Hemorrhoids, Hypertension, Hypothyroidism, Osteoarthritis, Peripheral vascular disease (HCC), and TIA (transient ischemic attack). Surgical: Ms. Broyles  has a past surgical history that includes Abdominal hysterectomy; Rectocele  repair; Cholecystectomy; Wrist surgery; Replacement total knee (Bilateral); Back surgery; TKRX2 (Bilateral); Eye surgery; Rectocele repair; Colon surgery; Colonoscopy with propofol  (N/A, 12/05/2019); Esophagogastroduodenoscopy (egd) with propofol  (N/A, 12/05/2019); IR Radiologist Eval & Mgmt (07/30/2021); IR KYPHO LUMBAR INC FX REDUCE BONE BX UNI/BIL CANNULATION INC/IMAGING (08/12/2021); IR Radiologist Eval & Mgmt (09/05/2021); and IR Radiologist Eval & Mgmt (09/24/2021). Family: family history includes Breast cancer (age of onset: 49) in her sister.  Laboratory Chemistry Profile   Renal Lab Results  Component Value Date   BUN 14 08/12/2021   CREATININE 0.91 08/12/2021   GFRAA >60 04/20/2018   GFRNONAA >60 08/12/2021    Hepatic Lab Results  Component Value Date   AST 22 05/25/2021   ALT 15 05/25/2021   ALBUMIN 3.7 05/25/2021   ALKPHOS 72 05/25/2021   LIPASE 68 (H) 04/20/2018    Electrolytes Lab Results  Component Value Date   NA 140 08/12/2021   K 3.7 08/12/2021   CL 106 08/12/2021   CALCIUM 10.3 08/12/2021   MG 2.1 05/26/2021    Bone No results found for: VD25OH, VD125OH2TOT, ZO1096EA5, WU9811BJ4, 25OHVITD1, 25OHVITD2, 25OHVITD3, TESTOFREE, TESTOSTERONE  Inflammation (CRP: Acute Phase) (ESR: Chronic Phase) No results found for: CRP, ESRSEDRATE, LATICACIDVEN       Note: Above Lab results reviewed.  Recent Imaging Review  MM 3D SCREENING MAMMOGRAM BILATERAL BREAST CLINICAL DATA:  Screening.  EXAM: DIGITAL SCREENING BILATERAL MAMMOGRAM WITH TOMOSYNTHESIS AND CAD  TECHNIQUE: Bilateral screening digital craniocaudal and mediolateral oblique mammograms were obtained. Bilateral screening digital breast tomosynthesis was performed. The images were evaluated with computer-aided detection.  COMPARISON:  Previous exam(s).  ACR Breast Density Category b: There are scattered areas of fibroglandular density.  FINDINGS: There are no findings suspicious for  malignancy.  IMPRESSION: No mammographic evidence of malignancy. A result letter of this screening mammogram will be mailed directly to the patient.  RECOMMENDATION: Screening mammogram in one year. (Code:SM-B-01Y)  BI-RADS CATEGORY  1: Negative.  Electronically Signed   By: Alinda Apley M.D.   On: 03/06/2023 13:31 Note: Reviewed        Physical Exam  General appearance: Well nourished, well developed, and well hydrated. In no apparent acute distress Mental status: Alert, oriented x 3 (person, place, & time)       Respiratory: No evidence of acute respiratory distress Eyes: PERLA Vitals: BP (!) 140/52 (Patient Position: Sitting, Cuff Size: Normal)   Pulse (!) 52   Temp (!) 97.5 F (36.4 C) (Temporal)   Resp 18   Ht 5' 1 (1.549 m)   Wt 159 lb (72.1 kg)   LMP  (LMP Unknown)   SpO2 99%   BMI 30.04 kg/m  BMI: Estimated body mass index is 30.04 kg/m as calculated from the following:   Height as of this encounter: 5' 1 (1.549 m).   Weight as of this encounter: 159 lb (  72.1 kg). Ideal: Ideal body weight: 47.8 kg (105 lb 6.1 oz) Adjusted ideal body weight: 57.5 kg (126 lb 13.2 oz)  Assessment   Diagnosis Status  1. Chronic pain syndrome   2. Arthropathy of thoracic facet joint   3. Spinal stenosis, lumbar region, with neurogenic claudication   4. Medication management   5. Spinal stenosis, thoracic   6. Encounter for long-term opiate analgesic use   7. Compression fracture of T8 vertebra with delayed healing, subsequent encounter   8. History of kyphoplasty   9. Compression fracture of L2 vertebra, sequela    Controlled Controlled Controlled   Updated Problems: No problems updated.  Plan of Care  Problem-specific:  Assessment and Plan Started hydrocodone-acetaminophen  (Norco) 5-325 mg 1 tablet every 6 hours as needed for pain. Prescribing drug monitoring (PDMP) reviewed; findings consistent with the use of prescribed medication and no evidence of narcotic  misuse or abuse.  Urine drug screening (UDS) up to date.  Schedule follow-up in 90 days for medication management.  Ms. Harjit Greenly has a current medication list which includes the following long-term medication(s): amlodipine , levothyroxine , metoprolol  tartrate, and pantoprazole .  Pharmacotherapy (Medications Ordered): Meds ordered this encounter  Medications   oxyCODONE -acetaminophen  (PERCOCET) 5-325 MG tablet    Sig: Take 1 tablet by mouth every 6 (six) hours as needed for severe pain (pain score 7-10). Must last 30 days    Dispense:  120 tablet    Refill:  0   oxyCODONE -acetaminophen  (PERCOCET) 5-325 MG tablet    Sig: Take 1 tablet by mouth every 6 (six) hours as needed for severe pain (pain score 7-10). Must last 30 days    Dispense:  120 tablet    Refill:  0   oxyCODONE -acetaminophen  (PERCOCET) 5-325 MG tablet    Sig: Take 1 tablet by mouth every 6 (six) hours as needed for severe pain (pain score 7-10). Must last 30 days    Dispense:  120 tablet    Refill:  0   Orders:  No orders of the defined types were placed in this encounter.       Return in about 3 months (around 02/03/2024) for (F2F), (MM), Marthe Slain NP.    Recent Visits Date Type Provider Dept  09/15/23 Office Visit Clayvon Parlett K, NP [facility]  Showing recent visits within past 90 days and meeting all other requirements Today's Visits Date Type Provider Dept  11/03/23 Office Visit Linday Rhodes K, NP [facility]  Showing today's visits and meeting all other requirements Future Appointments Date Type Provider Dept  01/26/24 Appointment Jodeci Roarty K, NP [facility]  Showing future appointments within next 90 days and meeting all other requirements  I discussed the assessment and treatment plan with the patient. The patient was provided an opportunity to ask questions and all were answered. The patient agreed with the plan and demonstrated an understanding of the  instructions.  Patient advised to call back or seek an in-person evaluation if the symptoms or condition worsens.  Duration of encounter: 30 minutes.  Total time on encounter, as per AMA guidelines included both the face-to-face and non-face-to-face time personally spent by the physician and/or other qualified health care professional(s) on the day of the encounter (includes time in activities that require the physician or other qualified health care professional and does not include time in activities normally performed by clinical staff). Physician's time may include the following activities when performed: Preparing to see the patient (e.g., pre-charting review  of records, searching for previously ordered imaging, lab work, and nerve conduction tests) Review of prior analgesic pharmacotherapies. Reviewing PMP Interpreting ordered tests (e.g., lab work, imaging, nerve conduction tests) Performing post-procedure evaluations, including interpretation of diagnostic procedures Obtaining and/or reviewing separately obtained history Performing a medically appropriate examination and/or evaluation Counseling and educating the patient/family/caregiver Ordering medications, tests, or procedures Referring and communicating with other health care professionals (when not separately reported) Documenting clinical information in the electronic or other health record Independently interpreting results (not separately reported) and communicating results to the patient/ family/caregiver Care coordination (not separately reported)  Note by: Rue Tinnel K Geralene Afshar, NP (TTS and AI technology used. I apologize for any typographical errors that were not detected and corrected.) Date: 11/03/2023; Time: 11:40 AM

## 2023-11-03 NOTE — Progress Notes (Signed)
 Nursing Pain Medication Assessment:  Safety precautions to be maintained throughout the outpatient stay will include: orient to surroundings, keep bed in low position, maintain call bell within reach at all times, provide assistance with transfer out of bed and ambulation.   Medication Inspection Compliance: Pill count conducted under aseptic conditions, in front of the patient. Neither the pills nor the bottle was removed from the patient's sight at any time. Once count was completed pills were immediately returned to the patient in their original bottle.  Medication: Oxycodone /APAP Pill/Patch Count: 18 of 105 pills/patches remain Pill/Patch Appearance: Markings consistent with prescribed medication Bottle Appearance: Standard pharmacy container. Clearly labeled. Filled Date: 05 / 18 / 2025 Last Medication intake:  Today

## 2024-01-26 ENCOUNTER — Ambulatory Visit: Attending: Nurse Practitioner | Admitting: Nurse Practitioner

## 2024-01-26 ENCOUNTER — Encounter: Payer: Self-pay | Admitting: Nurse Practitioner

## 2024-01-26 VITALS — BP 145/61 | HR 82 | Temp 97.3°F | Resp 16 | Ht 61.0 in | Wt 151.0 lb

## 2024-01-26 DIAGNOSIS — M47814 Spondylosis without myelopathy or radiculopathy, thoracic region: Secondary | ICD-10-CM | POA: Diagnosis not present

## 2024-01-26 DIAGNOSIS — G894 Chronic pain syndrome: Secondary | ICD-10-CM | POA: Diagnosis present

## 2024-01-26 DIAGNOSIS — Z9889 Other specified postprocedural states: Secondary | ICD-10-CM | POA: Diagnosis present

## 2024-01-26 DIAGNOSIS — Z79899 Other long term (current) drug therapy: Secondary | ICD-10-CM | POA: Diagnosis present

## 2024-01-26 DIAGNOSIS — M48062 Spinal stenosis, lumbar region with neurogenic claudication: Secondary | ICD-10-CM | POA: Insufficient documentation

## 2024-01-26 DIAGNOSIS — M4804 Spinal stenosis, thoracic region: Secondary | ICD-10-CM | POA: Diagnosis present

## 2024-01-26 MED ORDER — OXYCODONE-ACETAMINOPHEN 5-325 MG PO TABS: 1.0000 | ORAL_TABLET | Freq: Four times a day (QID) | ORAL | 0 refills | Status: AC | PRN

## 2024-01-26 MED ORDER — OXYCODONE-ACETAMINOPHEN 5-325 MG PO TABS: 1.0000 | ORAL_TABLET | Freq: Four times a day (QID) | ORAL | 0 refills | Status: DC | PRN

## 2024-01-26 NOTE — Progress Notes (Signed)
 Nursing Pain Medication Assessment:  Safety precautions to be maintained throughout the outpatient stay will include: orient to surroundings, keep bed in low position, maintain call bell within reach at all times, provide assistance with transfer out of bed and ambulation.  Medication Inspection Compliance: Pill count conducted under aseptic conditions, in front of the patient. Neither the pills nor the bottle was removed from the patient's sight at any time. Once count was completed pills were immediately returned to the patient in their original bottle.  Medication: Oxycodone /APAP Pill/Patch Count: 71/90 Pill/Patch Appearance: Markings consistent with prescribed medication Bottle Appearance: Standard pharmacy container. Clearly labeled. Filled Date: 07 / 17 / 2025 Last Medication intake:  Today

## 2024-01-26 NOTE — Progress Notes (Signed)
 PROVIDER NOTE: Interpretation of information contained herein should be left to medically-trained personnel. Specific patient instructions are provided elsewhere under Patient Instructions section of medical record. This document was created in part using AI and STT-dictation technology, any transcriptional errors that may result from this process are unintentional.  Patient: Laura Cole  Service: E/M   PCP: Rudolpho Norleen BIRCH, MD  DOB: 1940/11/08  DOS: 01/26/2024  Provider: Emmy MARLA Blanch, NP  MRN: [id number]  Delivery: Face-to-face  Specialty: Interventional Pain Management  Type: Established Patient  Setting: Ambulatory outpatient facility  Specialty designation: 09  Referring Prov.: Rudolpho Norleen BIRCH, MD  Location: Outpatient office facility       History of present illness (HPI) [name], a 82 y.o. year old female, is here today because of her Chronic pain syndrome [G89.4]. [name] primary complain today is Back Pain (Cervical, thoracic, and lumabar  r/t scoliosis )  Pertinent problems: [name] has  Spinal stenosis, Lumbar region with neurogenic claudication; Chronic thoracic back pain; Closed fracture of dorsal (thoracic) vertebra (HCC); Compression fracture of thoracic vertebra with delayed healing; history of Kyphoplasty; Compression fracture of L2 lumbar vertebrae (HCC); Lumbar degenerative disc disease; Encounter for long-term opioid analgesics use; and Chronic pain syndrome on their pertinent problem list.   Pain Assessment: Severity of Chronic pain is reported as a 2 /10. Location: Back Upper, Mid, Lower, Right, Left/around to the belly and into left hip. Onset: More than a month ago. Quality: Discomfort, Nagging. Timing: Constant. Modifying factor(s): sitting down, sleeping,  medications. Vitals:  height is 5' 1 (1.549 m) and weight is 151 lb (68.5 kg). Her temporal temperature is 97.3 F (36.3 C) (abnormal). Her blood pressure is 145/61 (abnormal) and her pulse is 82. Her  respiration is 16 and oxygen saturation is 100%.  BMI: Estimated body mass index is 28.53 kg/m as calculated from the following:   Height as of this encounter: 5' 1 (1.549 m).   Weight as of this encounter: 151 lb (68.5 kg).  Last encounter: 11/03/2023. Last procedure: Visit date not found.  Reason for encounter: medication management. No change in medical history since last visit.  Patient's pain is at baseline.  Patient continues multimodal pain regimen as prescribed.  States that it provides pain relief and improvement in functional status.  The patient reports a decrease in hide from 69 to 61, which is associated with back pain radiating around the anterior torso and to the left hip.  She uses a wheelchair for mobility outside the home and a walker at home, relying on family assistance for transportation.  She present today with her son.  Her back pain is attributed to scoliosis, likely related to the aging process.  Pharmacotherapy Assessment   Analgesic: Oxycodone -acetaminophen  (Percocet) 5-325 mg tablet every 6 hours as needed for severe pain. MME= 26.25 Monitoring: [place] PMP: PDMP reviewed during this encounter.       Pharmacotherapy: No side-effects or adverse reactions reported. Compliance: No problems identified. Effectiveness: Clinically acceptable.  Jakie Chrissie MATSU, RN  01/26/2024 11:39 AM  Sign when Signing Visit Nursing Pain Medication Assessment:  Safety precautions to be maintained throughout the outpatient stay will include: orient to surroundings, keep bed in low position, maintain call bell within reach at all times, provide assistance with transfer out of bed and ambulation.  Medication Inspection Compliance: Pill count conducted under aseptic conditions, in front of the patient. Neither the pills nor the bottle was removed from the patient's sight at any time. Once  count was completed pills were immediately returned to the patient in their original bottle.  Medication:  Oxycodone /APAP Pill/Patch Count: 71/90 Pill/Patch Appearance: Markings consistent with prescribed medication Bottle Appearance: Standard pharmacy container. Clearly labeled. Filled Date: 07 / 17 / 2025 Last Medication intake:  Today    UDS:  Summary  Date Value Ref Range Status  09/15/2023 FINAL  Final    Comment:    ==================================================================== [employer] (MW) ==================================================================== Test                             Result       Flag       Units  Drug Present and Declared for Prescription Verification   Oxycodone                       2078         EXPECTED   ng/mg creat   Oxymorphone                    1612         EXPECTED   ng/mg creat   Noroxycodone                   2318         EXPECTED   ng/mg creat   Noroxymorphone                 644          EXPECTED   ng/mg creat    Sources of oxycodone  are scheduled prescription medications.    Oxymorphone, noroxycodone, and noroxymorphone are expected    metabolites of oxycodone . Oxymorphone is also available as a    scheduled prescription medication.  ==================================================================== Test                      Result    Flag   Units      Ref Range   Creatinine              77               mg/dL      >=79 ==================================================================== Declared Medications:  The flagging and interpretation on this report are based on the  following declared medications.  Unexpected results may arise from  inaccuracies in the declared medications.   **Note: The testing scope of this panel includes these medications:   Oxycodone  (Percocet)   **Note: The testing scope of this panel does not include the  following reported medications:   Acetaminophen  (Percocet)  Amlodipine  (Norvasc )  Levothyroxine  (Synthroid )  Metoprolol  (Lopressor )  Ondansetron  (Zofran )  Pantoprazole   (Protonix )  Polyethylene Glycol (MiraLAX) ==================================================================== For clinical consultation, please call [phone]. ====================================================================     No results found for: CBDTHCR No results found for: D8THCCBX No results found for: D9THCCBX  ROS  Constitutional: Denies any fever or chills Gastrointestinal: No reported hemesis, hematochezia, vomiting, or acute GI distress Musculoskeletal: Denies any acute onset joint swelling, redness, loss of ROM, or weakness Neurological: No reported episodes of acute onset apraxia, aphasia, dysarthria, agnosia, amnesia, paralysis, loss of coordination, or loss of consciousness  Medication Review  amLODipine , levothyroxine , ondansetron , oxyCODONE -acetaminophen , pantoprazole , and polyethylene glycol  History Review  Allergy: [name] is allergic to mushroom extract complex (obsolete), promethazine, alprazolam, cefdinir, ciprofloxacin, dilaudid [hydromorphone hcl], hydromorphone, phenergan [promethazine hcl], pravastatin, and augmentin [amoxicillin-pot clavulanate]. Drug: [name]  reports no history of drug use. Alcohol:  reports that she does not currently use alcohol. Tobacco:  reports that she has never smoked. She has never used smokeless tobacco. Social: [name]  reports that she has never smoked. She has never used smokeless tobacco. She reports that she does not currently use alcohol. She reports that she does not use drugs. Medical:  has a past medical history of Bradycardia, Cancer (HCC), Coronary artery disease, Diverticulitis, GERD (gastroesophageal reflux disease), Heart murmur, Hemorrhoids, Hypertension, Hypothyroidism, Osteoarthritis, Peripheral vascular disease (HCC), and TIA (transient ischemic attack). Surgical: [name]  has a past surgical history that includes Abdominal hysterectomy; Rectocele repair; Cholecystectomy; Wrist surgery;  Replacement total knee (Bilateral); Back surgery; TKRX2 (Bilateral); Eye surgery; Rectocele repair; Colon surgery; Colonoscopy with propofol  (N/A, 12/05/2019); Esophagogastroduodenoscopy (egd) with propofol  (N/A, 12/05/2019); IR Radiologist Eval & Mgmt (07/30/2021); IR KYPHO LUMBAR INC FX REDUCE BONE BX UNI/BIL CANNULATION INC/IMAGING (08/12/2021); IR Radiologist Eval & Mgmt (09/05/2021); and IR Radiologist Eval & Mgmt (09/24/2021). Family: family history includes Breast cancer (age of onset: 54) in her sister.  Laboratory Chemistry Profile   Renal Lab Results  Component Value Date   BUN 14 08/12/2021   CREATININE 0.91 08/12/2021   GFRAA >60 04/20/2018   GFRNONAA >60 08/12/2021    Hepatic Lab Results  Component Value Date   AST 22 05/25/2021   ALT 15 05/25/2021   ALBUMIN 3.7 05/25/2021   ALKPHOS 72 05/25/2021   LIPASE 68 (H) 04/20/2018    Electrolytes Lab Results  Component Value Date   NA 140 08/12/2021   K 3.7 08/12/2021   CL 106 08/12/2021   CALCIUM 10.3 08/12/2021   MG 2.1 05/26/2021    Bone No results found for: VD25OH, VD125OH2TOT, CI6874NY7, CI7874NY7, 25OHVITD1, 25OHVITD2, 25OHVITD3, TESTOFREE, TESTOSTERONE  Inflammation (CRP: Acute Phase) (ESR: Chronic Phase) No results found for: CRP, ESRSEDRATE, LATICACIDVEN       Note: Above Lab results reviewed.  Recent Imaging Review  MM 3D SCREENING MAMMOGRAM BILATERAL BREAST CLINICAL DATA:  Screening.  EXAM: DIGITAL SCREENING BILATERAL MAMMOGRAM WITH TOMOSYNTHESIS AND CAD  TECHNIQUE: Bilateral screening digital craniocaudal and mediolateral oblique mammograms were obtained. Bilateral screening digital breast tomosynthesis was performed. The images were evaluated with computer-aided detection.  COMPARISON:  Previous exam(s).  ACR Breast Density Category b: There are scattered areas of fibroglandular density.  FINDINGS: There are no findings suspicious for malignancy.  IMPRESSION: No  mammographic evidence of malignancy. A result letter of this screening mammogram will be mailed directly to the patient.  RECOMMENDATION: Screening mammogram in one year. (Code:[id number]-B-01Y)  BI-RADS CATEGORY  1: Negative.  Electronically Signed   By: Rosaline Collet M.D.   On: 03/06/2023 13:31 Note: Reviewed        Physical Exam  Vitals: BP (!) 145/61 (BP Location: Right Arm, Patient Position: Sitting, Cuff Size: Normal)   Pulse 82   Temp (!) 97.3 F (36.3 C) (Temporal)   Resp 16   Ht 5' 1 (1.549 m)   Wt 151 lb (68.5 kg)   LMP  (LMP Unknown)   SpO2 100%   BMI 28.53 kg/m  BMI: Estimated body mass index is 28.53 kg/m as calculated from the following:   Height as of this encounter: 5' 1 (1.549 m).   Weight as of this encounter: 151 lb (68.5 kg). Ideal: Ideal body weight: 47.8 kg (105 lb 6.1 oz) Adjusted ideal body weight: 56.1 kg (123 lb 10 oz) General appearance: Well nourished, well developed, and well hydrated. In no  apparent acute distress Mental status: Alert, oriented x 3 (person, place, & time)       Respiratory: No evidence of acute respiratory distress Eyes: PERLA   Assessment   Diagnosis Status  1. Chronic pain syndrome   2. Arthropathy of thoracic facet joint   3. Spinal stenosis, lumbar region, with neurogenic claudication   4. Medication management   5. Spinal stenosis, thoracic   6. History of kyphoplasty    Controlled Controlled Controlled   Updated Problems: Problem  Medication Management    Plan of Care  Problem-specific:  Assessment and Plan Will continue on current medication regimen.  Prescribing drug monitoring (PDMP) reviewed; findings consistent with the use of prescribed medication and no evidence of narcotic misuse or abuse.  Urine drug screening (UDS) up-to-date.  No other new issues or concerns reported at this visit.  Schedule follow-up in 90 days for medication management with Emmy Blanch, NP.   [name] has a current  medication list which includes the following long-term medication(s): amlodipine , amlodipine , levothyroxine , and pantoprazole .  Pharmacotherapy (Medications Ordered): Meds ordered this encounter  Medications   oxyCODONE -acetaminophen  (PERCOCET) 5-325 MG tablet    Sig: Take 1 tablet by mouth every 6 (six) hours as needed for severe pain (pain score 7-10). Must last 30 days    Dispense:  120 tablet    Refill:  0   oxyCODONE -acetaminophen  (PERCOCET) 5-325 MG tablet    Sig: Take 1 tablet by mouth every 6 (six) hours as needed for severe pain (pain score 7-10). Must last 30 days    Dispense:  120 tablet    Refill:  0   oxyCODONE -acetaminophen  (PERCOCET) 5-325 MG tablet    Sig: Take 1 tablet by mouth every 6 (six) hours as needed for severe pain (pain score 7-10). Must last 30 days    Dispense:  120 tablet    Refill:  0   Orders:  No orders of the defined types were placed in this encounter.       Return in about 3 months (around 04/26/2024) for (F2F), (MM), Emmy Blanch NP.    Recent Visits Date Type Provider Dept  11/03/23 Office Visit Eladio Dentremont K, NP [facility]  Showing recent visits within past 90 days and meeting all other requirements Today's Visits Date Type Provider Dept  01/26/24 Office Visit Natia Fahmy K, NP [facility]  Showing today's visits and meeting all other requirements Future Appointments Date Type Provider Dept  04/25/24 Appointment Socorro Ebron K, NP [facility]  Showing future appointments within next 90 days and meeting all other requirements  I discussed the assessment and treatment plan with the patient. The patient was provided an opportunity to ask questions and all were answered. The patient agreed with the plan and demonstrated an understanding of the instructions.  Patient advised to call back or seek an in-person evaluation if the symptoms or condition worsens.  Duration of encounter: 30 minutes.  Total time on  encounter, as per AMA guidelines included both the face-to-face and non-face-to-face time personally spent by the physician and/or other qualified health care professional(s) on the day of the encounter (includes time in activities that require the physician or other qualified health care professional and does not include time in activities normally performed by clinical staff). Physician's time may include the following activities when performed: Preparing to see the patient (e.g., pre-charting review of records, searching for previously ordered imaging, lab work, and nerve conduction tests) Review of  prior analgesic pharmacotherapies. Reviewing PMP Interpreting ordered tests (e.g., lab work, imaging, nerve conduction tests) Performing post-procedure evaluations, including interpretation of diagnostic procedures Obtaining and/or reviewing separately obtained history Performing a medically appropriate examination and/or evaluation Counseling and educating the patient/family/caregiver Ordering medications, tests, or procedures Referring and communicating with other health care professionals (when not separately reported) Documenting clinical information in the electronic or other health record Independently interpreting results (not separately reported) and communicating results to the patient/ family/caregiver Care coordination (not separately reported)  Note by: Myrna Vonseggern K Garett Tetzloff, NP (TTS and AI technology used. I apologize for any typographical errors that were not detected and corrected.) Date: 01/26/2024; Time: 11:57 AM

## 2024-02-29 ENCOUNTER — Encounter: Payer: Self-pay | Admitting: Internal Medicine

## 2024-02-29 DIAGNOSIS — Z1231 Encounter for screening mammogram for malignant neoplasm of breast: Secondary | ICD-10-CM

## 2024-03-07 ENCOUNTER — Other Ambulatory Visit: Payer: Self-pay | Admitting: Internal Medicine

## 2024-03-07 ENCOUNTER — Encounter: Payer: Self-pay | Admitting: Internal Medicine

## 2024-03-07 DIAGNOSIS — Z1231 Encounter for screening mammogram for malignant neoplasm of breast: Secondary | ICD-10-CM

## 2024-03-25 ENCOUNTER — Other Ambulatory Visit: Payer: Self-pay | Admitting: Internal Medicine

## 2024-03-25 DIAGNOSIS — Z1231 Encounter for screening mammogram for malignant neoplasm of breast: Secondary | ICD-10-CM

## 2024-03-25 DIAGNOSIS — N644 Mastodynia: Secondary | ICD-10-CM

## 2024-03-29 ENCOUNTER — Ambulatory Visit
Admission: RE | Admit: 2024-03-29 | Discharge: 2024-03-29 | Disposition: A | Discharge status: Home / Self Care | Source: Ambulatory Visit | Attending: Internal Medicine | Admitting: Internal Medicine

## 2024-03-29 DIAGNOSIS — N644 Mastodynia: Secondary | ICD-10-CM

## 2024-03-29 DIAGNOSIS — Z1231 Encounter for screening mammogram for malignant neoplasm of breast: Secondary | ICD-10-CM

## 2024-04-07 ENCOUNTER — Encounter: Payer: Self-pay | Admitting: Gastroenterology

## 2024-04-07 ENCOUNTER — Other Ambulatory Visit: Payer: Self-pay | Admitting: Gastroenterology

## 2024-04-07 DIAGNOSIS — K5900 Constipation, unspecified: Secondary | ICD-10-CM

## 2024-04-07 DIAGNOSIS — R1032 Left lower quadrant pain: Secondary | ICD-10-CM

## 2024-04-22 DIAGNOSIS — K581 Irritable bowel syndrome with constipation: Secondary | ICD-10-CM | POA: Insufficient documentation

## 2024-04-22 DIAGNOSIS — B370 Candidal stomatitis: Secondary | ICD-10-CM | POA: Insufficient documentation

## 2024-04-22 DIAGNOSIS — T07XXXA Unspecified multiple injuries, initial encounter: Secondary | ICD-10-CM | POA: Insufficient documentation

## 2024-04-22 DIAGNOSIS — R0600 Dyspnea, unspecified: Secondary | ICD-10-CM | POA: Insufficient documentation

## 2024-04-22 NOTE — Progress Notes (Signed)
 PROVIDER NOTE: Interpretation of information contained herein should be left to medically-trained personnel. Specific patient instructions are provided elsewhere under Patient Instructions section of medical record. This document was created in part using AI and STT-dictation technology, any transcriptional errors that may result from this process are unintentional.  Patient: Laura Cole  Service: E/M   PCP: Rudolpho Norleen BIRCH, MD  DOB: 11-May-1941  DOS: 04/25/2024  Provider: Emmy MARLA Blanch, NP  MRN: [id number]  Delivery: Face-to-face  Specialty: Interventional Pain Management  Type: Established Patient  Setting: Ambulatory outpatient facility  Specialty designation: 09  Referring Prov.: Rudolpho Norleen BIRCH, MD  Location: Outpatient office facility       History of present illness (HPI) [name], a 83 y.o. year old female, is here today because of her Chronic pain syndrome [G89.4]. [name] primary complain today is Back Pain  Pertinent problems: [name] has Spinal stenosis, lumbar region, with neurogenic claudication; Chronic thoracic back pain; Closed fracture of dorsal (thoracic) vertebra (HCC); Compression fracture of thoracic vertebra with delayed healing; History of kyphoplasty; Spinal stenosis, thoracic; Compression fracture of L2 lumbar vertebra (HCC); Lumbar degenerative disc disease; Medication management; Chronic pain syndrome; Pain management contract signed; H/O total knee replacement, left; and H/O total knee replacement, right on their pertinent problem list.  Pain Assessment: Severity of Chronic pain is reported as a 3 /10. Location: Back Lower/Denies. Onset: More than a month ago. Quality: Constant. Timing: Constant. Modifying factor(s): Sitting, laying down. Vitals:  height is 5' 1 (1.549 m) and weight is 152 lb (68.9 kg). Her temporal temperature is 97 F (36.1 C) (abnormal). Her blood pressure is 148/66 (abnormal) and her pulse is 57 (abnormal). Her respiration is 18 and oxygen  saturation is 100%.  BMI: Estimated body mass index is 28.72 kg/m as calculated from the following:   Height as of this encounter: 5' 1 (1.549 m).   Weight as of this encounter: 152 lb (68.9 kg).  Last encounter: 01/26/2024. Last procedure: Visit date not found.  Reason for encounter: medication management. No change in medical history since last visit.  Patient's pain is at baseline.  Patient continues multimodal pain regimen as prescribed.  States that it provides pain relief and improvement in functional status.   Discussed the use of AI scribe software for clinical note transcription with the patient, who gave verbal consent to proceed.  History of Present Illness   Laura Cole is an 83 year old female with severe osteoporosis who presents with rib and back pain after a recent injury.  She experiences rib and back pain following an incident two weeks ago when she injured herself while getting up on a medical examination table. The pain is described as excruciating when wearing certain clothing and makes it difficult for her to lay down without discomfort. She notes a change in her spine and that two ribs hurt differently than before, with pain down inside her hip bone.  Her current pain level is at a 3 out of 10, an increase from a previous level of 2. Standing for prolonged periods exacerbates the pain, and she has difficulty standing up from her recliner, requiring multiple attempts. She has not been able to stand for long periods for the past two years.  She is currently taking oxycodone  5-325 mg (Percocet) four times a day for pain management, although her medication list inaccurately reflects a three times daily dosage. She obtains her pain medication from [facility] in [place], while other medications are received  through mail order. She has experienced issues with medication reconciliation between different healthcare systems.     Pharmacotherapy Assessment   Analgesic:  Oxycodone -acetaminophen  (Percocet) 5-325 mg tablet every 6 hours as needed for severe pain. MME= 26.25 Monitoring: [place] PMP: PDMP reviewed during this encounter.       Pharmacotherapy: No side-effects or adverse reactions reported. Compliance: No problems identified. Effectiveness: Clinically acceptable.  Laura Cole, [place]  04/25/2024  1:02 PM  Sign when Signing Visit Nursing Pain Medication Assessment:  Safety precautions to be maintained throughout the outpatient stay will include: orient to surroundings, keep bed in low position, maintain call bell within reach at all times, provide assistance with transfer out of bed and ambulation.  Medication Inspection Compliance: Pill count conducted under aseptic conditions, in front of the patient. Neither the pills nor the bottle was removed from the patient's sight at any time. Once count was completed pills were immediately returned to the patient in their original bottle.  Medication: Oxycodone /APAP Pill/Patch Count: 97 of 120 pills/patches remain Pill/Patch Appearance: Markings consistent with prescribed medication Bottle Appearance: Standard pharmacy container. Clearly labeled. Filled Date: 57 / 26 / 2025 Last Medication intake:  Today    UDS:  Summary  Date Value Ref Range Status  09/15/2023 FINAL  Final    Comment:    ==================================================================== [employer] [facility] 13 (MW) ==================================================================== Test                             Result       Flag       Units  Drug Present and Declared for Prescription Verification   Oxycodone                       2078         EXPECTED   ng/mg creat   Oxymorphone                    1612         EXPECTED   ng/mg creat   Noroxycodone                   2318         EXPECTED   ng/mg creat   Noroxymorphone                 644          EXPECTED   ng/mg creat    Sources of oxycodone  are scheduled prescription medications.     Oxymorphone, noroxycodone, and noroxymorphone are expected    metabolites of oxycodone . Oxymorphone is also available as a    scheduled prescription medication.  ==================================================================== Test                      Result    Flag   Units      Ref Range   Creatinine              77               mg/dL      >=79 ==================================================================== Declared Medications:  The flagging and interpretation on this report are based on the  following declared medications.  Unexpected results may arise from  inaccuracies in the declared medications.   **Note: The testing scope of this panel includes these medications:   Oxycodone  (Percocet)   **Note: The testing scope of this panel  does not include the  following reported medications:   Acetaminophen  (Percocet)  Amlodipine  (Norvasc )  Levothyroxine  (Synthroid )  Metoprolol  (Lopressor )  Ondansetron  (Zofran )  Pantoprazole  (Protonix )  Polyethylene Glycol (MiraLAX) ==================================================================== For clinical consultation, please call [phone]. ====================================================================     No results found for: CBDTHCR No results found for: D8THCCBX No results found for: D9THCCBX  ROS  Constitutional: Denies any fever or chills Gastrointestinal: No reported hemesis, hematochezia, vomiting, or acute GI distress Musculoskeletal: Low back pain, right side rib pain Neurological: No reported episodes of acute onset apraxia, aphasia, dysarthria, agnosia, amnesia, paralysis, loss of coordination, or loss of consciousness  Medication Review  albuterol, amLODipine , levothyroxine , metoprolol  tartrate, ondansetron , oxyCODONE -acetaminophen , pantoprazole , and polyethylene glycol  History Review  Allergy: Laura Cole is allergic to mushroom extract complex (obsolete), promethazine, alprazolam,  cefdinir, ciprofloxacin, dilaudid [hydromorphone hcl], hydromorphone, phenergan [promethazine hcl], pravastatin, and augmentin [amoxicillin-pot clavulanate]. Drug: Laura Cole  reports no history of drug use. Alcohol:  reports that she does not currently use alcohol. Tobacco:  reports that she has never smoked. She has never used smokeless tobacco. Social: Laura Cole  reports that she has never smoked. She has never used smokeless tobacco. She reports that she does not currently use alcohol. She reports that she does not use drugs. Medical:  has a past medical history of Bradycardia, Cancer (HCC), Coronary artery disease, Diverticulitis, GERD (gastroesophageal reflux disease), Heart murmur, Hemorrhoids, Hypertension, Hypothyroidism, Osteoarthritis, Peripheral vascular disease, and TIA (transient ischemic attack). Surgical: Laura Cole  has a past surgical history that includes Abdominal hysterectomy; Rectocele repair; Cholecystectomy; Wrist surgery; Replacement total knee (Bilateral); Back surgery; TKRX2 (Bilateral); Eye surgery; Rectocele repair; Colon surgery; Colonoscopy with propofol  (N/A, 12/05/2019); Esophagogastroduodenoscopy (egd) with propofol  (N/A, 12/05/2019); IR Radiologist Eval & Mgmt (07/30/2021); IR KYPHO LUMBAR INC FX REDUCE BONE BX UNI/BIL CANNULATION INC/IMAGING (08/12/2021); IR Radiologist Eval & Mgmt (09/05/2021); and IR Radiologist Eval & Mgmt (09/24/2021). Family: family history includes Breast cancer (age of onset: 14) in her sister.  Laboratory Chemistry Profile   Renal Lab Results  Component Value Date   BUN 14 08/12/2021   CREATININE 0.91 08/12/2021   GFRAA >60 04/20/2018   GFRNONAA >60 08/12/2021    Hepatic Lab Results  Component Value Date   AST 22 05/25/2021   ALT 15 05/25/2021   ALBUMIN 3.7 05/25/2021   ALKPHOS 72 05/25/2021   LIPASE 68 (H) 04/20/2018    Electrolytes Lab Results  Component Value Date   NA 140 08/12/2021   K 3.7 08/12/2021   CL 106 08/12/2021    CALCIUM 10.3 08/12/2021   MG 2.1 05/26/2021    Bone No results found for: VD25OH, VD125OH2TOT, CI6874NY7, CI7874NY7, 25OHVITD1, 25OHVITD2, 25OHVITD3, TESTOFREE, TESTOSTERONE  Inflammation (CRP: Acute Phase) (ESR: Chronic Phase) No results found for: CRP, ESRSEDRATE, LATICACIDVEN       Note: Above Lab results reviewed.  Recent Imaging Review  US  LIMITED ULTRASOUND INCLUDING AXILLA RIGHT BREAST CLINICAL DATA:  Intermittent, relatively broad RIGHT breast pain with questioned lump noted by clinician at the 10 o'clock position in the RIGHT breast. Due for annual.  EXAM: DIGITAL DIAGNOSTIC BILATERAL MAMMOGRAM WITH TOMOSYNTHESIS AND CAD; ULTRASOUND RIGHT BREAST LIMITED  TECHNIQUE: Bilateral digital diagnostic mammography and breast tomosynthesis was performed. The images were evaluated with computer-aided detection. ; Targeted ultrasound examination of the right breast was performed  COMPARISON:  Previous exam(s).  ACR Breast Density Category b: There are scattered areas of fibroglandular density.  FINDINGS: Spot compression tomosynthesis views were obtained over the palpable, painful area of  concern in the RIGHT breast. No suspicious mammographic finding is identified in this area. No suspicious mass, microcalcification, or other finding is identified in EITHER breast.  On physical examination of the area of concern indicated by the patient, there is no discrete palpable abnormality. Targeted RIGHT breast ultrasound was performed in the palpable area of concern throughout the majority of the RIGHT LATERAL breast but most focally in the upper-outer quadrant of the RIGHT breast. No suspicious solid or cystic mass is identified. No etiology for pain is seen. Representative negative pictures were taken.  IMPRESSION: No mammographic or sonographic etiology for focal RIGHTbreast painidentified. There is no mammographic evidence of malignancy in EITHER  breast.  Breast pain is a common condition, which will often resolve on its own without intervention. It can be affected by hormonal changes, medication side effect, weight changes and fit of the bra. Pain may also be referred from other adjacent areas of the body. Breast pain may be improved by wearing adequate well-fitting support, over-the-counter topical and oral NSAID medication, low-fat diet, and ice/heat as needed. Studies have shown an improvement in cyclic pain with use of evening primrose oil, vitamin D and vitamin E. Recommend discussion with your doctor or other provider before starting any over the counter supplements.  Clinical follow-up recommended to discuss any further work-up recommendations and appropriate treatment, which should be based on the clinical assessment.  Findings and recommendations were discussed with the patient in person.  RECOMMENDATION: Clinical follow-up of RIGHT breast pain. Patient may return to routine screening mammogram in 1 year if clinically indicated.(Code:SM-B-01Y)  I have discussed the findings and recommendations with the patient. If applicable, a reminder letter will be sent to the patient regarding the next appointment.  BI-RADS CATEGORY  1: Negative.  Electronically Signed   By: Norleen Croak M.D.   On: 03/29/2024 15:55 MM 3D DIAGNOSTIC MAMMOGRAM BILATERAL BREAST CLINICAL DATA:  Intermittent, relatively broad RIGHT breast pain with questioned lump noted by clinician at the 10 o'clock position in the RIGHT breast. Due for annual.  EXAM: DIGITAL DIAGNOSTIC BILATERAL MAMMOGRAM WITH TOMOSYNTHESIS AND CAD; ULTRASOUND RIGHT BREAST LIMITED  TECHNIQUE: Bilateral digital diagnostic mammography and breast tomosynthesis was performed. The images were evaluated with computer-aided detection. ; Targeted ultrasound examination of the right breast was performed  COMPARISON:  Previous exam(s).  ACR Breast Density Category b: There  are scattered areas of fibroglandular density.  FINDINGS: Spot compression tomosynthesis views were obtained over the palpable, painful area of concern in the RIGHT breast. No suspicious mammographic finding is identified in this area. No suspicious mass, microcalcification, or other finding is identified in EITHER breast.  On physical examination of the area of concern indicated by the patient, there is no discrete palpable abnormality. Targeted RIGHT breast ultrasound was performed in the palpable area of concern throughout the majority of the RIGHT LATERAL breast but most focally in the upper-outer quadrant of the RIGHT breast. No suspicious solid or cystic mass is identified. No etiology for pain is seen. Representative negative pictures were taken.  IMPRESSION: No mammographic or sonographic etiology for focal RIGHTbreast painidentified. There is no mammographic evidence of malignancy in EITHER breast.  Breast pain is a common condition, which will often resolve on its own without intervention. It can be affected by hormonal changes, medication side effect, weight changes and fit of the bra. Pain may also be referred from other adjacent areas of the body. Breast pain may be improved by wearing adequate well-fitting support, over-the-counter topical and  oral NSAID medication, low-fat diet, and ice/heat as needed. Studies have shown an improvement in cyclic pain with use of evening primrose oil, vitamin D and vitamin E. Recommend discussion with your doctor or other provider before starting any over the counter supplements.  Clinical follow-up recommended to discuss any further work-up recommendations and appropriate treatment, which should be based on the clinical assessment.  Findings and recommendations were discussed with the patient in person.  RECOMMENDATION: Clinical follow-up of RIGHT breast pain. Patient may return to routine screening mammogram in 1 year if  clinically indicated.(Code:SM-B-01Y)  I have discussed the findings and recommendations with the patient. If applicable, a reminder letter will be sent to the patient regarding the next appointment.  BI-RADS CATEGORY  1: Negative.  Electronically Signed   By: Norleen Croak M.D.   On: 03/29/2024 15:55 Note: Reviewed        Physical Exam  Vitals: BP (!) 148/66 (BP Location: Right Arm, Patient Position: Sitting, Cuff Size: Normal)   Pulse (!) 57   Temp (!) 97 F (36.1 C) (Temporal)   Resp 18   Ht 5' 1 (1.549 m)   Wt 152 lb (68.9 kg)   LMP  (LMP Unknown)   SpO2 100%   BMI 28.72 kg/m  BMI: Estimated body mass index is 28.72 kg/m as calculated from the following:   Height as of this encounter: 5' 1 (1.549 m).   Weight as of this encounter: 152 lb (68.9 kg). Ideal: Ideal body weight: 47.8 kg (105 lb 6.1 oz) Adjusted ideal body weight: 56.3 kg (124 lb 0.5 oz) General appearance: Well nourished, well developed, and well hydrated. In no apparent acute distress Mental status: Alert, oriented x 3 (person, place, & time)       Respiratory: No evidence of acute respiratory distress Eyes: PERLA  Musculoskeletal: +LBP Right side rib pain Assessment   Diagnosis Status  1. Chronic pain syndrome   2. Arthropathy of thoracic facet joint   3. Spinal stenosis, lumbar region, with neurogenic claudication   4. Medication management   5. Spinal stenosis, thoracic   6. History of kyphoplasty   7. Encounter for long-term opiate analgesic use   8. Compression fracture of T8 vertebra with delayed healing, subsequent encounter    Controlled Controlled Controlled   Updated Problems: No problems updated.   Plan of Care  Problem-specific:  Assessment and Plan    Chronic pain syndrome Chronic back and rib pain, worsened by recent injury. Pain level increased to 3/10. Previous imaging unremarkable. Under spine surgeon's care for further evaluation. - Continue current pain management  regimen. - Consider x-ray if pain worsens or new symptoms develop.  Long-term opioid therapy On oxycodone  5-325 mg four times daily for pain.  - Reconciled medication list to reflect current dosing of oxycodone  5-325 mg four times daily. - Sent prescription to [facility] for pickup on December 26th. - Educated on medication management and coordination with pharmacy for timely refills.  Patient's pain is controlled with oxycodone , will continue on current medication regimen.  Prescribing drug monitoring (PDMP) reviewed, findings consistent with the use of prescribed medication and no evidence of narcotic misuse or abuse.  Urine drug screening (UDS) up to date.  No side effects or adverse reaction reported to medication.  The patient was advised to continue monitor rib pain symptoms and the pain persist then call at front desk and I will order x-ray for further evaluation.  Schedule in 90 days for medication management.  [name] has a current medication list which includes the following long-term medication(s): albuterol, amlodipine , levothyroxine , pantoprazole , and amlodipine .  Pharmacotherapy (Medications Ordered): Meds ordered this encounter  Medications   oxyCODONE -acetaminophen  (PERCOCET) 5-325 MG tablet    Sig: Take 1 tablet by mouth every 6 (six) hours as needed for severe pain (pain score 7-10). Must last 30 days    Dispense:  120 tablet    Refill:  0   oxyCODONE -acetaminophen  (PERCOCET) 5-325 MG tablet    Sig: Take 1 tablet by mouth every 6 (six) hours as needed for severe pain (pain score 7-10). Must last 30 days    Dispense:  120 tablet    Refill:  0   oxyCODONE -acetaminophen  (PERCOCET) 5-325 MG tablet    Sig: Take 1 tablet by mouth every 6 (six) hours as needed for severe pain (pain score 7-10). Must last 30 days    Dispense:  120 tablet    Refill:  0   Orders:  No orders of the defined types were placed in this encounter.       Return in about 3 months  (around 07/24/2024) for (F2F), (MM), Emmy Blanch NP.    Recent Visits Date Type Provider Dept  01/26/24 Office Visit Dajai Wahlert K, NP [facility]  Showing recent visits within past 90 days and meeting all other requirements Today's Visits Date Type Provider Dept  04/25/24 Office Visit Khyrie Masi K, NP [facility]  Showing today's visits and meeting all other requirements Future Appointments No visits were found meeting these conditions. Showing future appointments within next 90 days and meeting all other requirements  I discussed the assessment and treatment plan with the patient. The patient was provided an opportunity to ask questions and all were answered. The patient agreed with the plan and demonstrated an understanding of the instructions.  Patient advised to call back or seek an in-person evaluation if the symptoms or condition worsens.  I personally spent a total of 30 minutes in the care of the patient today including preparing to see the patient, getting/reviewing separately obtained history, performing a medically appropriate exam/evaluation, counseling and educating, placing orders, referring and communicating with other health care professionals, documenting clinical information in the EHR, independently interpreting results, communicating results, and coordinating care.   Note by: Fatim Vanderschaaf K Sheronica Corey, NP (TTS and AI technology used. I apologize for any typographical errors that were not detected and corrected.) Date: 04/25/2024; Time: 1:23 PM

## 2024-04-25 ENCOUNTER — Ambulatory Visit: Attending: Nurse Practitioner | Admitting: Nurse Practitioner

## 2024-04-25 ENCOUNTER — Encounter: Payer: Self-pay | Admitting: Nurse Practitioner

## 2024-04-25 VITALS — BP 148/66 | HR 57 | Temp 97.0°F | Resp 18 | Ht 61.0 in | Wt 152.0 lb

## 2024-04-25 DIAGNOSIS — S22060G Wedge compression fracture of T7-T8 vertebra, subsequent encounter for fracture with delayed healing: Secondary | ICD-10-CM

## 2024-04-25 DIAGNOSIS — M4804 Spinal stenosis, thoracic region: Secondary | ICD-10-CM | POA: Diagnosis not present

## 2024-04-25 DIAGNOSIS — Z9889 Other specified postprocedural states: Secondary | ICD-10-CM

## 2024-04-25 DIAGNOSIS — Z79891 Long term (current) use of opiate analgesic: Secondary | ICD-10-CM | POA: Diagnosis not present

## 2024-04-25 DIAGNOSIS — M48062 Spinal stenosis, lumbar region with neurogenic claudication: Secondary | ICD-10-CM

## 2024-04-25 DIAGNOSIS — Z79899 Other long term (current) drug therapy: Secondary | ICD-10-CM | POA: Diagnosis not present

## 2024-04-25 DIAGNOSIS — M47814 Spondylosis without myelopathy or radiculopathy, thoracic region: Secondary | ICD-10-CM | POA: Diagnosis not present

## 2024-04-25 DIAGNOSIS — G894 Chronic pain syndrome: Secondary | ICD-10-CM | POA: Diagnosis not present

## 2024-04-25 MED ORDER — OXYCODONE-ACETAMINOPHEN 5-325 MG PO TABS: 1.0000 | ORAL_TABLET | Freq: Four times a day (QID) | ORAL | 0 refills | Status: AC | PRN

## 2024-04-25 NOTE — Progress Notes (Signed)
 Nursing Pain Medication Assessment:  Safety precautions to be maintained throughout the outpatient stay will include: orient to surroundings, keep bed in low position, maintain call bell within reach at all times, provide assistance with transfer out of bed and ambulation.  Medication Inspection Compliance: Pill count conducted under aseptic conditions, in front of the patient. Neither the pills nor the bottle was removed from the patient's sight at any time. Once count was completed pills were immediately returned to the patient in their original bottle.  Medication: Oxycodone /APAP Pill/Patch Count: 97 of 120 pills/patches remain Pill/Patch Appearance: Markings consistent with prescribed medication Bottle Appearance: Standard pharmacy container. Clearly labeled. Filled Date: 43 / 26 / 2025 Last Medication intake:  Today
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
## 2024-06-02 ENCOUNTER — Encounter: Payer: Self-pay | Admitting: Neurosurgery

## 2024-06-02 ENCOUNTER — Ambulatory Visit: Admitting: Neurosurgery

## 2024-06-02 VITALS — BP 134/72 | Ht 61.0 in | Wt 152.0 lb

## 2024-06-02 DIAGNOSIS — M545 Low back pain, unspecified: Secondary | ICD-10-CM | POA: Diagnosis not present

## 2024-06-02 DIAGNOSIS — G8929 Other chronic pain: Secondary | ICD-10-CM | POA: Diagnosis not present

## 2024-06-02 DIAGNOSIS — Z8781 Personal history of (healed) traumatic fracture: Secondary | ICD-10-CM

## 2024-06-02 DIAGNOSIS — M4124 Other idiopathic scoliosis, thoracic region: Secondary | ICD-10-CM | POA: Diagnosis not present

## 2024-06-02 DIAGNOSIS — M8000XD Age-related osteoporosis with current pathological fracture, unspecified site, subsequent encounter for fracture with routine healing: Secondary | ICD-10-CM

## 2024-06-02 DIAGNOSIS — S32000S Wedge compression fracture of unspecified lumbar vertebra, sequela: Secondary | ICD-10-CM

## 2024-06-02 DIAGNOSIS — M5410 Radiculopathy, site unspecified: Secondary | ICD-10-CM

## 2024-06-02 NOTE — Progress Notes (Signed)
 "  Follow-up note: Referring Physician:  Rudolpho Norleen BIRCH, MD [address] [facility] [address] [facility] [place],  [place] [address]  Primary Physician:  Rudolpho Norleen BIRCH, MD  Chief Complaint:  f/u   History of Present Illness: 06/02/2024 Laura Cole is an 84 y.o presenting today for follow-up regarding ongoing back pain.  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  At her last visit we recommended pain management with consideration of full spinal cord stimulator versus thoracic injection. He has since been managed with pain management and is taking Percocet 5 mg every 6 hours but states that she continues to have severe pain despite this.  She feels as though she is getting shorter and that her rib cage is sitting on top of her pelvis.  She intermittently will have her painful popping in her back with movement.  While she presents in a wheelchair to clinic today, she does report that she is somewhat ambulatory at home with a rollator.  In addition to this she reports that radiates around her bra line with movement   10/30/22 Laura Cole is a 84 year old female that is well-known to our office for multiple insufficiency fractures.  She presents today at her own request after a fall a few weeks ago resulting in increased pain. She states that her pain starts in her mid back and radiates around under her breast on the right side.  This is worse with movement.  She also describes pain in her right low back and buttock that is worse with walking forwards and improves with walking backwards.  05/06/22 Laura Cole is a 84 y.o female presenting today at her request for further evaluation of persistent and worsening thoracic back pain. She was last seen on 01/14/22 and discharged.  Today she reports worsening back pain after getting into her car in November. Unfortunately it has persisted and she reports pain that radiates around her chest wall. She denies any new weakness.    01/14/22 Laura Cole is a 84 y.o. female who presents for 50-month follow-up.  She has had multiple compression fractures most recently at L2.  She reports some improvement of the severity of her pain since her last visit in June, she continues to have low back pain without radiating leg pain or lower extremity weakness.  She is currently seeing Dr. Marcelino for pain management and is scheduled with endocrinology next month.  Her PCP did start Fosamax however she is only taking 1 dose of this and decide to discontinue after researching side effects at home.  10/25/21 Laura Cole is a 84 y.o. female who presents with the chief complaint of of worsening mid back that radiates to the hips. This started about 2 weeks ago after getting off the MRI table at [facility]. Since her last visit she has been seen by IR and undergone a kyphoplasty. She had an episode of increased pain in late May after leaning to wipe herself and heard a pop with associated increased pain. Xrays at [facility] showed concern for a new L2 compression fracture. MRI from 10/16/21 confirmed an acute fracture at this level.  She states that her pain has increased over the last couple of weeks. She is not currently wearing a back brace as the last she received does not fit well and causes increase in pain. She is currently working with [facility] to obtain a better fitting brace however she needs this to be approved by her insurance first that she cannot  pay for it out-of-pocket.  2.7.23 Laura Cole is a 84 y.o. female who presents for 4 week follow up of T12 compression fracture. She continues to have severe back pain. She does describe some radiating pain into her left rib cage intermittently depending on positioning. She denies any radiating leg pain. She has not yet been fitted for TLSO brace as she does not feel like it will help. She states her pain as worsened since stopping her Tramadol. She is still not interested in considering a  kyphoplasty.  05/22/21 note by Dr. Clois Laura Cole is here today with a chief complaint of thoracic back pain that radiates to her right hip. She was seen in the emergency department after she bent over on December 19 to put on compression stockings. She had immediate onset of lower thoracic back pain. She does not have any weakness. Standing and walking make her pain worse. Sitting and medication somewhat helped. She lives independently, but this has dramatically impacted her ability to go about her day-to-day life Bowel/Bladder Dysfunction: none Conservative measures:  Physical therapy: has participated at [facility] 03/05/21-04/29/21 for muscle weakness, gait difficulty, unsteadiness Multimodal medical therapy including regular antiinflammatories: oxycodone , tylenol  Injections: has not received epidural steroid injections Past Surgery: T8 kyphoplasty Laura Cole has no symptoms of cervical myelopathy.  Review of Systems:  A 10 point review of systems is negative, and the pertinent positives and negatives detailed in the HPI.  Past Medical History: Past Medical History:  Diagnosis Date   Bradycardia    Cancer (HCC)    skin cancer   Coronary artery disease    Diverticulitis    GERD (gastroesophageal reflux disease)    Heart murmur    Hemorrhoids    Hypertension    Hypothyroidism    Osteoarthritis    Peripheral vascular disease    TIA (transient ischemic attack)     Past Surgical History: Past Surgical History:  Procedure Laterality Date   ABDOMINAL HYSTERECTOMY     BACK SURGERY     CHOLECYSTECTOMY     COLON SURGERY     COLONOSCOPY WITH PROPOFOL  N/A Laura   Procedure: COLONOSCOPY WITH PROPOFOL ;  Surgeon: Maryruth Ole DASEN, MD;  Location: [facility] ENDOSCOPY;  Service: Endoscopy;  Laterality: N/A;   ESOPHAGOGASTRODUODENOSCOPY (EGD) WITH PROPOFOL  N/A Laura   Procedure: ESOPHAGOGASTRODUODENOSCOPY (EGD) WITH PROPOFOL ;  Surgeon: Maryruth Ole DASEN, MD;  Location: [facility]  ENDOSCOPY;  Service: Endoscopy;  Laterality: N/A;   EYE SURGERY     IR KYPHO LUMBAR INC FX REDUCE BONE BX UNI/BIL CANNULATION INC/IMAGING  08/12/2021   IR RADIOLOGIST EVAL & MGMT  07/30/2021   IR RADIOLOGIST EVAL & MGMT  09/05/2021   IR RADIOLOGIST EVAL & MGMT  09/24/2021   RECTOCELE REPAIR     RECTOCELE REPAIR     REPLACEMENT TOTAL KNEE Bilateral    TKRX2 Bilateral    WRIST SURGERY      Allergies: Allergies as of 06/02/2024 - Review Complete 06/02/2024  Allergen Reaction Noted   Mushroom extract complex (obsolete) Nausea Only and Nausea And Vomiting 10/16/2015   Promethazine  01/11/2019   Alprazolam  02/25/2018   Cefdinir Nausea Only 02/25/2018   Ciprofloxacin  02/25/2018   Dilaudid [hydromorphone hcl]  04/20/2018   Hydromorphone  12/02/2019   Phenergan [promethazine hcl]  04/20/2018   Pravastatin  01/11/2019   Augmentin [amoxicillin-pot clavulanate] Rash 08/12/2021    Medications: Outpatient Encounter Medications as of 06/02/2024  Medication Sig   albuterol (VENTOLIN HFA) 108 (90 Base)  MCG/ACT inhaler Inhale 2 puffs into the lungs.   amLODipine  (NORVASC ) 2.5 MG tablet Take 2.5 mg by mouth 3 (three) times daily.   levothyroxine  (SYNTHROID ) 50 MCG tablet Take 50 mcg by mouth daily before breakfast.   metoprolol  tartrate (LOPRESSOR ) 25 MG tablet Take 12.5 mg by mouth.   ondansetron  (ZOFRAN ) 4 MG tablet Take 4 mg by mouth every 8 (eight) hours as needed.   ondansetron  (ZOFRAN -ODT) 4 MG disintegrating tablet Take 4 mg by mouth every 8 (eight) hours as needed for nausea/vomiting.   oxyCODONE -acetaminophen  (PERCOCET) 5-325 MG tablet Take 1 tablet by mouth every 6 (six) hours as needed for severe pain (pain score 7-10). Must last 30 days   [START ON 06/12/2024] oxyCODONE -acetaminophen  (PERCOCET) 5-325 MG tablet Take 1 tablet by mouth every 6 (six) hours as needed for severe pain (pain score 7-10). Must last 30 days   [START ON 07/12/2024] oxyCODONE -acetaminophen  (PERCOCET) 5-325 MG tablet  Take 1 tablet by mouth every 6 (six) hours as needed for severe pain (pain score 7-10). Must last 30 days   pantoprazole  (PROTONIX ) 20 MG tablet Take 20 mg by mouth 2 (two) times daily.   polyethylene glycol (MIRALAX / GLYCOLAX) 17 g packet Take 17 g by mouth daily as needed.   No facility-administered encounter medications on file as of 06/02/2024.    Social History: Social History   Tobacco Use   Smoking status: Never   Smokeless tobacco: Never  Vaping Use   Vaping status: Never Used  Substance Use Topics   Alcohol use: Not Currently   Drug use: Never    Family Medical History: Family History  Problem Relation Age of Onset   Breast cancer Sister 14    Exam:  General: CN II-XII grossly intact  ROM of spine: limited Palpation of spine: diffuse TTP throughout thoracolumbar spine and chest wall worse on the right than the left.  Strength MAEW  Gait remains slowed but steady with assistance of a rollator  Imaging: MR L spine 10/27/22 IMPRESSION: 1. No new acute finding in the thoracic or lumbar spine. 2. Compression deformity of the L2 vertebral body with up to approximately 70% loss of vertebral body height anteriorly and mild bony retropulsion, progressed since the prior CT from 11/07/2021. 3. Compression deformities of the T4, T8, T12, L1, and L4 vertebral bodies with post kyphoplasty changes at T8 and L1 are unchanged. 4. Bony retropulsion at T12 resulting in moderate spinal canal stenosis is unchanged. 5. Scoliotic curvature with overall mild degenerative changes without other significant spinal canal or neural foraminal stenosis.     Electronically Signed   By: Laura Harry M.D.   On: 10/27/2022 20:27  MRI T spine 10/27/22 IMPRESSION: 1. No new acute finding in the thoracic or lumbar spine. 2. Compression deformity of the L2 vertebral body with up to approximately 70% loss of vertebral body height anteriorly and mild bony retropulsion, progressed since the prior  CT from 11/07/2021. 3. Compression deformities of the T4, T8, T12, L1, and L4 vertebral bodies with post kyphoplasty changes at T8 and L1 are unchanged. 4. Bony retropulsion at T12 resulting in moderate spinal canal stenosis is unchanged. 5. Scoliotic curvature with overall mild degenerative changes without other significant spinal canal or neural foraminal stenosis.     Electronically Signed   By: Laura Harry M.D.   On: 10/27/2022 20:27  I have personally reviewed the images and agree with the above interpretation.  Assessment and Plan: Ms. Hagarty is a pleasant 84  y.o. female with history of osteoporosis and multiple compression fractures with chronic back pain.  Despite medication management, she continues to have severe and debilitating pain.  We briefly discussed other management options including a spinal [place] however she is concerned that she will not be able to undergo a percutaneous trial due to her degree of scoliosis.  She was unable to get injections for a similar reason.  She is very concerned that any intervention will cause worsening of her symptoms.  Given that she has not had any couple of years, I recommended an updated scoliosis x-ray, lumbar, and thoracic MRIs.  While I still do not feel as though she would be a surgical candidate due to her significant history osteoporosis, I would like to evaluate her for any progression. I will contact her via MyChart with the results of this imaging. She was encouraged to call our office in the interim with any questions or concerns.  She expressed understanding and was in agreement with this plan.  I spent a total of 45 minutes in both face-to-face and non-face-to-face activities for this visit on the date of this encounter including review of outside records, review of imaging, discussion of symptoms, physical exam, documentation, communication with outside providers.  Laura Goods PA-C Neurosurgery  "

## 2024-06-15 ENCOUNTER — Ambulatory Visit

## 2024-07-21 ENCOUNTER — Encounter: Admitting: Nurse Practitioner
